# Patient Record
Sex: Male | Born: 1940
Health system: Southern US, Community
[De-identification: ages and names within clinical notes are randomized; demographics above are authoritative.]

## PROBLEM LIST (undated history)

## (undated) DIAGNOSIS — I10 Essential (primary) hypertension: Secondary | ICD-10-CM

## (undated) DIAGNOSIS — R002 Palpitations: Secondary | ICD-10-CM

## (undated) DIAGNOSIS — R51 Headache: Secondary | ICD-10-CM

## (undated) DIAGNOSIS — F0390 Unspecified dementia without behavioral disturbance: Secondary | ICD-10-CM

## (undated) DIAGNOSIS — N189 Chronic kidney disease, unspecified: Secondary | ICD-10-CM

## (undated) DIAGNOSIS — Z950 Presence of cardiac pacemaker: Secondary | ICD-10-CM

## (undated) DIAGNOSIS — H7090 Unspecified mastoiditis, unspecified ear: Secondary | ICD-10-CM

## (undated) DIAGNOSIS — I209 Angina pectoris, unspecified: Secondary | ICD-10-CM

## (undated) DIAGNOSIS — M1990 Unspecified osteoarthritis, unspecified site: Secondary | ICD-10-CM

## (undated) DIAGNOSIS — I519 Heart disease, unspecified: Secondary | ICD-10-CM

## (undated) DIAGNOSIS — I499 Cardiac arrhythmia, unspecified: Secondary | ICD-10-CM

## (undated) HISTORY — PX: CYSTECTOMY: SUR359

## (undated) HISTORY — DX: Palpitations: R00.2

## (undated) HISTORY — PX: FINGER SURGERY: SHX640

## (undated) HISTORY — PX: NASAL SINUS SURGERY: SHX719

---

## 2011-04-24 ENCOUNTER — Other Ambulatory Visit: Payer: Self-pay | Admitting: Otolaryngology

## 2011-04-24 DIAGNOSIS — H7091 Unspecified mastoiditis, right ear: Secondary | ICD-10-CM

## 2011-05-02 ENCOUNTER — Ambulatory Visit
Admission: RE | Admit: 2011-05-02 | Discharge: 2011-05-02 | Disposition: A | Discharge status: Home / Self Care | Payer: Medicare HMO | Source: Ambulatory Visit | Attending: Otolaryngology | Admitting: Otolaryngology

## 2011-05-02 DIAGNOSIS — H7091 Unspecified mastoiditis, right ear: Secondary | ICD-10-CM

## 2011-05-02 MED ORDER — IOHEXOL 300 MG/ML  SOLN
75.0000 mL | Freq: Once | INTRAMUSCULAR | Status: AC | PRN
  Administered 2011-05-02: 75 mL via INTRAVENOUS

## 2011-05-13 ENCOUNTER — Encounter (HOSPITAL_COMMUNITY): Payer: Self-pay | Admitting: Pharmacy Technician

## 2011-05-15 ENCOUNTER — Encounter (HOSPITAL_COMMUNITY)
Admission: RE | Admit: 2011-05-15 | Discharge: 2011-05-15 | Disposition: A | Discharge status: Home / Self Care | Payer: Medicare HMO | Source: Ambulatory Visit | Attending: Otolaryngology | Admitting: Otolaryngology

## 2011-05-15 ENCOUNTER — Encounter (HOSPITAL_COMMUNITY): Payer: Self-pay

## 2011-05-15 ENCOUNTER — Ambulatory Visit (HOSPITAL_COMMUNITY)
Admission: RE | Admit: 2011-05-15 | Discharge: 2011-05-15 | Disposition: A | Discharge status: Home / Self Care | Payer: Medicare HMO | Source: Ambulatory Visit | Attending: Otolaryngology | Admitting: Otolaryngology

## 2011-05-15 DIAGNOSIS — I209 Angina pectoris, unspecified: Secondary | ICD-10-CM | POA: Insufficient documentation

## 2011-05-15 DIAGNOSIS — I77819 Aortic ectasia, unspecified site: Secondary | ICD-10-CM | POA: Insufficient documentation

## 2011-05-15 DIAGNOSIS — I10 Essential (primary) hypertension: Secondary | ICD-10-CM | POA: Insufficient documentation

## 2011-05-15 DIAGNOSIS — E119 Type 2 diabetes mellitus without complications: Secondary | ICD-10-CM | POA: Insufficient documentation

## 2011-05-15 DIAGNOSIS — Z01818 Encounter for other preprocedural examination: Secondary | ICD-10-CM | POA: Insufficient documentation

## 2011-05-15 HISTORY — DX: Headache: R51

## 2011-05-15 HISTORY — DX: Angina pectoris, unspecified: I20.9

## 2011-05-15 HISTORY — DX: Essential (primary) hypertension: I10

## 2011-05-15 HISTORY — DX: Unspecified osteoarthritis, unspecified site: M19.90

## 2011-05-15 HISTORY — DX: Unspecified mastoiditis, unspecified ear: H70.90

## 2011-05-15 HISTORY — DX: Cardiac arrhythmia, unspecified: I49.9

## 2011-05-15 LAB — CBC
HCT: 45.7 % (ref 39.0–52.0)
Hemoglobin: 15.5 g/dL (ref 13.0–17.0)
MCH: 26 pg (ref 26.0–34.0)
MCHC: 33.9 g/dL (ref 30.0–36.0)
MCV: 76.7 fL — ABNORMAL LOW (ref 78.0–100.0)
Platelets: 282 10*3/uL (ref 150–400)
RBC: 5.96 MIL/uL — ABNORMAL HIGH (ref 4.22–5.81)
RDW: 14.2 % (ref 11.5–15.5)
WBC: 6.7 10*3/uL (ref 4.0–10.5)

## 2011-05-15 LAB — DIFFERENTIAL
Basophils Absolute: 0 10*3/uL (ref 0.0–0.1)
Basophils Relative: 1 % (ref 0–1)
Eosinophils Absolute: 0.1 K/uL (ref 0.0–0.7)
Eosinophils Relative: 1 % (ref 0–5)
Lymphocytes Relative: 37 % (ref 12–46)
Lymphs Abs: 2.5 K/uL (ref 0.7–4.0)
Monocytes Absolute: 0.6 K/uL (ref 0.1–1.0)
Monocytes Relative: 9 % (ref 3–12)
Neutro Abs: 3.6 10*3/uL (ref 1.7–7.7)
Neutrophils Relative %: 53 % (ref 43–77)

## 2011-05-15 LAB — BASIC METABOLIC PANEL
CO2: 29 mEq/L (ref 19–32)
Calcium: 9.2 mg/dL (ref 8.4–10.5)
Chloride: 102 mEq/L (ref 96–112)
GFR calc Af Amer: >90 mL/min (ref >90)
Sodium: 140 mEq/L (ref 135–145)

## 2011-05-15 LAB — SURGICAL PCR SCREEN
MRSA, PCR: NEGATIVE
Staphylococcus aureus: NEGATIVE

## 2011-05-15 NOTE — Progress Notes (Signed)
Patient went to Dr. Einar Gip because of recent chest pain. Denies prior hx of chest pain. Stress test done on 05/09/11 and ECHO done 05/14/11. Patient states EKG was done also and that he has been cleared for OR and does not have to go back to Dr. Einar Gip until 05/22/11.   Above tests, office note and EKG requested (spoke with  Jeani Hawking) from Dr. Irven Shelling office.  Spoke with Margie Billet PA about this and she stated she did not need to see patient.  RN will review EKG/records when received to evaluate if Ebony Hail needs to see those or not.

## 2011-05-15 NOTE — Pre-Procedure Instructions (Signed)
Ricky Barnett  05/15/2011   Your procedure is scheduled on:  05/19/11  Report to [facility] at 8:30 AM.  Call this number if you have problems the morning of surgery: [phone]   Remember:   Do not eat food:After Midnight.  May have clear liquids: up to 4 Hours before arrival (4:30 AM).  Clear liquids include soda, tea, black coffee, apple or grape juice, broth.  Take these medicines the morning of surgery with A SIP OF WATER: Tamulosin, Nifedipine   Do not wear jewelry, make-up or nail polish.  Do not wear lotions, powders, or perfumes. You may wear deodorant.  Do not shave 48 hours prior to surgery.  Do not bring valuables to the hospital.  Contacts, dentures or bridgework may not be worn into surgery.  Leave suitcase in the car. After surgery it may be brought to your room.  For patients admitted to the hospital, checkout time is 11:00 AM the day of discharge.   Patients discharged the day of surgery will not be allowed to drive home.  Name and phone number of your driver: being admitted.  Special Instructions: CHG Shower Use Special Wash: 1/2 bottle night before surgery and 1/2 bottle morning of surgery.   Please read over the following fact sheets that you were given: Pain Booklet, Coughing and Deep Breathing, MRSA Information and Surgical Site Infection Prevention

## 2011-05-16 NOTE — Consult Note (Addendum)
Anesthesia:  Patient is a 71 year old male scheduled for a right mastoid tympanoplasty on 05/19/11.  History includes recent chest pain negative work-up, DM2, HTN, HLD, headaches, non-smoker, dysrhythmia.  PCP is Dr. Merrilee Seashore.  He was evaluated by Dr. Orion Crook on 05/07/11 for abnormal EKG showing SR with marked first degree AVB with history of CP.  An exercise stress test was done and was negative for ischemia and ultimately he was felt acceptable risk for this procedure.   According to Dr. Irven Shelling note, Mr. Coody also had an echo in March 2011 that showed a normal EF, mild LVH, Mild AI/TR.  I've asked our secretary to attempt to get a copy of this report as well.  Pre-op CXR showed: Aorta ectasia and findings compatible with prior granulomatous disease. No new focal or acute abnormality suggested.  Labs noted.  Plan to proceed if no acute changes.

## 2011-05-17 NOTE — H&P (Signed)
Ricky Barnett, Ricky Barnett NO.:  [id number]  MEDICAL RECORD NO.:  [id number]  LOCATION:  [facility]                          FACILITY:  [facility]  PHYSICIAN:  Minna Merritts, M.D.   DATE OF BIRTH:  1940-05-06  DATE OF ADMISSION:  05/15/2011 DATE OF DISCHARGE:  05/15/2011                             HISTORY & PHYSICAL   HISTORY OF PRESENT ILLNESS:  This patient is a 71 year old male with a history of well-controlled diabetes.  He does have hypertension, hyperlipidemia, and has a history of a right tympanic membrane perforation.  He has had a preoperatively evaluation revealed a marked first-degree AV block, and we have canceled the surgery considering for further cardiovascular evaluation.  He did have the abnormal EKG, prolonged first-degree but no ischemia.  He had a carotid Doppler on March 11, no significant carotid stenosis.  Echo on March 11, normal left ventricular status, mild left ventricular hypertrophy, mild AI and TR, hypertension 180/94, aortic regurgitation by physical examination. The patient has been evaluated by Dr. Christen Butter, and he has a normal heart rate response on a stress test on May 08, 2011.  He also has 5 minutes on the Bruce, achieved 7 METS and has been stated to have an acceptable CV risk for his right mastoid tympanoplasty.  He has had previous sinus surgery in the past in [place] and did very well, but on my examination today, his sinuses are quite good condition, but he has a very large tympanic membrane perforation with a hearing loss that is showing a 35 decibel speech reception threshold with 92% discrimination score with a primarily conductive loss typical of this tympanic membrane perforation that represents probably 70-80% of the right tympanic membrane.  The mastoid films looked quite good where they saw a normal right temporal bone except for some minimal to mild mucosal thickening and fluid in the inferior right mastoid.  There was no  soft tissue inflammation, the ossicular chain was stated to be not abnormal. He also had a normal left temporal bone.  His past diagnoses have been one of hyperlipidemia, essential hypertension, benign prostate hypertrophy, osteoarthritis, fatigue.  MEDICATIONS:  Are listed: 1. __________ 510 once a day. 2. Covastin 40 once a day. 3. Tamsulosin HCL 0.4 mg capsule 30 minutes after meals. 4. Meclizine 25 p.r.n. 2 tablets 3 times a day for vertigo. 5. He has been on Cortisporin, we switched him over to Ciprodex otic     suspension. 6. He has also been on antibiotics under my care.  We will to try to     improve this draining ear which we  have done quite well.  REVIEW OF SYSTEMS:  Showing a pressure sensation which is typical of a right tympanic membrane perforation on right ear, but he has some soreness in musculoskeletal but otherwise psychiatric, allergic, hematologic, neurologic, integument, neuro, GU, and GI are all within normal limits.  He did have an occasional vertigo which I think is from the coolness of the middle ear when he is especially using the windy environments which is quite classic for a large tympanic membrane perforation.  A small amount of the malleus was exposed.  The  CAT scan did not show this but I feel there is some of the malleus has been eroded.  There is no right mastoid pain.  The nasopharynx and oropharynx are clear.  The nose looks to be free of infection.  The larynx is clear. True cords, false cords, epiglottis, base of tongue, lateral pharyngeal walls are clear.  True cord mobility, gag reflex, tongue mobility, EOMs, facial nerve, shoulder strength are also clear, and his neck is free of any thyromegaly, cervical adenopathy, or mass.  She is oriented x3. Chest is clear.  No rales, rhonchi, or wheezes.  His chest x-ray showed some aortic ectasia with findings compatible of prior granulomatous disease.  No new focal abnormalities shown.   Bony structures also shows some degenerative osteophytosis mid to lower thoracic spine.  From the diagnosis here is one of the  right tympanic membrane perforation with concern of right mastoiditis, history of primary AV block, history of hyperlipidemia, essential hypertension of 150/90 on my exam, benign prostatic hypertrophy, and osteoarthritis, history of sinusitis, history of aortic regurgitation and that has been cleared of cardiac abnormalities of meriting a reasonable risk for general endotracheal anesthesia.          ______________________________ Minna Merritts, M.D.     JC/MEDQ  D:  05/16/2011  T:  05/16/2011  Job:  [id number]  cc:   Laverda Page, MD Merrilee Seashore, M.D.

## 2011-05-19 ENCOUNTER — Other Ambulatory Visit: Payer: Self-pay

## 2011-05-19 ENCOUNTER — Ambulatory Visit (HOSPITAL_COMMUNITY)
Admission: RE | Admit: 2011-05-19 | Discharge: 2011-05-20 | Disposition: A | Discharge status: Home / Self Care | Payer: Medicare HMO | Source: Ambulatory Visit | Attending: Otolaryngology | Admitting: Otolaryngology

## 2011-05-19 ENCOUNTER — Encounter (HOSPITAL_COMMUNITY): Payer: Self-pay | Admitting: Vascular Surgery

## 2011-05-19 ENCOUNTER — Encounter (HOSPITAL_COMMUNITY)
Admission: RE | Disposition: A | Discharge status: Home / Self Care | Payer: Self-pay | Source: Ambulatory Visit | Attending: Otolaryngology

## 2011-05-19 ENCOUNTER — Encounter (HOSPITAL_COMMUNITY): Payer: Self-pay | Admitting: Surgery

## 2011-05-19 ENCOUNTER — Ambulatory Visit (HOSPITAL_COMMUNITY): Payer: Medicare HMO | Admitting: Vascular Surgery

## 2011-05-19 ENCOUNTER — Encounter (HOSPITAL_COMMUNITY): Payer: Self-pay

## 2011-05-19 DIAGNOSIS — H7090 Unspecified mastoiditis, unspecified ear: Secondary | ICD-10-CM | POA: Insufficient documentation

## 2011-05-19 DIAGNOSIS — E119 Type 2 diabetes mellitus without complications: Secondary | ICD-10-CM | POA: Insufficient documentation

## 2011-05-19 DIAGNOSIS — I499 Cardiac arrhythmia, unspecified: Secondary | ICD-10-CM | POA: Insufficient documentation

## 2011-05-19 DIAGNOSIS — E785 Hyperlipidemia, unspecified: Secondary | ICD-10-CM | POA: Insufficient documentation

## 2011-05-19 DIAGNOSIS — I10 Essential (primary) hypertension: Secondary | ICD-10-CM | POA: Insufficient documentation

## 2011-05-19 DIAGNOSIS — R51 Headache: Secondary | ICD-10-CM | POA: Insufficient documentation

## 2011-05-19 DIAGNOSIS — H902 Conductive hearing loss, unspecified: Secondary | ICD-10-CM | POA: Insufficient documentation

## 2011-05-19 DIAGNOSIS — Z23 Encounter for immunization: Secondary | ICD-10-CM | POA: Insufficient documentation

## 2011-05-19 DIAGNOSIS — H7290 Unspecified perforation of tympanic membrane, unspecified ear: Secondary | ICD-10-CM | POA: Insufficient documentation

## 2011-05-19 DIAGNOSIS — I440 Atrioventricular block, first degree: Secondary | ICD-10-CM | POA: Insufficient documentation

## 2011-05-19 HISTORY — PX: TYMPANOPLASTY: SHX33

## 2011-05-19 LAB — GLUCOSE, CAPILLARY
Glucose-Capillary: 108 mg/dL — ABNORMAL HIGH (ref 70–99)
Glucose-Capillary: 135 mg/dL — ABNORMAL HIGH (ref 70–99)
Glucose-Capillary: 125 mg/dL — ABNORMAL HIGH (ref 70–99)

## 2011-05-19 SURGERY — TYMPANOPLASTY
Anesthesia: General | Site: Ear | Laterality: Right | Wound class: Clean Contaminated

## 2011-05-19 MED ORDER — PROMETHAZINE HCL 25 MG/ML IJ SOLN: 6.2500 mg | INTRAMUSCULAR | Status: DC | PRN

## 2011-05-19 MED ORDER — FENTANYL CITRATE 0.05 MG/ML IJ SOLN
INTRAMUSCULAR | Status: DC | PRN
  Administered 2011-05-19: 100 ug via INTRAVENOUS

## 2011-05-19 MED ORDER — CEFAZOLIN SODIUM 1-5 GM-% IV SOLN
1.0000 g | Freq: Three times a day (TID) | INTRAVENOUS | Status: DC
  Administered 2011-05-19 – 2011-05-20 (×3): 1 g via INTRAVENOUS
  Filled 2011-05-19 (×5): qty 50

## 2011-05-19 MED ORDER — EPHEDRINE SULFATE 50 MG/ML IJ SOLN
INTRAMUSCULAR | Status: DC | PRN
  Administered 2011-05-19 (×2): 5 mg via INTRAVENOUS

## 2011-05-19 MED ORDER — ACETAMINOPHEN 325 MG PO TABS
325.0000 mg | ORAL_TABLET | ORAL | Status: DC | PRN
  Administered 2011-05-20: 650 mg via ORAL
  Filled 2011-05-19 (×2): qty 2

## 2011-05-19 MED ORDER — EPINEPHRINE HCL (NASAL) 0.1 % NA SOLN
NASAL | Status: DC | PRN
  Administered 2011-05-19: 2 [drp] via TOPICAL

## 2011-05-19 MED ORDER — MIDAZOLAM HCL 2 MG/2ML IJ SOLN: 0.5000 mg | Freq: Once | INTRAMUSCULAR | Status: DC | PRN

## 2011-05-19 MED ORDER — CEFAZOLIN SODIUM 1-5 GM-% IV SOLN
INTRAVENOUS | Status: DC | PRN
  Administered 2011-05-19: 1 g via INTRAVENOUS

## 2011-05-19 MED ORDER — SODIUM CHLORIDE 0.9 % IR SOLN: Status: DC | PRN

## 2011-05-19 MED ORDER — CIPROFLOXACIN-DEXAMETHASONE 0.3-0.1 % OT SUSP
OTIC | Status: DC | PRN
  Administered 2011-05-19: 2 [drp] via OTIC

## 2011-05-19 MED ORDER — BACIT-POLY-NEO HC 1 % EX OINT
TOPICAL_OINTMENT | CUTANEOUS | Status: DC | PRN
  Administered 2011-05-19: 1

## 2011-05-19 MED ORDER — MEPERIDINE HCL 25 MG/ML IJ SOLN: 6.2500 mg | INTRAMUSCULAR | Status: DC | PRN

## 2011-05-19 MED ORDER — LISINOPRIL 20 MG PO TABS
20.0000 mg | ORAL_TABLET | Freq: Every day | ORAL | Status: DC
  Administered 2011-05-19 – 2011-05-20 (×2): 20 mg via ORAL
  Filled 2011-05-19 (×2): qty 1

## 2011-05-19 MED ORDER — ACETAMINOPHEN-CODEINE 120-12 MG/5ML PO SOLN
15.0000 mL | ORAL | Status: DC | PRN
  Administered 2011-05-19: 15 mL via ORAL

## 2011-05-19 MED ORDER — METHYLENE BLUE 1 % INJ SOLN
INTRAMUSCULAR | Status: DC | PRN
  Administered 2011-05-19: 1 mL

## 2011-05-19 MED ORDER — SODIUM CHLORIDE 0.9 % IV SOLN
INTRAVENOUS | Status: DC
  Administered 2011-05-19: 15:00:00 via INTRAVENOUS

## 2011-05-19 MED ORDER — HEMOSTATIC AGENTS (NO CHARGE) OPTIME
TOPICAL | Status: DC | PRN
  Administered 2011-05-19: 1 via TOPICAL

## 2011-05-19 MED ORDER — INFLUENZA VIRUS VACC SPLIT PF IM SUSP
0.5000 mL | Freq: Once | INTRAMUSCULAR | Status: AC
  Administered 2011-05-20: 0.5 mL via INTRAMUSCULAR
  Filled 2011-05-19 (×2): qty 0.5

## 2011-05-19 MED ORDER — SODIUM CHLORIDE 0.9 % IR SOLN
Status: DC | PRN
  Administered 2011-05-19: 1000 mL

## 2011-05-19 MED ORDER — ONDANSETRON HCL 4 MG/2ML IJ SOLN: 4.0000 mg | INTRAMUSCULAR | Status: DC | PRN

## 2011-05-19 MED ORDER — CEFAZOLIN SODIUM 1-5 GM-% IV SOLN
INTRAVENOUS | Status: AC
  Filled 2011-05-19: qty 50

## 2011-05-19 MED ORDER — KETOROLAC TROMETHAMINE 30 MG/ML IJ SOLN
15.0000 mg | Freq: Once | INTRAMUSCULAR | Status: AC | PRN
  Administered 2011-05-19: 30 mg via INTRAVENOUS
  Filled 2011-05-19: qty 1

## 2011-05-19 MED ORDER — CLINDAMYCIN HCL 150 MG PO CAPS
150.0000 mg | ORAL_CAPSULE | Freq: Three times a day (TID) | ORAL | Status: DC
  Administered 2011-05-19 – 2011-05-20 (×4): 150 mg via ORAL
  Filled 2011-05-19 (×6): qty 1

## 2011-05-19 MED ORDER — ONDANSETRON HCL 4 MG/2ML IJ SOLN
INTRAMUSCULAR | Status: DC | PRN
  Administered 2011-05-19: 4 mg via INTRAVENOUS

## 2011-05-19 MED ORDER — LIDOCAINE-EPINEPHRINE 1 %-1:100000 IJ SOLN
INTRAMUSCULAR | Status: DC | PRN
  Administered 2011-05-19: 3.8 mL

## 2011-05-19 MED ORDER — FENTANYL CITRATE 0.05 MG/ML IJ SOLN: 25.0000 ug | INTRAMUSCULAR | Status: DC | PRN

## 2011-05-19 MED ORDER — PROPOFOL 10 MG/ML IV EMUL
INTRAVENOUS | Status: DC | PRN
  Administered 2011-05-19: 200 mg via INTRAVENOUS

## 2011-05-19 MED ORDER — TAMSULOSIN HCL 0.4 MG PO CAPS
0.4000 mg | ORAL_CAPSULE | Freq: Every day | ORAL | Status: DC
  Administered 2011-05-19: 0.4 mg via ORAL
  Filled 2011-05-19 (×2): qty 1

## 2011-05-19 MED ORDER — LACTATED RINGERS IV SOLN
INTRAVENOUS | Status: DC | PRN
  Administered 2011-05-19 (×2): via INTRAVENOUS

## 2011-05-19 MED ORDER — ONDANSETRON HCL 4 MG PO TABS: 4.0000 mg | ORAL_TABLET | ORAL | Status: DC | PRN

## 2011-05-19 MED ORDER — GLYCOPYRROLATE 0.2 MG/ML IJ SOLN
INTRAMUSCULAR | Status: DC | PRN
  Administered 2011-05-19: 0.1 mg via INTRAVENOUS

## 2011-05-19 MED ORDER — LIDOCAINE HCL (CARDIAC) 20 MG/ML IV SOLN
INTRAVENOUS | Status: DC | PRN
  Administered 2011-05-19: 60 mg via INTRAVENOUS

## 2011-05-19 MED ORDER — BENAZEPRIL HCL 10 MG PO TABS
10.0000 mg | ORAL_TABLET | Freq: Every day | ORAL | Status: DC
  Administered 2011-05-19 – 2011-05-20 (×2): 10 mg via ORAL
  Filled 2011-05-19 (×2): qty 1

## 2011-05-19 MED ORDER — NIFEDIPINE ER OSMOTIC RELEASE 90 MG PO TB24
90.0000 mg | ORAL_TABLET | Freq: Every day | ORAL | Status: DC
  Administered 2011-05-19 – 2011-05-20 (×2): 90 mg via ORAL
  Filled 2011-05-19 (×2): qty 1

## 2011-05-19 SURGICAL SUPPLY — 46 items
BANDAGE CONFORM 3  STR LF (GAUZE/BANDAGES/DRESSINGS) IMPLANT
CANISTER SUCTION 2500CC (MISCELLANEOUS) ×2 IMPLANT
CLOTH BEACON ORANGE TIMEOUT ST (SAFETY) ×2 IMPLANT
COTTONBALL LRG STERILE PKG (GAUZE/BANDAGES/DRESSINGS) ×2 IMPLANT
COVER SURGICAL LIGHT HANDLE (MISCELLANEOUS) ×2 IMPLANT
DECANTER SPIKE VIAL GLASS SM (MISCELLANEOUS) ×2 IMPLANT
DRAPE EENT ADH APERT 31X51 STR (DRAPES) ×2 IMPLANT
DRAPE MICROSCOPE LEICA 54X105 (DRAPE) ×2 IMPLANT
DRAPE POUCH INSTRU U-SHP 10X18 (DRAPES) ×2 IMPLANT
ELECT COATED BLADE 2.86 ST (ELECTRODE) ×2 IMPLANT
ELECT REM PT RETURN 9FT ADLT (ELECTROSURGICAL) ×2
ELECTRODE REM PT RTRN 9FT ADLT (ELECTROSURGICAL) ×1 IMPLANT
GLOVE BIO SURGEON STRL SZ 6 (GLOVE) ×2 IMPLANT
GLOVE BIOGEL PI IND STRL 6.5 (GLOVE) ×1 IMPLANT
GLOVE BIOGEL PI INDICATOR 6.5 (GLOVE) ×1
GLOVE SURG SS PI 6.5 STRL IVOR (GLOVE) ×2 IMPLANT
GLOVE SURG SS PI 7.5 STRL IVOR (GLOVE) ×2 IMPLANT
GOWN PREVENTION PLUS XLARGE (GOWN DISPOSABLE) ×2 IMPLANT
GOWN STRL NON-REIN LRG LVL3 (GOWN DISPOSABLE) ×2 IMPLANT
KIT BASIN OR (CUSTOM PROCEDURE TRAY) ×2 IMPLANT
KIT ROOM TURNOVER OR (KITS) ×2 IMPLANT
NEEDLE 25GX 5/8IN NON SAFETY (NEEDLE) ×2 IMPLANT
NEEDLE 27GAX1X1/2 (NEEDLE) ×2 IMPLANT
NEEDLE HYPO 25GX1X1/2 BEV (NEEDLE) ×2 IMPLANT
NS IRRIG 1000ML POUR BTL (IV SOLUTION) ×2 IMPLANT
PAD ARMBOARD 7.5X6 YLW CONV (MISCELLANEOUS) ×4 IMPLANT
PENCIL BUTTON HOLSTER BLD 10FT (ELECTRODE) ×2 IMPLANT
SPECIMEN JAR SMALL (MISCELLANEOUS) ×2 IMPLANT
SPONGE GAUZE 4X4 12PLY (GAUZE/BANDAGES/DRESSINGS) IMPLANT
SPONGE SURGIFOAM ABS GEL 12-7 (HEMOSTASIS) ×2 IMPLANT
STAPLER VISISTAT 35W (STAPLE) IMPLANT
STRIP CLOSURE SKIN 1/2X4 (GAUZE/BANDAGES/DRESSINGS) ×2 IMPLANT
SUT CHROMIC 2 0 SH (SUTURE) IMPLANT
SUT CHROMIC 3 0 PS 2 (SUTURE) ×2 IMPLANT
SUT CHROMIC 3 0 SH 27 (SUTURE) ×2 IMPLANT
SUT ETHILON 5 0 P 3 18 (SUTURE)
SUT NYLON ETHILON 5-0 P-3 1X18 (SUTURE) IMPLANT
SYR 3ML LL SCALE MARK (SYRINGE) ×2 IMPLANT
SYR TB 1ML LUER SLIP (SYRINGE) ×4 IMPLANT
TOWEL OR 17X24 6PK STRL BLUE (TOWEL DISPOSABLE) ×2 IMPLANT
TOWEL OR 17X26 10 PK STRL BLUE (TOWEL DISPOSABLE) ×2 IMPLANT
TRAY ENT MC OR (CUSTOM PROCEDURE TRAY) ×2 IMPLANT
TUBING EXTENTION W/L.L. (IV SETS) IMPLANT
TUBING IRRIGATION (MISCELLANEOUS) IMPLANT
WATER STERILE IRR 1000ML POUR (IV SOLUTION) IMPLANT
WIPE INSTRUMENT VISIWIPE 73X73 (MISCELLANEOUS) ×2 IMPLANT

## 2011-05-19 NOTE — Progress Notes (Signed)
Pt admitted to 5152 from PACU. Dx: S/P right tympanoplasty. Oriented to room. Call bell in reach. IV fluids infusing. Wife at bedside. Dr. Ernesto Rutherford in to evaluate drainage from right ear. Noted small of cotton ball soaked with serous drainage. Clean cotton ball replaced. Ice pack to operative side.

## 2011-05-19 NOTE — Progress Notes (Signed)
EKG results faxed to Dr Einar Gip.

## 2011-05-19 NOTE — Anesthesia Procedure Notes (Signed)
Procedure Name: LMA Insertion Date/Time: 05/19/2011 7:43 AM Performed by: Neldon Newport Pre-anesthesia Checklist: Emergency Drugs available, Patient identified, Timeout performed, Suction available and Patient being monitored Patient Re-evaluated:Patient Re-evaluated prior to inductionOxygen Delivery Method: Circle system utilized Preoxygenation: Pre-oxygenation with 100% oxygen Intubation Type: IV induction Ventilation: Mask ventilation without difficulty LMA: LMA inserted LMA Size: 5.0 Placement Confirmation: positive ETCO2 and breath sounds checked- equal and bilateral Tube secured with: Tape Dental Injury: Teeth and Oropharynx as per pre-operative assessment

## 2011-05-19 NOTE — Op Note (Signed)
NAME:  Ricky Barnett, Ricky Barnett NO.:  [id number]  MEDICAL RECORD NO.:  [id number]  LOCATION:  [facility]                         FACILITY:  [facility]  PHYSICIAN:  Minna Merritts, M.D.   DATE OF BIRTH:  [birth year]  DATE OF PROCEDURE: DATE OF DISCHARGE:                              OPERATIVE REPORT   The patient has a severe right tympanic membrane perforation representing 80-90% of his tympanic membrane with a conductive hearing loss that is showing a 35 dB speech reception threshold with more frequencies at 250 get down to 50 dB and at 1000 showing 40 dB loss with a 92% discrimination score.  His CAT scan showed some mastoid changes in the tail or tip of the mastoid inferiorly but showed no evidence of any cholesteatomatous debris.  Our plan is permission for a mastoid tympanoplasty on the right, but hopefully we can accomplish our goal of doing a tympanoplasty we do not find cholesteatoma in the attic.  He is aware of the risks and gains, aware that he can wake up with some vertigo or have facial nerve issues considering the amount of damage seen on this middle ear.  His tympanic membrane is about 90% absent but the ossicular chain appears to be intact on CAT scan and on physical examination.  PREOPERATIVE DIAGNOSIS:  Right mastoiditis with tympanic membrane perforation 90% with probable ossicular chain intact.  POSTOPERATIVE DIAGNOSIS:  Right mastoiditis with tympanic membrane perforation 90% with probable ossicular chain intact.  OPERATION:  Right exploratory tympanotomy with tympanoplasty.  We were able to avoid the mastoid.  OPERATOR:  Minna Merritts, MD  ANESTHESIA:  General endotracheal with Dr. Leonie Green.  PROCEDURE:  The patient was placed in supine position under general endotracheal anesthesia.  The head was turned to the left.  The right ear being marked and the patient was prepped with Betadine x3.  The usual head drape was used and ear drape, and then  again we cleansed the ear very carefully under the microscope after prepping and draping completely.  The tympanic membrane was essentially absent except around the malleus, and we were able to first evaluate the external ear canal. We decided that there was so much damage here that we would not do a typical exploratory tympanotomy, but that we would pull the what was left of the canal wall skin laterally and toward [place] in an effort to save as much as we possibly could, and therefore in a circumferential area right from the posterior canal wall we retracted the skin towards [place] rolling it back exposing the posterior canal wall.  We did this inferiorly as well.  As we worked around anteriorly, we could see anteriorly quite well and retracted this skin also laterally.  We then came to the scutum anterior portion where the ossicular chain was covered with what was left of the tympanic membrane, and we were able to tease this back very carefully.  We watched the backside of what was left of the skin but we did not see any cholesteatoma up in the attic and antrum region.  We cleaned back this what was left of the tympanic membrane back to the head of  the malleus and back to the entire incus and found that the ossicular chain was in fact intact.  The umbo of the malleus had been somewhat eroded but the major portion was still intact. The middle ear appeared to be in quite decent condition.  The mucous membrane was in reasonably good condition.  We felt with the damage extensive as it was we would use every bit of that canal wall skin to support the temporalis fascia graft.  The fact that we did not find a cholesteatoma up by the attic and antrum region or in the posterior facial recess region, we felt that we did not have to do any further resection in that area.  He did have the SIMS monitor on as a safety factor.  We then did a postauricular incision and took a fairly large temporalis fascia  graft, and then after hemostasis was established with Bovie electrocoagulation, the closure was with 3-0 chromic catgut.  This graft was then sized and adjusted.  A large piece of Gelfoam was placed within the middle ear to support the graft but not to push it beyond the level of the malleus.  We then folded the graft carefully in an effort to place it into the middle ear location without pushing any of the retracted canal wall skin down under the graft.  We were able to place the graft all away around the circumferential area of the external ear canal up the wall of the anterior canal draping over the malleus up over the scutum and posteriorly.  We then placed the canal wall skin that was retracted back all over and back down over the grafted area.  Once this was completed, we felt this was an excellent approximation, the tympanic membrane being intact this is now the new temporalis graft, and the posterior, anterior, inferior and superior canal wall skin was back in its original position.  Once this was completed, Gelfoam again with Ciprodex drops were then placed in the external ear canal followed by Neosporin ointment followed by cotton in the external ear canal, and the patient has done very well as being awakened.  He had a first-degree heart block and did go into second-degree heart block.  There was no problem with his blood pressure or pulse.  He then once the anesthesia was discontinued, the patient returned back to first-degree heart block. He will be kept on a 23 hour observation but has done extremely well.          ______________________________ Minna Merritts, M.D.     JC/MEDQ  D:  05/19/2011  T:  05/19/2011  Job:  [id number]  cc:   Laverda Page, MD Merrilee Seashore, M.D.

## 2011-05-19 NOTE — Preoperative (Signed)
Beta Blockers   Reason not to administer Beta Blockers:Not Applicable 

## 2011-05-19 NOTE — Progress Notes (Signed)
Dr. Nadyne Coombes in to see patient. Aware of plan to transfer to telemetry. Aware of EKG results.

## 2011-05-19 NOTE — H&P (Signed)
  Dictation [phone]

## 2011-05-19 NOTE — Progress Notes (Signed)
Post op check patient doing well for otology status                       However  I reviewed EKG with Dr Nadyne Coombes who is                   Concerned about continued second degree heart block  Plan to transfer to telemetry

## 2011-05-19 NOTE — Anesthesia Postprocedure Evaluation (Signed)
Anesthesia Post Note  Patient: Ricky Barnett  Procedure(s) Performed: Procedure(s) (LRB): TYMPANOPLASTY (Right)  Anesthesia type: GA  Patient location: PACU  Post pain: Pain level controlled  Post assessment: Post-op Vital signs reviewed  Last Vitals:  Filed Vitals:   05/19/11 0945  BP: 142/83  Pulse: 87  Temp:   Resp: 24    Post vital signs: Reviewed  Level of consciousness: sedated  Complications: No apparent anesthesia complications   Intra operative dysrythmia observed.  Patient stable through entire case.  Second degree Mobitz 1 found on post op EKG.  Patient stable.  Discussed with surgeon who plans to relay information to cardiologist for further recommendations.

## 2011-05-19 NOTE — Anesthesia Preprocedure Evaluation (Addendum)
Anesthesia Evaluation  Patient identified by MRN, date of birth, ID band Patient awake    Airway Mallampati: II TM Distance: >3 FB Neck ROM: full    Dental  (+) Dental Advidsory Given and Loose   Pulmonary          Cardiovascular hypertension, Pt. on medications and On Medications + dysrhythmias     Neuro/Psych  Headaches,    GI/Hepatic   Endo/Other  Diabetes mellitus-  Renal/GU      Musculoskeletal   Abdominal   Peds  Hematology   Anesthesia Other Findings Loose upper in back teeth Stress test and echo reviewed  Reproductive/Obstetrics                          Anesthesia Physical Anesthesia Plan  ASA: II  Anesthesia Plan: General LMA   Post-op Pain Management:    Induction:   Airway Management Planned:   Additional Equipment:   Intra-op Plan:   Post-operative Plan:   Informed Consent:   Dental Advisory Given  Plan Discussed with: Anesthesiologist, CRNA and Surgeon  Anesthesia Plan Comments:        Anesthesia Quick Evaluation

## 2011-05-19 NOTE — Progress Notes (Signed)
Dr Glennon Mac and Dr Ernesto Rutherford at bedside to evaluate EKG.  Dr Ernesto Rutherford will consult with pt's cardiologist re 2nd degree type 1  HB.  Will wait for further orders.

## 2011-05-19 NOTE — Progress Notes (Signed)
Noted that CBG was not performed on pt in pre-op.  Notified Rhonda in OR the need for CBG.  She verbalized understanding and stated she would let staff know.//L. Delena Casebeer,RN

## 2011-05-19 NOTE — Progress Notes (Signed)
Dr Ernesto Rutherford at bedside.

## 2011-05-19 NOTE — Progress Notes (Signed)
Report called to Department 2000, Vikki Ports RN. Questions answered. Informed of recent EKG.

## 2011-05-19 NOTE — Plan of Care (Signed)
Problem: Consults Goal: Diabetes Guidelines if Diabetic/Glucose > 140 If diabetic or lab glucose is > 140 mg/dl - Initiate Diabetes/Hyperglycemia Guidelines & Document Interventions  Outcome: Completed/Met Date Met:  05/19/11 CBG 125

## 2011-05-19 NOTE — Op Note (Signed)
Dictated [phone]

## 2011-05-19 NOTE — Consult Note (Signed)
CARDIOLOGY CONSULT NOTE  Patient ID: Ricky Barnett MRN: [id number] DOB/AGE: 1940-03-06 71 y.o.  Admit date: 05/19/2011 Referring Physician Lorane Gell, MD Primary Physician: No primary provider on file. Reason for Consultation: Heart block  HPI: This patient is a 70 year old male with a history of well-controlled diabetes. He does have hypertension, hyperlipidemia, and has a history of a right tympanic membrane perforation. He has had a preoperatively evaluation revealed a marked first-degree AV block,and underwent outpatient cardiovascular evaluation. He did have the abnormal EKG,prolonged first-degree but no ischemia. He had a carotid Doppler on March 11, no significant carotid stenosis. Echo on March 11, normal left ventricularEF, mild left ventricular hypertrophy, mild AI,TR, pulmonary hypertension,  And aortic regurgitation by echo and physical exam.  physical examination. He has a normal heart rate response on a stress test on May 08, 2011. He also has 5 minutes on the Bruce, achieved 7 METS and has been stated to have an acceptable CV risk for his right mastoid tympanoplasty.  He underwent surgery today and did well, but post operartive telemetry and EKG showed high degree AV block and I was conculted to see the patient. Essentially asymptomatic and wife at the bedside.   Past Medical History  Diagnosis Date  . Mastoiditis     right ear  . Hypertension   . Dysrhythmia     2-3 yrs ago  . Diabetes mellitus     was at one time lost weight amd no problem with it now. About 2-3 yrs ago  . Headache     occas  . Arthritis     bil knees  . Angina     just started recently ,2-3 days ago-went to see Dr. Einar Gip 05/14/2011     Past Surgical History  Procedure Date  . Nasal sinus surgery     left side  . Cystectomy     under chin 1983  . Finger surgery     states he cut finger off when cutting wood     History reviewed. No pertinent family history.  Social History: History    Social History  . Marital Status: Married    Spouse Name: N/A    Number of Children: N/A  . Years of Education: N/A   Occupational History  . Not on file.   Social History Main Topics  . Smoking status: Never Smoker   . Smokeless tobacco: Never Used  . Alcohol Use: No  . Drug Use: No  . Sexually Active:    Other Topics Concern  . Not on file   Social History Narrative  . No narrative on file     Prescriptions prior to admission  Medication Sig Dispense Refill  . benazepril (LOTENSIN) 10 MG tablet Take 10 mg by mouth daily.      . ciprofloxacin-dexamethasone (CIPRODEX) otic suspension Place 4 drops into the right ear 2 (two) times daily.       . clindamycin (CLEOCIN) 150 MG capsule Take 150 mg by mouth 3 (three) times daily. Take for 10 days. rx. filled 05/02/11      . lisinopril (PRINIVIL,ZESTRIL) 20 MG tablet Take 20 mg by mouth daily.      . meclizine (ANTIVERT) 25 MG tablet Take 25-50 mg by mouth 3 (three) times daily as needed. For dizziness      . neomycin-polymyxin-hydrocortisone (CORTISPORIN) otic solution Place 4 drops into the right ear 4 (four) times daily.       Marland Kitchen NIFEdipine (PROCARDIA XL/ADALAT-CC) 90 MG 24 hr  tablet Take 90 mg by mouth daily.      . Tamsulosin HCl (FLOMAX) 0.4 MG CAPS Take 0.4 mg by mouth daily after supper.        ROS: General: no fevers/chills/night sweats Eyes: no blurry vision, diplopia, or amaurosis ENT: no sore throat or hearing loss Resp: no cough, wheezing, or hemoptysis CV: no edema or palpitations GI: no abdominal pain, nausea, vomiting, diarrhea, or constipation GU: no dysuria, frequency, or hematuria Skin: no rash Neuro: no headache, numbness, tingling, or weakness of extremities Musculoskeletal: no joint pain or swelling Heme: no bleeding, DVT, or easy bruising Endo: no polydipsia or polyuria    Physical Exam: Blood pressure 146/70, pulse 56, temperature 98.1 F (36.7 C), temperature source Oral, resp. rate 18, height 5'  11" (1.803 m), weight 106.641 kg (235 lb 1.6 oz), SpO2 100.00%.  Pt is alert and oriented, WD, WN, in no distress. HEENT: normal Neck: JVP normal. Carotid upstrokes normal without bruits. No thyromegaly. Lungs: equal expansion, clear bilaterally CV: Apex is discrete and nondisplaced,S1, S2 normal, EDM heard in the right parasternal border. Abd: soft, NT, +BS, no bruit, no hepatosplenomegaly Back: no CVA tenderness Ext: no C/C/E        Femoral pulses 2+= without bruits        DP/PT pulses intact and = Skin: warm and dry without rash Neuro: CNII-XII intact             Strength intact = bilaterally  Labs:   Lab Results  Component Value Date   WBC 6.7 05/15/2011   HGB 15.5 05/15/2011   HCT 45.7 05/15/2011   MCV 76.7* 05/15/2011   PLT 282 05/15/2011    Lab 05/15/11 1130  NA 140  K 3.8  CL 102  CO2 29  BUN 10  CREATININE 0.95  CALCIUM 9.2  PROT --  BILITOT --  ALKPHOS --  ALT --  AST --  GLUCOSE 137*      Radiology: No results found.  EKG: Complete Heart block with accelerated junctional escape.  ASSESSMENT AND PLAN:  1. Complete heart block. Patient will be transferred to the telemetry and watched overnight and if the AV conduction is not resolved, I will admit him and probably will need Permanent Transvenous pacemaker implant. I will keep NPO in the morning if we decide to proceed with pacemaker implantation.  Patient is not on any negative chronotropic agents.  Laverda Page, MD 05/19/2011, 8:20 PM

## 2011-05-19 NOTE — Transfer of Care (Signed)
Immediate Anesthesia Transfer of Care Note  Patient: Ricky Barnett  Procedure(s) Performed: Procedure(s) (LRB): TYMPANOPLASTY (Right)  Patient Location: PACU  Anesthesia Type: General  Level of Consciousness: awake and alert   Airway & Oxygen Therapy: Patient Spontanous Breathing  Post-op Assessment: Report given to PACU RN, Post -op Vital signs reviewed and stable and Patient moving all extremities  Post vital signs: Reviewed and stable  Complications: No apparent anesthesia complications

## 2011-05-19 NOTE — Progress Notes (Signed)
Call placed to Dr Ernesto Rutherford Re dng noted from ear canal.  No orders received.

## 2011-05-20 ENCOUNTER — Encounter (HOSPITAL_COMMUNITY): Payer: Self-pay | Admitting: Otolaryngology

## 2011-05-20 NOTE — Progress Notes (Signed)
Pt discharge instructions and patient education complete. IV site d/c. Site WNL. No s/s of distress. D/C home with wife. Ricky Barnett

## 2011-05-20 NOTE — Discharge Summary (Signed)
CARDIOLOGY CONSULT NOTE  Patient ID: MATHEO AMORY MRN: [id number] DOB/AGE: 03/20/1940 71 y.o.  Admit date: 05/19/2011 Referring Physician Lorane Gell, MD Primary Physician: No primary provider on file. Reason for Consultation: Heart block  HPI: This patient is a 71 year old male with a history of well-controlled diabetes. He does have hypertension, hyperlipidemia, and has a history of a right tympanic membrane perforation. He has had a preoperatively evaluation revealed a marked first-degree AV block,and underwent outpatient cardiovascular evaluation. He did have the abnormal EKG,prolonged first-degree but no ischemia. He had a carotid Doppler on March 11, no significant carotid stenosis. Echo on March 11, normal left ventricularEF, mild left ventricular hypertrophy, mild AI,TR, pulmonary hypertension,  And aortic regurgitation by echo and physical exam.  physical examination. He has a normal heart rate response on a stress test on May 08, 2011. He also has 5 minutes on the Bruce, achieved 7 METS and has been stated to have an acceptable CV risk for his right mastoid tympanoplasty.  He underwent surgery today and did well, but post operartive telemetry and EKG showed high degree AV block and I was conculted to see the patient. Essentially asymptomatic and wife at the bedside.   The following morning patient was essentially asymptomatic. After careful evaluation of the rhythm strips and EKG it was found that patient had developed Wenckebach type AV block. There was no high degree AV block. On telemetry patient had very transient Mobitz type II AV block a very early morning hours when patient was asleep at around 4:00. Hence it was felt not to be significant. I also consulted Dr. Thompson Grayer curbside and felt it was safe for the patient be discharged home. I discussed with Dr. Lorane Gell and he wanted me to discharge the patient. He will follow the patient up in the outpatient basis. I will see the  patient as previously scheduled for follow up off first-degree heart block. Patient has maintained first-degree heart block this morning with a PR interval of close to 400 ms.  Past Medical History  Diagnosis Date  . Mastoiditis     right ear  . Hypertension   . Dysrhythmia     2-3 yrs ago  . Diabetes mellitus     was at one time lost weight amd no problem with it now. About 2-3 yrs ago  . Headache     occas  . Arthritis     bil knees  . Angina     just started recently ,2-3 days ago-went to see Dr. Einar Gip 05/14/2011     Past Surgical History  Procedure Date  . Nasal sinus surgery     left side  . Cystectomy     under chin 1983  . Finger surgery     states he cut finger off when cutting wood     History reviewed. No pertinent family history.  Social History: History   Social History  . Marital Status: Married    Spouse Name: N/A    Number of Children: N/A  . Years of Education: N/A   Occupational History  . Not on file.   Social History Main Topics  . Smoking status: Never Smoker   . Smokeless tobacco: Never Used  . Alcohol Use: No  . Drug Use: No  . Sexually Active:    Other Topics Concern  . Not on file   Social History Narrative  . No narrative on file     Prescriptions prior to admission  Medication Sig  Dispense Refill  . benazepril (LOTENSIN) 10 MG tablet Take 10 mg by mouth daily.      . ciprofloxacin-dexamethasone (CIPRODEX) otic suspension Place 4 drops into the right ear 2 (two) times daily.       . clindamycin (CLEOCIN) 150 MG capsule Take 150 mg by mouth 3 (three) times daily. Take for 10 days. rx. filled 05/02/11      . lisinopril (PRINIVIL,ZESTRIL) 20 MG tablet Take 20 mg by mouth daily.      . meclizine (ANTIVERT) 25 MG tablet Take 25-50 mg by mouth 3 (three) times daily as needed. For dizziness      . neomycin-polymyxin-hydrocortisone (CORTISPORIN) otic solution Place 4 drops into the right ear 4 (four) times daily.       Marland Kitchen NIFEdipine  (PROCARDIA XL/ADALAT-CC) 90 MG 24 hr tablet Take 90 mg by mouth daily.      . Tamsulosin HCl (FLOMAX) 0.4 MG CAPS Take 0.4 mg by mouth daily after supper.        ROS: General: no fevers/chills/night sweats Eyes: no blurry vision, diplopia, or amaurosis ENT: no sore throat or hearing loss Resp: no cough, wheezing, or hemoptysis CV: no edema or palpitations GI: no abdominal pain, nausea, vomiting, diarrhea, or constipation GU: no dysuria, frequency, or hematuria Skin: no rash Neuro: no headache, numbness, tingling, or weakness of extremities Musculoskeletal: no joint pain or swelling Heme: no bleeding, DVT, or easy bruising Endo: no polydipsia or polyuria    Physical Exam: Blood pressure 130/80, pulse 65, temperature 98.1 F (36.7 C), temperature source Oral, resp. rate 18, height 5\' 11"  (1.803 m), weight 106.641 kg (235 lb 1.6 oz), SpO2 96.00%.  Pt is alert and oriented, WD, WN, in no distress. HEENT: normal Neck: JVP normal. Carotid upstrokes normal without bruits. No thyromegaly. Lungs: equal expansion, clear bilaterally CV: Apex is discrete and nondisplaced,S1, S2 normal, EDM heard in the right parasternal border. Abd: soft, NT, +BS, no bruit, no hepatosplenomegaly Back: no CVA tenderness Ext: no C/C/E        Femoral pulses 2+= without bruits        DP/PT pulses intact and = Skin: warm and dry without rash Neuro: CNII-XII intact             Strength intact = bilaterally  Labs:   Lab Results  Component Value Date   WBC 6.7 05/15/2011   HGB 15.5 05/15/2011   HCT 45.7 05/15/2011   MCV 76.7* 05/15/2011   PLT 282 05/15/2011     Lab 05/15/11 1130  NA 140  K 3.8  CL 102  CO2 29  BUN 10  CREATININE 0.95  CALCIUM 9.2  PROT --  BILITOT --  ALKPHOS --  ALT --  AST --  GLUCOSE 137*      Radiology: No results found.  EKG: Mobitz Type I AV block. Complete heart block noted yesterday was an error  ASSESSMENT AND PLAN:  1. 1 st degree AV block is his underlying  rhythm and had periodic Mobitz Type II AV block. Asymptomatic. D/W Thompson Grayer. OK to discharge patient.   I will see him as scheduled in a couple weeks.  Patient is not on any negative chronotropic agents.  Laverda Page, MD 05/20/2011, 9:12 AM

## 2011-05-20 NOTE — Consult Note (Signed)
CARDIOLOGY CONSULT NOTE  Patient ID: Ricky Barnett MRN: [id number] DOB/AGE: 1940-09-24 71 y.o.  Admit date: 05/19/2011 Referring Physician Lorane Gell, MD Primary Physician: No primary provider on file. Reason for Consultation: Heart block  HPI: This patient is a 71 year old male with a history of well-controlled diabetes. He does have hypertension, hyperlipidemia, and has a history of a right tympanic membrane perforation. He has had a preoperatively evaluation revealed a marked first-degree AV block,and underwent outpatient cardiovascular evaluation. He did have the abnormal EKG,prolonged first-degree but no ischemia. He had a carotid Doppler on March 11, no significant carotid stenosis. Echo on March 11, normal left ventricularEF, mild left ventricular hypertrophy, mild AI,TR, pulmonary hypertension,  And aortic regurgitation by echo and physical exam.  physical examination. He has a normal heart rate response on a stress test on May 08, 2011. He also has 5 minutes on the Bruce, achieved 7 METS and has been stated to have an acceptable CV risk for his right mastoid tympanoplasty.  He underwent surgery today and did well, but post operartive telemetry and EKG showed high degree AV block and I was conculted to see the patient. Essentially asymptomatic and wife at the bedside.   Past Medical History  Diagnosis Date  . Mastoiditis     right ear  . Hypertension   . Dysrhythmia     2-3 yrs ago  . Diabetes mellitus     was at one time lost weight amd no problem with it now. About 2-3 yrs ago  . Headache     occas  . Arthritis     bil knees  . Angina     just started recently ,2-3 days ago-went to see Dr. Einar Gip 05/14/2011     Past Surgical History  Procedure Date  . Nasal sinus surgery     left side  . Cystectomy     under chin 1983  . Finger surgery     states he cut finger off when cutting wood     History reviewed. No pertinent family history.  Social History: History    Social History  . Marital Status: Married    Spouse Name: N/A    Number of Children: N/A  . Years of Education: N/A   Occupational History  . Not on file.   Social History Main Topics  . Smoking status: Never Smoker   . Smokeless tobacco: Never Used  . Alcohol Use: No  . Drug Use: No  . Sexually Active:    Other Topics Concern  . Not on file   Social History Narrative  . No narrative on file     Prescriptions prior to admission  Medication Sig Dispense Refill  . benazepril (LOTENSIN) 10 MG tablet Take 10 mg by mouth daily.      . ciprofloxacin-dexamethasone (CIPRODEX) otic suspension Place 4 drops into the right ear 2 (two) times daily.       . clindamycin (CLEOCIN) 150 MG capsule Take 150 mg by mouth 3 (three) times daily. Take for 10 days. rx. filled 05/02/11      . lisinopril (PRINIVIL,ZESTRIL) 20 MG tablet Take 20 mg by mouth daily.      . meclizine (ANTIVERT) 25 MG tablet Take 25-50 mg by mouth 3 (three) times daily as needed. For dizziness      . neomycin-polymyxin-hydrocortisone (CORTISPORIN) otic solution Place 4 drops into the right ear 4 (four) times daily.       Marland Kitchen NIFEdipine (PROCARDIA XL/ADALAT-CC) 90 MG 24 hr  tablet Take 90 mg by mouth daily.      . Tamsulosin HCl (FLOMAX) 0.4 MG CAPS Take 0.4 mg by mouth daily after supper.        ROS: General: no fevers/chills/night sweats Eyes: no blurry vision, diplopia, or amaurosis ENT: no sore throat or hearing loss Resp: no cough, wheezing, or hemoptysis CV: no edema or palpitations GI: no abdominal pain, nausea, vomiting, diarrhea, or constipation GU: no dysuria, frequency, or hematuria Skin: no rash Neuro: no headache, numbness, tingling, or weakness of extremities Musculoskeletal: no joint pain or swelling Heme: no bleeding, DVT, or easy bruising Endo: no polydipsia or polyuria    Physical Exam: Blood pressure 130/80, pulse 65, temperature 98.1 F (36.7 C), temperature source Oral, resp. rate 18, height 5'  11" (1.803 m), weight 106.641 kg (235 lb 1.6 oz), SpO2 96.00%.  Pt is alert and oriented, WD, WN, in no distress. HEENT: normal Neck: JVP normal. Carotid upstrokes normal without bruits. No thyromegaly. Lungs: equal expansion, clear bilaterally CV: Apex is discrete and nondisplaced,S1, S2 normal, EDM heard in the right parasternal border. Abd: soft, NT, +BS, no bruit, no hepatosplenomegaly Back: no CVA tenderness Ext: no C/C/E        Femoral pulses 2+= without bruits        DP/PT pulses intact and = Skin: warm and dry without rash Neuro: CNII-XII intact             Strength intact = bilaterally  Labs:   Lab Results  Component Value Date   WBC 6.7 05/15/2011   HGB 15.5 05/15/2011   HCT 45.7 05/15/2011   MCV 76.7* 05/15/2011   PLT 282 05/15/2011     Lab 05/15/11 1130  NA 140  K 3.8  CL 102  CO2 29  BUN 10  CREATININE 0.95  CALCIUM 9.2  PROT --  BILITOT --  ALKPHOS --  ALT --  AST --  GLUCOSE 137*      Radiology: No results found.  EKG: Mobitz Type I AV block. Complete heart block noted yesterday was an error  ASSESSMENT AND PLAN:  1. 1 st degree AV block is his underlying rhythm and had periodic Mobitz Type II AV block. Asymptomatic. D/W Thompson Grayer. OK to discharge patient.   I will see him as scheduled in a couple weeks.  Patient is not on any negative chronotropic agents.  Laverda Page, MD 05/20/2011, 7:56 AM

## 2011-05-21 LAB — GLUCOSE, CAPILLARY: Glucose-Capillary: 102 mg/dL — ABNORMAL HIGH (ref 70–99)

## 2012-10-18 ENCOUNTER — Ambulatory Visit (INDEPENDENT_AMBULATORY_CARE_PROVIDER_SITE_OTHER): Payer: Medicare Other | Admitting: Cardiovascular Disease

## 2012-10-18 ENCOUNTER — Encounter: Payer: Self-pay | Admitting: Cardiovascular Disease

## 2012-10-18 VITALS — BP 142/84 | HR 78 | Ht 71.0 in | Wt 247.0 lb

## 2012-10-18 DIAGNOSIS — E119 Type 2 diabetes mellitus without complications: Secondary | ICD-10-CM

## 2012-10-18 DIAGNOSIS — I440 Atrioventricular block, first degree: Secondary | ICD-10-CM

## 2012-10-18 DIAGNOSIS — R079 Chest pain, unspecified: Secondary | ICD-10-CM

## 2012-10-18 DIAGNOSIS — R002 Palpitations: Secondary | ICD-10-CM

## 2012-10-18 DIAGNOSIS — I10 Essential (primary) hypertension: Secondary | ICD-10-CM

## 2012-10-18 DIAGNOSIS — E785 Hyperlipidemia, unspecified: Secondary | ICD-10-CM

## 2012-10-18 NOTE — Assessment & Plan Note (Addendum)
Patient has risk factors including hypertension, hypokalemia and diabetes. He had substernal chest pressure lasting 10-15 minutes at times it several times a week for the last several weeks. He had a brother who died in his early 62s of a myocardial infarction. Going to get a pharmacologic Myoview stress test to rule out an ischemic etiology. He does have a long first degree AV block.

## 2012-10-18 NOTE — Assessment & Plan Note (Signed)
Well-controlled on current medications 

## 2012-10-18 NOTE — Assessment & Plan Note (Signed)
Patient relates episodes of palpitations lasting up to 2 hours at time of the last several weeks. I'm going to get a 30 day event monitor as well as a 2-D echocardiogram

## 2012-10-18 NOTE — Patient Instructions (Addendum)
  We will see you back in follow up after testing.   Dr Gwenlyn Found has ordered a 30 day event monitor, lexiscan myoview, and an echocardiogram  Your physician has requested that you have an echocardiogram. Echocardiography is a painless test that uses sound waves to create images of your heart. It provides your doctor with information about the size and shape of your heart and how well your heart's chambers and valves are working. This procedure takes approximately one hour. There are no restrictions for this procedure.  Your physician has requested that you have a lexiscan myoview. For further information please visit [email or web address]. Please follow instruction sheet, as given.

## 2012-10-18 NOTE — Progress Notes (Signed)
10/18/2012 Ricky Barnett   [birth year]  [id number]  Primary Physician No primary provider on file. Primary Cardiologist: Lorretta Harp MD Renae Gloss   HPI:  Ricky Barnett is a 72 year old moderately overweight married African American male father of 46, grandfather to 54 grandchildren referred to me by Dr. Ashby Dawes for evaluation of palpitations and chest pain. His cardiac risk factor profile is positive for hypertension, diabetes and hyperlipidemia. He did have a brother who died at age 71 of a myocardial infarction. He has never had a cardiac or stroke. He does complain of substernal chest pressure lasting up to 15 minutes at a time occurring 3-4 times in the last several weeks which is new for him. He also complains of episodes of tachycardia palpitations. He was evaluated by Dr. Gaylyn Cheers she 05/19/11 for preoperative clearance for surgical correction of a right tympanic membrane perforation. He had a Bruce protocol exercise stress test which time he achieved 7 metastases. He did have a prolonged first degree AV block at that time.   Current Outpatient Prescriptions  Medication Sig Dispense Refill  . amLODipine-benazepril (LOTREL) 5-20 MG per capsule Take 1 capsule by mouth daily.       Marland Kitchen aspirin 81 MG tablet Take 81 mg by mouth daily.      . metFORMIN (GLUCOPHAGE-XR) 750 MG 24 hr tablet 750 mg 2 (two) times daily.       . pravastatin (PRAVACHOL) 40 MG tablet Take 40 mg by mouth daily.       . Tamsulosin HCl (FLOMAX) 0.4 MG CAPS Take 0.4 mg by mouth daily after supper.       No current facility-administered medications for this visit.    No Known Allergies  History   Social History  . Marital Status: Married    Spouse Name: N/A    Number of Children: N/A  . Years of Education: N/A   Occupational History  . Not on file.   Social History Main Topics  . Smoking status: Never Smoker   . Smokeless tobacco: Never Used  . Alcohol Use: No  . Drug Use: No  . Sexual  Activity:    Other Topics Concern  . Not on file   Social History Narrative  . No narrative on file     Review of Systems: General: negative for chills, fever, night sweats or weight changes.  Cardiovascular: negative for chest pain, dyspnea on exertion, edema, orthopnea, palpitations, paroxysmal nocturnal dyspnea or shortness of breath Dermatological: negative for rash Respiratory: negative for cough or wheezing Urologic: negative for hematuria Abdominal: negative for nausea, vomiting, diarrhea, bright red blood per rectum, melena, or hematemesis Neurologic: negative for visual changes, syncope, or dizziness All other systems reviewed and are otherwise negative except as noted above.    Blood pressure 142/84, pulse 78, height 5\' 11"  (1.803 m), weight 247 lb (112.038 kg).  General appearance: alert and no distress Neck: no adenopathy, no carotid bruit, no JVD, supple, symmetrical, trachea midline and thyroid not enlarged, symmetric, no tenderness/mass/nodules Lungs: clear to auscultation bilaterally Heart: regular rate and rhythm, S1, S2 normal, no murmur, click, rub or gallop Abdomen: soft, non-tender; bowel sounds normal; no masses,  no organomegaly Extremities: extremities normal, atraumatic, no cyanosis or edema Pulses: 2+ and symmetric  EKG normal sinus rhythm at 78 with a prolonged first degree AV block and a PR interval of 406 ms  ASSESSMENT AND PLAN:   Chest pain Patient has risk factors including hypertension, hypokalemia and diabetes. He had  substernal chest pressure lasting 10-15 minutes at times it several times a week for the last several weeks. He had a brother who died in his early 28s of a myocardial infarction. Going to get a pharmacologic Myoview stress test to rule out an ischemic etiology. He does have a long first degree AV block.  Palpitations Patient relates episodes of palpitations lasting up to 2 hours at time of the last several weeks. I'm going to get  a 30 day event monitor as well as a 2-D echocardiogram  Essential hypertension Well-controlled on current medications      Lorretta Harp MD [facility], [facility] 10/18/2012 11:17 AM

## 2012-10-19 ENCOUNTER — Telehealth: Payer: Self-pay | Admitting: Cardiovascular Disease

## 2012-10-19 DIAGNOSIS — R002 Palpitations: Secondary | ICD-10-CM

## 2012-10-19 NOTE — Telephone Encounter (Signed)
Unable to get the monitor on . Have some questions please call..  Thanks

## 2012-10-19 NOTE — Telephone Encounter (Signed)
Returned call and spoke w/ pt.  Stated he has been having a hard time getting the monitor on.  Stated he will call back tomorrow morning if he can't get it on b/c he'd rather come in and get someone to put it on him.  Pt informed he can call back in the morning since he is driving and an appt can be schedule to set up his monitor.  Pt verbalized understanding and agreed w/ plan.

## 2012-10-19 NOTE — Telephone Encounter (Signed)
Returned call.  Left message to call back before 4pm.  

## 2012-10-20 NOTE — Telephone Encounter (Signed)
Returned call. Pt stated they were able to get the monitor hooked up and haven't had any problems w/ it.  Advised he call back or call [facility] w/ any problems.  Pt verbalized understanding and agreed w/ plan.

## 2012-10-21 ENCOUNTER — Telehealth: Payer: Self-pay | Admitting: Cardiovascular Disease

## 2012-10-21 NOTE — Telephone Encounter (Signed)
Per [facility], urgent report of severe bradycardia: HR 39 lasting 37 secs.

## 2012-10-21 NOTE — Telephone Encounter (Signed)
i spoke with patient and informed him that we would like to start him out on the treadmill for his myoview.  He is agreeable.

## 2012-10-21 NOTE — Telephone Encounter (Signed)
Called pt, he stated he was up and about at the time of the arrythmia. No chest pain, no lightheadedness.  On one strip his HR was 33.  This is Mobitz 1.  Discussed with Dr. Ellyn Hack and pt will need  Stress myoview to eval chronotropic incompetence, with the awareness it will need to be switched to [facility] as we doubt his HR will increase.  He is on no meds to cause bradycardia.

## 2012-10-21 NOTE — Telephone Encounter (Signed)
**  Correction**: HR 41 lasting 37 secs.  Serita Butcher, NP notified.

## 2012-10-22 ENCOUNTER — Other Ambulatory Visit (HOSPITAL_COMMUNITY): Payer: Self-pay | Admitting: Cardiovascular Disease

## 2012-10-22 DIAGNOSIS — R079 Chest pain, unspecified: Secondary | ICD-10-CM

## 2012-10-25 ENCOUNTER — Telehealth: Payer: Self-pay | Admitting: Cardiovascular Disease

## 2012-10-25 ENCOUNTER — Ambulatory Visit (INDEPENDENT_AMBULATORY_CARE_PROVIDER_SITE_OTHER): Payer: Medicare Other | Admitting: Cardiology

## 2012-10-25 ENCOUNTER — Encounter: Payer: Self-pay | Admitting: Cardiology

## 2012-10-25 VITALS — BP 136/90 | HR 96 | Ht 71.0 in | Wt 245.0 lb

## 2012-10-25 DIAGNOSIS — I471 Supraventricular tachycardia: Secondary | ICD-10-CM | POA: Insufficient documentation

## 2012-10-25 DIAGNOSIS — I441 Atrioventricular block, second degree: Secondary | ICD-10-CM | POA: Insufficient documentation

## 2012-10-25 DIAGNOSIS — R079 Chest pain, unspecified: Secondary | ICD-10-CM

## 2012-10-25 DIAGNOSIS — I498 Other specified cardiac arrhythmias: Secondary | ICD-10-CM

## 2012-10-25 NOTE — Assessment & Plan Note (Signed)
Patient has had 2 episodes of Mobitz 1 with heart rates to 39. Initially patient was awake and asymptomatic, most recent episode patient was asleep but asymptomatic upon waking by phone call.  Monitor originally placed for symptoms of tachycardia.

## 2012-10-25 NOTE — Assessment & Plan Note (Signed)
Here in office EKG with junct tach with retrograde p wave.

## 2012-10-25 NOTE — Assessment & Plan Note (Signed)
For stress test this week or next.  He has occ episode brief chest discomfort.

## 2012-10-25 NOTE — Patient Instructions (Addendum)
Call us if lightheadedness or dizziness, or any passing out go to ER.  Re-schedule your study for next week.  Heart Healthy diet.  Cardiac Diet This diet can help prevent heart disease and stroke. Many factors influence your heart health, including eating and exercise habits. Coronary risk rises a lot with abnormal blood fat (lipid) levels. Cardiac meal planning includes limiting unhealthy fats, increasing healthy fats, and making other small dietary changes. General guidelines are as follows:  Adjust calorie intake to reach and maintain desirable body weight.  Limit total fat intake to less than 30% of total calories. Saturated fat should be less than 7% of calories.  Saturated fats are found in animal products and in some vegetable products. Saturated vegetable fats are found in coconut oil, cocoa butter, palm oil, and palm kernel oil. Read labels carefully to avoid these products as much as possible. Use butter in moderation. Choose tub margarines and oils that have 2 grams of fat or less. Good cooking oils are canola and olive oils.  Practice low-fat cooking techniques. Do not fry food. Instead, broil, bake, boil, steam, grill, roast on a rack, stir-fry, or microwave it. Other fat reducing suggestions include:  Remove the skin from poultry.  Remove all visible fat from meats.  Skim the fat off stews, soups, and gravies before serving them.  Steam vegetables in water or broth instead of sauting them in fat.  Avoid foods with trans fat (or hydrogenated oils), such as commercially fried foods and commercially baked goods. Commercial shortening and deep-frying fats will contain trans fat.  Increase intake of fruits, vegetables, whole grains, and legumes to replace foods high in fat.  Increase consumption of nuts, legumes, and seeds to at least 4 servings weekly. One serving of a legume equals  cup, and 1 serving of nuts or seeds equals  cup.  Choose whole grains more often. Have 3  servings per day (a serving is 1 ounce [oz]).  Eat 4 to 5 servings of vegetables per day. A serving of vegetables is 1 cup of raw leafy vegetables;  cup of raw or cooked cut-up vegetables;  cup of vegetable juice.  Eat 4 to 5 servings of fruit per day. A serving of fruit is 1 medium whole fruit;  cup of dried fruit;  cup of fresh, frozen, or canned fruit;  cup of 100% fruit juice.  Increase your intake of dietary fiber to 20 to 30 grams per day. Insoluble fiber may help lower your risk of heart disease and may help curb your appetite.  Soluble fiber binds cholesterol to be removed from the blood. Foods high in soluble fiber are dried beans, citrus fruits, oats, apples, bananas, broccoli, Brussels sprouts, and eggplant.  Try to include foods fortified with plant sterols or stanols, such as yogurt, breads, juices, or margarines. Choose several fortified foods to achieve a daily intake of 2 to 3 grams of plant sterols or stanols.  Foods with omega-3 fats can help reduce your risk of heart disease. Aim to have a 3.5 oz portion of fatty fish twice per week, such as salmon, mackerel, albacore tuna, sardines, lake trout, or herring. If you wish to take a fish oil supplement, choose one that contains 1 gram of both DHA and EPA.  Limit processed meats to 2 servings (3 oz portion) weekly.  Limit the sodium in your diet to 1500 milligrams (mg) per day. If you have high blood pressure, talk to a registered dietitian about a DASH (Dietary Approaches  to Stop Hypertension) eating plan.  Limit sweets and beverages with added sugar, such as soda, to no more than 5 servings per week. One serving is:   1 tablespoon sugar.  1 tablespoon jelly or jam.   cup sorbet.  1 cup lemonade.   cup regular soda. CHOOSING FOODS Starches  Allowed: Breads: All kinds (wheat, rye, raisin, white, oatmeal, [place], [place], and English muffin bread). Low-fat rolls: English muffins, frankfurter and hamburger buns,  bagels, pita bread, tortillas (not fried). Pancakes, waffles, biscuits, and muffins made with recommended oil.  Avoid: Products made with saturated or trans fats, oils, or whole milk products. Butter rolls, cheese breads, croissants. Commercial doughnuts, muffins, sweet rolls, biscuits, waffles, pancakes, store-bought mixes. Crackers  Allowed: Low-fat crackers and snacks: Animal, graham, rye, saltine (with recommended oil, no lard), oyster, and matzo crackers. Bread sticks, melba toast, rusks, flatbread, pretzels, and light popcorn.  Avoid: High-fat crackers: cheese crackers, butter crackers, and those made with coconut, palm oil, or trans fat (hydrogenated oils). Buttered popcorn. Cereals  Allowed: Hot or cold whole-grain cereals.  Avoid: Cereals containing coconut, hydrogenated vegetable fat, or animal fat. Potatoes / Pasta / Rice  Allowed: All kinds of potatoes, rice, and pasta (such as macaroni, spaghetti, and noodles).  Avoid: Pasta or rice prepared with cream sauce or high-fat cheese. Chow mein noodles, [place] fries. Vegetables  Allowed: All vegetables and vegetable juices.  Avoid: Fried vegetables. Vegetables in cream, butter, or high-fat cheese sauces. Limit coconut. Fruit in cream or custard. Protein  Allowed: Limit your intake of meat, seafood, and poultry to no more than 6 oz (cooked weight) per day. All lean, well-trimmed beef, veal, pork, and lamb. All chicken and [place] without skin. All fish and shellfish. Wild game: wild duck, rabbit, pheasant, and venison. Egg whites or low-cholesterol egg substitutes may be used as desired. Meatless dishes: recipes with dried beans, peas, lentils, and tofu (soybean curd). Seeds and nuts: all seeds and most nuts.  Avoid: Prime grade and other heavily marbled and fatty meats, such as short ribs, spare ribs, rib eye roast or steak, frankfurters, sausage, bacon, and high-fat luncheon meats, mutton. Caviar. Commercially fried fish. Domestic  duck, goose, venison sausage. Organ meats: liver, gizzard, heart, chitterlings, brains, kidney, sweetbreads. Dairy  Allowed: Low-fat cheeses: nonfat or low-fat cottage cheese (1% or 2% fat), cheeses made with part skim milk, such as mozzarella, farmers, string, or ricotta. (Cheeses should be labeled no more than 2 to 6 grams fat per oz.). Skim (or 1%) milk: liquid, powdered, or evaporated. Buttermilk made with low-fat milk. Drinks made with skim or low-fat milk or cocoa. Chocolate milk or cocoa made with skim or low-fat (1%) milk. Nonfat or low-fat yogurt.  Avoid: Whole milk cheeses, including colby, cheddar, mu[place], Monterey Jack, [place], [place], [place], American, Swiss, and blue. Creamed cottage cheese, cream cheese. Whole milk and whole milk products, including buttermilk or yogurt made from whole milk, drinks made from whole milk. Condensed milk, evaporated whole milk, and 2% milk. Soups and Combination Foods  Allowed: Low-fat low-sodium soups: broth, dehydrated soups, homemade broth, soups with the fat removed, homemade cream soups made with skim or low-fat milk. Low-fat spaghetti, lasagna, chili, and Spanish rice if low-fat ingredients and low-fat cooking techniques are used.  Avoid: Cream soups made with whole milk, cream, or high-fat cheese. All other soups. Desserts and Sweets  Allowed: Sherbet, fruit ices, gelatins, meringues, and angel food cake. Homemade desserts with recommended fats, oils, and milk products. Jam, jelly, honey, marmalade, sugars,  and syrups. Pure sugar candy, such as gum drops, hard candy, jelly beans, marshmallows, mints, and small amounts of dark chocolate.  Avoid: Commercially prepared cakes, pies, cookies, frosting, pudding, or mixes for these products. Desserts containing whole milk products, chocolate, coconut, lard, palm oil, or palm kernel oil. Ice cream or ice cream drinks. Candy that contains chocolate, coconut, butter, hydrogenated fat, or unknown  ingredients. Buttered syrups. Fats and Oils  Allowed: Vegetable oils: safflower, sunflower, corn, soybean, cottonseed, sesame, canola, olive, or peanut. Non-hydrogenated margarines. Salad dressing or mayonnaise: homemade or commercial, made with a recommended oil. Low or nonfat salad dressing or mayonnaise.  Limit added fats and oils to 6 to 8 tsp per day (includes fats used in cooking, baking, salads, and spreads on bread). Remember to count the "hidden fats" in foods.  Avoid: Solid fats and shortenings: butter, lard, salt pork, bacon drippings. Gravy containing meat fat, shortening, or suet. Cocoa butter, coconut. Coconut oil, palm oil, palm kernel oil, or hydrogenated oils: these ingredients are often used in bakery products, nondairy creamers, whipped toppings, candy, and commercially fried foods. Read labels carefully. Salad dressings made of unknown oils, sour cream, or cheese, such as blue cheese and Roquefort. Cream, all kinds: half-and-half, light, heavy, or whipping. Sour cream or cream cheese (even if "light" or low-fat). Nondairy cream substitutes: coffee creamers and sour cream substitutes made with palm, palm kernel, hydrogenated oils, or coconut oil. Beverages  Allowed: Coffee (regular or decaffeinated), tea. Diet carbonated beverages, mineral water. Alcohol: Check with your caregiver. Moderation is recommended.  Avoid: Whole milk, regular sodas, and juice drinks with added sugar. Condiments  Allowed: All seasonings and condiments. Cocoa powder. "Cream" sauces made with recommended ingredients.  Avoid: Carob powder made with hydrogenated fats. SAMPLE MENU Breakfast   cup orange juice   cup oatmeal  1 slice toast  1 tsp margarine  1 cup skim milk Lunch  [place] sandwich with 2 oz [place], 2 slices bread  Lettuce and tomato slices  Fresh fruit  Carrot sticks  Coffee or tea Snack  Fresh fruit or low-fat crackers Dinner  3 oz lean ground beef  1 baked  potato  1 tsp margarine   cup asparagus  Lettuce salad  1 tbs non-creamy dressing   cup peach slices  1 cup skim milk Document Released: 11/27/2007 Document Revised: 08/19/2011 Document Reviewed: 05/13/2011 Exit[employer] Patient Information 2014 [place], [place].

## 2012-10-25 NOTE — Telephone Encounter (Signed)
Returned call.  Pt informed message received and report located and shown to Dr. Gwenlyn Found.  Per Dr. Gwenlyn Found pt to be seen today w/ Mickel Baas, NP.  Pt informed and agreed w/ plan.  Appt scheduled for today at 3:40pm w/ Mickel Baas, NP.

## 2012-10-25 NOTE — Progress Notes (Signed)
10/25/2012   PCP: No primary provider on file.   Chief Complaint  Patient presents with  . abnormal EKG    Primary Cardiologist:Dr. Adora Fridge   HPI:72 year old moderately overweight married African American male father of 64, grandfather to 45 grandchildren referred to Dr. Gwenlyn Found by Dr. Ashby Dawes for evaluation of palpitations and chest pain. His cardiac risk factor profile is positive for hypertension, diabetes and hyperlipidemia. He did have a brother who died at age 34 of a myocardial infarction. He has never had a cardiac or stroke. He does complain of substernal chest pressure lasting up to 15 minutes at a time occurring 3-4 times in the last several weeks which is new for him. He also complains of episodes of tachycardia palpitations. He was evaluated by Dr. Gaylyn Cheers she 05/19/11 for preoperative clearance for surgical correction of a right tympanic membrane perforation. He had a Bruce protocol exercise stress test which time he achieved 7 metastases. He did have a prolonged first degree AV block at that time.  On visit with Dr. Gwenlyn Found he had stress test ordered for chest pain and event monitor for palpitations.  On the event monitor he has been having episodes of Wenckebach. First episode was on August 22 Wenckebach  Type I with heart rate of 39.  Patient stated he was up and about without any symptoms. Again today 10/25/2012 event monitor also revealed another episode Wenchebach type I with a heart rate of 39. Patient stated he was asleep when he was called him when he had the bradycardia but had no symptoms.  He does continue with mild episodes of bradycardia. He denies any palpitations recently.  Patient is unable to afford the co-pay for his LexiScan Myoview this Friday he will reschedule to have next week when he will have the co-pay.   No Known Allergies  Current Outpatient Prescriptions  Medication Sig Dispense Refill  . amLODipine-benazepril (LOTREL) 5-20 MG per capsule  Take 1 capsule by mouth daily.       Marland Kitchen aspirin 81 MG tablet Take 81 mg by mouth daily.      . metFORMIN (GLUCOPHAGE-XR) 750 MG 24 hr tablet 750 mg 2 (two) times daily.       . pravastatin (PRAVACHOL) 40 MG tablet Take 40 mg by mouth daily.       . Tamsulosin HCl (FLOMAX) 0.4 MG CAPS Take 0.4 mg by mouth daily after supper.       No current facility-administered medications for this visit.    Past Medical History  Diagnosis Date  . Mastoiditis     right ear  . Hypertension   . Dysrhythmia     2-3 yrs ago  . Diabetes mellitus     was at one time lost weight amd no problem with it now. About 2-3 yrs ago  . Headache(784.0)     occas  . Arthritis     bil knees  . Angina     just started recently ,2-3 days ago-went to see Dr. Einar Gip 05/14/2011  . Palpitations     Past Surgical History  Procedure Laterality Date  . Nasal sinus surgery      left side  . Cystectomy      under chin 1983  . Finger surgery      states he cut finger off when cutting wood  . Tympanoplasty  05/19/2011    Procedure: TYMPANOPLASTY;  Surgeon: Thornell Sartorius, MD;  Location: [facility];  Service: ENT;  Laterality: Right;  Exploratory Tympanotomy    [id number] colds or fevers, no weight changes Skin:no rashes or ulcers HEENT:no blurred vision, no congestion CV:see HPI PUL:see HPI GI:no diarrhea constipation or melena, no indigestion GU:no hematuria, no dysuria MS:no joint pain, no claudication Neuro:no syncope, no lightheadedness Endo:+ diabetes, no thyroid disease  PHYSICAL EXAM BP 136/90  Pulse 96  Ht 5\' 11"  (1.803 m)  Wt 245 lb (111.131 kg)  BMI 34.19 kg/m2 General:Pleasant affect, NAD Skin:Warm and dry, brisk capillary refill HEENT:normocephalic, sclera clear, mucus membranes moist Neck:supple, no JVD, no bruits  Heart:S1S2 RRR without murmur, gallup, rub or click Lungs:clear without rales, rhonchi, or wheezes [id number], non tender, + BS, do not palpate liver spleen or masses Ext:no lower  ext edema, 2+ pedal pulses, 2+ radial pulses Neuro:alert and oriented, MAE, follows commands, + facial symmetry  [id number] tach rate of 96 with retrograde p wave.  New from previous EKG.    ASSESSMENT AND PLAN AV block, Mobitz 1, with HR to 39 Patient has had 2 episodes of Mobitz 1 with heart rates to 39. Initially patient was awake and asymptomatic, most recent episode patient was asleep but asymptomatic upon waking by phone call.  Monitor originally placed for symptoms of tachycardia.     AV junctional tachycardia Here in office EKG with junct tach with retrograde p wave.   Chest pain For stress test this week or next.  He has occ episode brief chest discomfort.   He'll call if he has any syncope, and near syncope or dizziness.

## 2012-10-25 NOTE — Telephone Encounter (Signed)
Wants to know if everything is all right with his monitor-received call from [place] [facility] this morning-they told him to call the office this morning.

## 2012-10-26 ENCOUNTER — Encounter: Payer: Self-pay | Admitting: Cardiovascular Disease

## 2012-10-28 ENCOUNTER — Encounter: Payer: Self-pay | Admitting: Cardiology

## 2012-10-29 ENCOUNTER — Inpatient Hospital Stay (HOSPITAL_COMMUNITY): Admission: RE | Admit: 2012-10-29 | Payer: Medicare Other | Source: Ambulatory Visit

## 2012-11-04 ENCOUNTER — Ambulatory Visit (HOSPITAL_COMMUNITY)
Admission: RE | Admit: 2012-11-04 | Discharge: 2012-11-04 | Disposition: A | Discharge status: Home / Self Care | Payer: Medicare Other | Source: Ambulatory Visit | Attending: Cardiovascular Disease | Admitting: Cardiovascular Disease

## 2012-11-04 DIAGNOSIS — R002 Palpitations: Secondary | ICD-10-CM | POA: Insufficient documentation

## 2012-11-04 DIAGNOSIS — R42 Dizziness and giddiness: Secondary | ICD-10-CM | POA: Insufficient documentation

## 2012-11-04 DIAGNOSIS — Z8249 Family history of ischemic heart disease and other diseases of the circulatory system: Secondary | ICD-10-CM | POA: Insufficient documentation

## 2012-11-04 DIAGNOSIS — I10 Essential (primary) hypertension: Secondary | ICD-10-CM | POA: Insufficient documentation

## 2012-11-04 DIAGNOSIS — R079 Chest pain, unspecified: Secondary | ICD-10-CM | POA: Insufficient documentation

## 2012-11-04 DIAGNOSIS — E119 Type 2 diabetes mellitus without complications: Secondary | ICD-10-CM | POA: Insufficient documentation

## 2012-11-04 MED ORDER — TECHNETIUM TC 99M SESTAMIBI GENERIC - CARDIOLITE
30.2000 | Freq: Once | INTRAVENOUS | Status: AC | PRN
  Administered 2012-11-04: 30.2 via INTRAVENOUS

## 2012-11-04 MED ORDER — TECHNETIUM TC 99M SESTAMIBI GENERIC - CARDIOLITE
10.6000 | Freq: Once | INTRAVENOUS | Status: AC | PRN
  Administered 2012-11-04: 11 via INTRAVENOUS

## 2012-11-04 NOTE — Procedures (Addendum)
[facility] [address] [place] [id number]  Cardiology Nuclear Med Study  IBROHIM BUCCIERI is a 72 y.o. male     MRN : [id number]     DOB: 1940/05/21  Procedure Date: 11/04/2012  Nuclear Med Background Indication for Stress Test:  Evaluation for Ischemia and Abnormal EKG History:  pt denies prior history. Cardiac Risk Factors: Family History - CAD, Hypertension, Lipids, NIDDM and Overweight  Symptoms:  Chest Pain, Dizziness, Light-Headedness, Palpitations and SOB   Nuclear Pre-Procedure Caffeine/Decaff Intake:  1:00am NPO After: 11AM   IV Site: R Wrist  IV 0.9% NS with Angio Cath:  22g  Chest Size (in):  44" IV Started by: Azucena Cecil, RN  Height: 5\' 11"  (1.803 m)  Cup Size: n/a  BMI:  Body mass index is 34.46 kg/(m^2). Weight:  247 lb (112.038 kg)   Tech Comments:  N/A    Nuclear Med Study 1 or 2 day study: 1 day  Stress Test Type:  Stress  Order Authorizing Provider:  Quay Burow, MD   Resting Radionuclide: Technetium 73m Sestamibi  Resting Radionuclide Dose: 10.6 mCi   Stress Radionuclide:  Technetium 54m Sestamibi  Stress Radionuclide Dose: 30.2 mCi           Stress Protocol Rest HR: 71 Stress HR: 126  Rest BP: 158/94 Stress BP: 218/82  Exercise Time (min): 6 METS: 7.0   Predicted Max HR: 147 bpm % Max HR: 85.71 bpm Rate Pressure Product: 27468  Dose of Adenosine (mg):  n/a Dose of Lexiscan: n/a mg  Dose of Atropine (mg): n/a Dose of Dobutamine: n/a mcg/kg/min (at max HR)  Stress Test Technologist: Leane Para, CCT Nuclear Technologist: Imagene Riches, CNMT   Rest Procedure:  Myocardial perfusion imaging was performed at rest 45 minutes following the intravenous administration of Technetium 51m Sestamibi. Stress Procedure:  The patient performed treadmill exercise using a Bruce  Protocol for 6 minutes. The patient stopped due to general fatigue and denied any chest pain.  There were no  significant ST-T wave changes.  Technetium 49m Sestamibi was injected at peak exercise and myocardial perfusion imaging was performed after a brief delay.  Transient Ischemic Dilatation (Normal <1.22):  0.77 Lung/Heart Ratio (Normal <0.45):  0.32 QGS EDV:  129 ml QGS ESV:  57 ml LV Ejection Fraction: 56%  Signed by     Rest ECG: NSR - Normal EKG  Stress ECG: No significant change from baseline ECG  QPS Raw Data Images:  Normal; no motion artifact; normal heart/lung ratio. Stress Images:  Normal homogeneous uptake in all areas of the myocardium. Rest Images:  Normal homogeneous uptake in all areas of the myocardium. Subtraction (SDS):  No evidence of ischemia.  Impression Exercise Capacity:  Good exercise capacity. BP Response:  Normal blood pressure response. Clinical Symptoms:  No significant symptoms noted. ECG Impression:  No significant ST segment change suggestive of ischemia. Comparison with Prior Nuclear Study: No previous nuclear study performed  Overall Impression:  Normal stress nuclear study.  LV Wall Motion:  NL LV Function; NL Wall Motion   Lorretta Harp, MD  11/04/2012 5:00 PM

## 2012-11-05 ENCOUNTER — Telehealth: Payer: Self-pay | Admitting: Internal Medicine

## 2012-11-05 ENCOUNTER — Ambulatory Visit (HOSPITAL_COMMUNITY)
Admission: RE | Admit: 2012-11-05 | Discharge: 2012-11-05 | Disposition: A | Discharge status: Home / Self Care | Payer: Medicare Other | Source: Ambulatory Visit | Attending: Cardiovascular Disease | Admitting: Cardiovascular Disease

## 2012-11-05 DIAGNOSIS — E785 Hyperlipidemia, unspecified: Secondary | ICD-10-CM | POA: Insufficient documentation

## 2012-11-05 DIAGNOSIS — E119 Type 2 diabetes mellitus without complications: Secondary | ICD-10-CM | POA: Insufficient documentation

## 2012-11-05 DIAGNOSIS — R002 Palpitations: Secondary | ICD-10-CM | POA: Insufficient documentation

## 2012-11-05 DIAGNOSIS — R079 Chest pain, unspecified: Secondary | ICD-10-CM

## 2012-11-05 DIAGNOSIS — I441 Atrioventricular block, second degree: Secondary | ICD-10-CM | POA: Insufficient documentation

## 2012-11-05 DIAGNOSIS — I10 Essential (primary) hypertension: Secondary | ICD-10-CM | POA: Insufficient documentation

## 2012-11-05 NOTE — Telephone Encounter (Signed)
Contacted by [facility] re sinus bradycardia with 2:1 heart block. Hr in mid 28s.  I called to assess patient symptoms.  Patient stated he had mild chest discomfort one hour prior.  He is now without chest pain, shortness of breath, presyncope or syncope.  He has appt to return to [facility] clinic this am.  Rhythm strips were also faxed this am to Dr. Gwenlyn Found for review.

## 2012-11-05 NOTE — Progress Notes (Signed)
 [facility]   2D echo completed 11/05/2012.   Jamison Neighbor, RDCS

## 2012-11-08 ENCOUNTER — Encounter: Payer: Self-pay | Admitting: *Deleted

## 2012-11-09 ENCOUNTER — Telehealth: Payer: Self-pay | Admitting: Cardiovascular Disease

## 2012-11-09 ENCOUNTER — Telehealth: Payer: Self-pay | Admitting: Cardiology

## 2012-11-09 NOTE — Telephone Encounter (Signed)
Returning  The call-says she doesn't know who called him.

## 2012-11-09 NOTE — Telephone Encounter (Signed)
Please contact pt to make sure no symptoms.  He has appt with Dr. Gwenlyn Found the 29th.  In Office he was in [place].  ? Need for pacemaker.  Dr. Gwenlyn Found to discuss on his visit.

## 2012-11-09 NOTE — Telephone Encounter (Signed)
[facility] called with an urgent report of bradycardia.  No symptoms were reported by the patient.  I will gather the fax and show to a MD

## 2012-11-09 NOTE — Telephone Encounter (Signed)
Call to pt and informed Mickel Baas, NP wants Dr. Gwenlyn Found to review everything and decide on how to proceed before he comes in for appt.  Pt verbalized understanding and agreed w/ plan.  Appt cancelled.

## 2012-11-09 NOTE — Telephone Encounter (Signed)
Returned call.  Pt informed Ricky Baas, NP wanted to check on him b/c of slow HR.  Pt denied symptoms initially and then c/o int chest tension and fatigue.  Stated it's nothing he can't handle.  Pt advised to schedule sooner appt than 29th w/ Dr. Gwenlyn Found for f/u.  Pt verbalized understanding and agreed w/ plan.  Appt scheduled for Thursday, 9.11.14 at 9:40am w/ Lurena Joiner, PA for evaluation.

## 2012-11-09 NOTE — Telephone Encounter (Signed)
Ricky Barnett will ask Dr. Gwenlyn Found about what pt should do ie:  Pacemaker or cath and then we can call pt with plan vs. having him come back in without a plan in place.

## 2012-11-11 ENCOUNTER — Telehealth: Payer: Self-pay | Admitting: Cardiovascular Disease

## 2012-11-11 ENCOUNTER — Ambulatory Visit: Payer: Medicare Other | Admitting: Cardiology

## 2012-11-11 NOTE — Telephone Encounter (Signed)
Dr. Gwenlyn Found notified and advised pt see Dr. Sallyanne Kuster tomorrow.  Reviewed appts and pt scheduled to see Dr. Sallyanne Kuster tomorrow at 9:30am.

## 2012-11-11 NOTE — Telephone Encounter (Signed)
Fax not received.  Call to [facility] to refax report.  Report still not received.  Report printed and placed on Dr. Kennon Holter cart for review.

## 2012-11-11 NOTE — Telephone Encounter (Signed)
Bradycardia at 28 lasting 32 seconds.  Will fax report.

## 2012-11-12 ENCOUNTER — Encounter: Payer: Self-pay | Admitting: Cardiovascular Disease

## 2012-11-12 ENCOUNTER — Ambulatory Visit (INDEPENDENT_AMBULATORY_CARE_PROVIDER_SITE_OTHER): Payer: Medicare Other | Admitting: Cardiovascular Disease

## 2012-11-12 VITALS — BP 136/82 | HR 53 | Resp 16 | Ht 71.0 in | Wt 244.1 lb

## 2012-11-12 DIAGNOSIS — I472 Ventricular tachycardia: Secondary | ICD-10-CM

## 2012-11-12 DIAGNOSIS — D689 Coagulation defect, unspecified: Secondary | ICD-10-CM

## 2012-11-12 DIAGNOSIS — R079 Chest pain, unspecified: Secondary | ICD-10-CM

## 2012-11-12 DIAGNOSIS — I10 Essential (primary) hypertension: Secondary | ICD-10-CM

## 2012-11-12 DIAGNOSIS — R5381 Other malaise: Secondary | ICD-10-CM

## 2012-11-12 DIAGNOSIS — I441 Atrioventricular block, second degree: Secondary | ICD-10-CM

## 2012-11-12 DIAGNOSIS — Z79899 Other long term (current) drug therapy: Secondary | ICD-10-CM

## 2012-11-12 NOTE — Patient Instructions (Addendum)
Your physician has recommended that you have a pacemaker inserted. A pacemaker is a small device that is placed under the skin of your chest or abdomen to help control abnormal heart rhythms. This device uses electrical pulses to prompt the heart to beat at a normal rate. Pacemakers are used to treat heart rhythms that are too slow. Wire (leads) are attached to the pacemaker that goes into the chambers of you heart. This is done in the hospital and usually requires and overnight stay. Please see the instruction sheet given to you today for more information.  Your physician recommends that you return for lab work within 7 days prior to your procedure.

## 2012-11-13 ENCOUNTER — Encounter: Payer: Self-pay | Admitting: Cardiovascular Disease

## 2012-11-13 DIAGNOSIS — I472 Ventricular tachycardia: Secondary | ICD-10-CM | POA: Insufficient documentation

## 2012-11-13 DIAGNOSIS — I4729 Other ventricular tachycardia: Secondary | ICD-10-CM | POA: Insufficient documentation

## 2012-11-13 NOTE — Assessment & Plan Note (Signed)
Good control. Notes that his antihypertensive medications may also cause some degree of postural dizziness severe orthostatic hypotension. This is especially true since he takes a combination of tamsulosin and other antihypertensives. His dizziness may not entirely resolve following pacemaker implantation.

## 2012-11-13 NOTE — Assessment & Plan Note (Signed)
[name] has fairly profound bradycardia that occurs during daytime hours secondary to Mobitz type I second-degree AV block. The QRS complex is narrow suggesting suprahisian block and today's electrocardiogram shows atypical Wenckebach cycle. He has never experienced syncope but is clearly symptomatic with complaints of fatigue, exertional dyspnea and occasional dizziness. He takes antihypertensive medications but none of them have a negative chronotropic effect. She'll benefit from implantation of a dual-chamber permanent pacemaker. There seems to be evidence of underlying sinus bradycardia as well. He may also have chronotropic incompetence. He might do best with a pacemaker with a blended accelerometer and minute ventilation sensor. His AV conduction is very prolonged and he will likely pace frequently even though with moderate algorithms to minimize ventricular pacing.  The indications for pacemaker therapy, its risks and benefits were discussed in detail with the patient. His wife was present at the meeting and asked several salient questions as well. The patient understands the procedure and wants to go ahead with it.

## 2012-11-13 NOTE — Assessment & Plan Note (Signed)
1 asymptomatic event was captured during sleeping hours by his event monitor. It consisted of only 7 beats. He has just undergone a workup with echo and nuclear stress testing that did not show any evidence of major structural heart disease. Once the pacemaker is in place beta blocker therapy may be considered, especially if his pacemaker records repeated episodes of ventricular tachycardia

## 2012-11-13 NOTE — Progress Notes (Signed)
Patient ID: Ricky Barnett, male   DOB: 10/22/1940, 72 y.o.   MRN: [id number]     Reason for office visit Second degree atrioventricular block with symptomatic bradycardia  Ricky Barnett was initially referred for palpitations but also describes symptoms of exertional fatigue, mild exertional dyspnea and occasional dizziness. His dizziness is sometimes but not always related to changes in posture. He has been wearing an event monitor which showed numerous episodes of marked bradycardia even down to less than 30 beats per minute. These are secondary to episodes of Mobitz type I second-degree AV block and 2-1 AV block. They are always associated with a narrow QRS complex. Heart rates as slow as 26 beats a minute have been recorded during sleep, but heart rates of less than 30 beats per minute have been frequently recorded during daytime hours as well. A single 7 beat episode of nonsustained ventricular tachycardia was noted around midnight on September 4. He has never experienced syncope. He has undergone an echocardiogram and nuclear stress test both of which are essentially normal.  His electrocardiogram on August 25 was interpreted as showing junctional rhythm with retrograde conduction but I think actually shows sinus rhythm with a very long PR interval. Today's electrocardiogram clearly shows sinus rhythm with second-degree AV block and Wenckebach cycle. The shortest PR interval is roughly 240 ms. The longest conducted PR interval is well in excess of 460 ms.    No Known Allergies  Current Outpatient Prescriptions  Medication Sig Dispense Refill  . amLODipine-benazepril (LOTREL) 5-20 MG per capsule Take 1 capsule by mouth daily.       Marland Kitchen aspirin 81 MG tablet Take 81 mg by mouth daily.      . metFORMIN (GLUCOPHAGE-XR) 750 MG 24 hr tablet 750 mg 2 (two) times daily.       . pravastatin (PRAVACHOL) 40 MG tablet Take 40 mg by mouth daily.       . Tamsulosin HCl (FLOMAX) 0.4 MG CAPS Take 0.4 mg by mouth  daily after supper.       No current facility-administered medications for this visit.    Past Medical History  Diagnosis Date  . Mastoiditis     right ear  . Hypertension   . Dysrhythmia     2-3 yrs ago  . Diabetes mellitus     was at one time lost weight amd no problem with it now. About 2-3 yrs ago  . Headache(784.0)     occas  . Arthritis     bil knees  . Angina     just started recently ,2-3 days ago-went to see Dr. Einar Gip 05/14/2011  . Palpitations     Past Surgical History  Procedure Laterality Date  . Nasal sinus surgery      left side  . Cystectomy      under chin 1983  . Finger surgery      states he cut finger off when cutting wood  . Tympanoplasty  05/19/2011    Procedure: TYMPANOPLASTY;  Surgeon: Thornell Sartorius, MD;  Location: [facility] OR;  Service: ENT;  Laterality: Right;  Exploratory Tympanotomy    Family History  Problem Relation Age of Onset  . Heart attack Father     History   Social History  . Marital Status: Married    Spouse Name: N/A    Number of Children: N/A  . Years of Education: N/A   Occupational History  . Not on file.   Social History Main Topics  .  Smoking status: Never Smoker   . Smokeless tobacco: Never Used  . Alcohol Use: No  . Drug Use: No  . Sexual Activity: Not on file   Other Topics Concern  . Not on file   Social History Narrative  . No narrative on file    Review of systems: The patient specifically denies any chest pain at rest or with exertion, dyspnea at rest, orthopnea, paroxysmal nocturnal dyspnea, syncope, focal neurological deficits, intermittent claudication, lower extremity edema, unexplained weight gain, cough, hemoptysis or wheezing.  The patient also denies abdominal pain, nausea, vomiting, dysphagia, diarrhea, constipation, polyuria, polydipsia, dysuria, hematuria, frequency, urgency, abnormal bleeding or bruising, fever, chills, unexpected weight changes, mood swings, change in skin or hair texture,  change in voice quality, auditory or visual problems, allergic reactions or rashes, new musculoskeletal complaints other than usual "aches and pains".   PHYSICAL EXAM BP 136/82  Pulse 53  Resp 16  Ht 5\' 11"  (1.803 m)  Wt 244 lb 1.6 oz (110.723 kg)  BMI 34.06 kg/m2  General: Alert, oriented x3, no distress Head: no evidence of trauma, PERRL, EOMI, no exophtalmos or lid lag, no myxedema, no xanthelasma; normal ears, nose and oropharynx Neck: normal jugular venous pulsations and no hepatojugular reflux; brisk carotid pulses without delay and no carotid bruits Chest: clear to auscultation, no signs of consolidation by percussion or palpation, normal fremitus, symmetrical and full respiratory excursions Cardiovascular: normal position and quality of the apical impulse, irregular rhythm, normal first and second heart sounds, no murmurs, rubs or gallops Abdomen: no tenderness or distention, no masses by palpation, no abnormal pulsatility or arterial bruits, normal bowel sounds, no hepatosplenomegaly Extremities: no clubbing, cyanosis or edema; 2+ radial, ulnar and brachial pulses bilaterally; 2+ right femoral, posterior tibial and dorsalis pedis pulses; 2+ left femoral, posterior tibial and dorsalis pedis pulses; no subclavian or femoral bruits Neurological: grossly nonfocal   EKG: Sinus rhythm with second-degree Mobitz type I atrioventricular block  Lipid Panel  No results found for this basename: chol, trig, hdl, cholhdl, vldl, ldlcalc    BMET    Component Value Date/Time   NA 140 05/15/2011 1130   K 3.8 05/15/2011 1130   CL 102 05/15/2011 1130   CO2 29 05/15/2011 1130   GLUCOSE 137* 05/15/2011 1130   BUN 10 05/15/2011 1130   CREATININE 0.95 05/15/2011 1130   CALCIUM 9.2 05/15/2011 1130   GFRNONAA 82* 05/15/2011 1130   GFRAA >90 05/15/2011 1130     ASSESSMENT AND PLAN AV block, Mobitz 1, with HR to 39 Ricky Barnett has fairly profound bradycardia that occurs during daytime hours secondary  to Mobitz type I second-degree AV block. The QRS complex is narrow suggesting suprahisian block and today's electrocardiogram shows atypical Wenckebach cycle. He has never experienced syncope but is clearly symptomatic with complaints of fatigue, exertional dyspnea and occasional dizziness. He takes antihypertensive medications but none of them have a negative chronotropic effect. She'll benefit from implantation of a dual-chamber permanent pacemaker. There seems to be evidence of underlying sinus bradycardia as well. He may also have chronotropic incompetence. He might do best with a pacemaker with a blended accelerometer and minute ventilation sensor. His AV conduction is very prolonged and he will likely pace frequently even though with moderate algorithms to minimize ventricular pacing.  The indications for pacemaker therapy, its risks and benefits were discussed in detail with the patient. His wife was present at the meeting and asked several salient questions as well. The patient understands the procedure  and wants to go ahead with it.   NSVT (nonsustained ventricular tachycardia) 1 asymptomatic event was captured during sleeping hours by his event monitor. It consisted of only 7 beats. He has just undergone a workup with echo and nuclear stress testing that did not show any evidence of major structural heart disease. Once the pacemaker is in place beta blocker therapy may be considered, especially if his pacemaker records repeated episodes of ventricular tachycardia  Essential hypertension Good control. Notes that his antihypertensive medications may also cause some degree of postural dizziness severe orthostatic hypotension. This is especially true since he takes a combination of tamsulosin and other antihypertensives. His dizziness may not entirely resolve following pacemaker implantation.  Orders Placed This Encounter  Procedures  . Comp Met (CMET)  . INR/PT  . PTT  . TSH  . CBC  . EKG  12-Lead  . PERMANENT PACEMAKER INSERTION   Matther Labell  Sanda Klein, MD, [facility] [place] [phone] office [phone] pager

## 2012-11-15 ENCOUNTER — Telehealth: Payer: Self-pay | Admitting: Cardiovascular Disease

## 2012-11-15 NOTE — Telephone Encounter (Signed)
Returned call.  Pt stated he received a call from [place] telling him to turn the monitor off, but he already did.  Stated he took the battery out and turned everything off.  Stated it's already in the bag and will be picked up tomorrow.  Pt advised to ship as planned.  Pt verbalized understanding and agreed w/ plan.

## 2012-11-15 NOTE — Telephone Encounter (Signed)
Pt shut his monitor off and [employer]ionet is telling him to shut it off. He packed it up and needs a phone number to call them back. Pt has already called [employer] to come pick it up.

## 2012-11-19 ENCOUNTER — Encounter (HOSPITAL_COMMUNITY): Payer: Self-pay | Admitting: Pharmacy Technician

## 2012-11-22 LAB — COMPREHENSIVE METABOLIC PANEL
Albumin: 4 g/dL (ref 3.5–5.2)
CO2: 28 mEq/L (ref 19–32)
Calcium: 8.9 mg/dL (ref 8.4–10.5)
Chloride: 101 mEq/L (ref 96–112)
Glucose, Bld: 98 mg/dL (ref 70–99)
Sodium: 138 mEq/L (ref 135–145)
Total Bilirubin: 0.5 mg/dL (ref 0.3–1.2)
Total Protein: 7.2 g/dL (ref 6.0–8.3)

## 2012-11-22 LAB — CBC
HCT: 40.9 % (ref 39.0–52.0)
MCH: 25.5 pg — ABNORMAL LOW (ref 26.0–34.0)
MCHC: 33.3 g/dL (ref 30.0–36.0)
MCV: 76.7 fL — ABNORMAL LOW (ref 78.0–100.0)
Platelets: 327 10*3/uL (ref 150–400)
RDW: 14.4 % (ref 11.5–15.5)
WBC: 5.4 10*3/uL (ref 4.0–10.5)

## 2012-11-23 LAB — PROTIME-INR
INR: 0.94 (ref <1.50)
Prothrombin Time: 12.6 seconds (ref 11.6–15.2)

## 2012-11-26 ENCOUNTER — Other Ambulatory Visit: Payer: Self-pay | Admitting: *Deleted

## 2012-11-26 DIAGNOSIS — I441 Atrioventricular block, second degree: Secondary | ICD-10-CM

## 2012-11-26 DIAGNOSIS — R001 Bradycardia, unspecified: Secondary | ICD-10-CM

## 2012-11-28 MED ORDER — GENTAMICIN SULFATE 40 MG/ML IJ SOLN
80.0000 mg | INTRAMUSCULAR | Status: DC
  Filled 2012-11-28: qty 2

## 2012-11-28 MED ORDER — CEFAZOLIN SODIUM-DEXTROSE 2-3 GM-% IV SOLR
2.0000 g | INTRAVENOUS | Status: DC
Start: 2012-11-28 — End: 2012-11-29
  Filled 2012-11-28: qty 50

## 2012-11-29 ENCOUNTER — Encounter (HOSPITAL_COMMUNITY)
Admission: RE | Disposition: A | Discharge status: Home / Self Care | Payer: Self-pay | Source: Ambulatory Visit | Attending: Cardiovascular Disease

## 2012-11-29 ENCOUNTER — Ambulatory Visit (HOSPITAL_COMMUNITY)
Admission: RE | Admit: 2012-11-29 | Discharge: 2012-11-30 | Disposition: A | Discharge status: Home / Self Care | Payer: Medicare Other | Source: Ambulatory Visit | Attending: Cardiovascular Disease | Admitting: Cardiovascular Disease

## 2012-11-29 ENCOUNTER — Other Ambulatory Visit: Payer: Self-pay | Admitting: Cardiovascular Disease

## 2012-11-29 ENCOUNTER — Encounter (HOSPITAL_COMMUNITY): Payer: Self-pay | Admitting: General Practice

## 2012-11-29 ENCOUNTER — Ambulatory Visit: Payer: Medicare Other | Admitting: Cardiovascular Disease

## 2012-11-29 DIAGNOSIS — I471 Supraventricular tachycardia: Secondary | ICD-10-CM | POA: Diagnosis present

## 2012-11-29 DIAGNOSIS — I10 Essential (primary) hypertension: Secondary | ICD-10-CM | POA: Insufficient documentation

## 2012-11-29 DIAGNOSIS — R001 Bradycardia, unspecified: Secondary | ICD-10-CM

## 2012-11-29 DIAGNOSIS — E119 Type 2 diabetes mellitus without complications: Secondary | ICD-10-CM | POA: Insufficient documentation

## 2012-11-29 DIAGNOSIS — I441 Atrioventricular block, second degree: Secondary | ICD-10-CM | POA: Insufficient documentation

## 2012-11-29 DIAGNOSIS — I472 Ventricular tachycardia: Secondary | ICD-10-CM | POA: Diagnosis present

## 2012-11-29 DIAGNOSIS — I495 Sick sinus syndrome: Secondary | ICD-10-CM | POA: Insufficient documentation

## 2012-11-29 DIAGNOSIS — R002 Palpitations: Secondary | ICD-10-CM

## 2012-11-29 DIAGNOSIS — Z950 Presence of cardiac pacemaker: Secondary | ICD-10-CM

## 2012-11-29 HISTORY — PX: INSERT / REPLACE / REMOVE PACEMAKER: SUR710

## 2012-11-29 HISTORY — PX: PERMANENT PACEMAKER INSERTION: SHX5480

## 2012-11-29 HISTORY — DX: Presence of cardiac pacemaker: Z95.0

## 2012-11-29 LAB — GLUCOSE, CAPILLARY
Glucose-Capillary: 122 mg/dL — ABNORMAL HIGH (ref 70–99)
Glucose-Capillary: 89 mg/dL (ref 70–99)

## 2012-11-29 SURGERY — PERMANENT PACEMAKER INSERTION
Anesthesia: LOCAL

## 2012-11-29 MED ORDER — LIDOCAINE HCL (PF) 1 % IJ SOLN
INTRAMUSCULAR | Status: AC
  Filled 2012-11-29: qty 60

## 2012-11-29 MED ORDER — ASPIRIN 81 MG PO TABS: 81.0000 mg | ORAL_TABLET | Freq: Every day | ORAL | Status: DC

## 2012-11-29 MED ORDER — TAMSULOSIN HCL 0.4 MG PO CAPS
0.4000 mg | ORAL_CAPSULE | Freq: Every day | ORAL | Status: DC
  Administered 2012-11-29: 0.4 mg via ORAL
  Filled 2012-11-29 (×2): qty 1

## 2012-11-29 MED ORDER — SODIUM CHLORIDE 0.9 % IV SOLN: INTRAVENOUS | Status: DC

## 2012-11-29 MED ORDER — HYDROCODONE-ACETAMINOPHEN 5-325 MG PO TABS
1.0000 | ORAL_TABLET | ORAL | Status: DC | PRN
  Administered 2012-11-29: 2 via ORAL
  Administered 2012-11-29: 1 via ORAL
  Administered 2012-11-30: 2 via ORAL
  Filled 2012-11-29: qty 1
  Filled 2012-11-29 (×2): qty 2
  Filled 2012-11-29: qty 1

## 2012-11-29 MED ORDER — MUPIROCIN 2 % EX OINT
TOPICAL_OINTMENT | CUTANEOUS | Status: AC
  Administered 2012-11-29: 1
  Filled 2012-11-29: qty 22

## 2012-11-29 MED ORDER — ACETAMINOPHEN 325 MG PO TABS: 325.0000 mg | ORAL_TABLET | ORAL | Status: DC | PRN

## 2012-11-29 MED ORDER — SODIUM CHLORIDE 0.9 % IV SOLN
INTRAVENOUS | Status: DC
  Administered 2012-11-29: 09:00:00 via INTRAVENOUS

## 2012-11-29 MED ORDER — MUPIROCIN 2 % EX OINT
TOPICAL_OINTMENT | Freq: Two times a day (BID) | CUTANEOUS | Status: DC
  Administered 2012-11-30 (×2): via NASAL
  Filled 2012-11-29 (×2): qty 22

## 2012-11-29 MED ORDER — ONDANSETRON HCL 4 MG/2ML IJ SOLN
4.0000 mg | Freq: Four times a day (QID) | INTRAMUSCULAR | Status: DC | PRN
  Administered 2012-11-30: 4 mg via INTRAVENOUS
  Filled 2012-11-29: qty 2

## 2012-11-29 MED ORDER — FENTANYL CITRATE 0.05 MG/ML IJ SOLN
INTRAMUSCULAR | Status: AC
  Filled 2012-11-29: qty 2

## 2012-11-29 MED ORDER — INFLUENZA VAC SPLIT QUAD 0.5 ML IM SUSP
0.5000 mL | INTRAMUSCULAR | Status: AC
  Administered 2012-11-30: 0.5 mL via INTRAMUSCULAR
  Filled 2012-11-29: qty 0.5

## 2012-11-29 MED ORDER — AMLODIPINE BESYLATE 5 MG PO TABS
5.0000 mg | ORAL_TABLET | Freq: Every day | ORAL | Status: DC
  Administered 2012-11-29 – 2012-11-30 (×2): 5 mg via ORAL
  Filled 2012-11-29 (×2): qty 1

## 2012-11-29 MED ORDER — MIDAZOLAM HCL 5 MG/5ML IJ SOLN
INTRAMUSCULAR | Status: AC
  Filled 2012-11-29: qty 5

## 2012-11-29 MED ORDER — BENAZEPRIL HCL 20 MG PO TABS
20.0000 mg | ORAL_TABLET | Freq: Every day | ORAL | Status: DC
  Administered 2012-11-29 – 2012-11-30 (×2): 20 mg via ORAL
  Filled 2012-11-29 (×2): qty 1

## 2012-11-29 MED ORDER — CEFAZOLIN SODIUM 1-5 GM-% IV SOLN
1.0000 g | Freq: Four times a day (QID) | INTRAVENOUS | Status: AC
  Administered 2012-11-29 – 2012-11-30 (×3): 1 g via INTRAVENOUS
  Filled 2012-11-29 (×3): qty 50

## 2012-11-29 MED ORDER — HEPARIN (PORCINE) IN NACL 2-0.9 UNIT/ML-% IJ SOLN
INTRAMUSCULAR | Status: AC
  Filled 2012-11-29: qty 500

## 2012-11-29 MED ORDER — METFORMIN HCL ER 750 MG PO TB24
750.0000 mg | ORAL_TABLET | Freq: Two times a day (BID) | ORAL | Status: DC
  Filled 2012-11-29 (×2): qty 1

## 2012-11-29 MED ORDER — ASPIRIN EC 81 MG PO TBEC
81.0000 mg | DELAYED_RELEASE_TABLET | Freq: Every day | ORAL | Status: DC
  Administered 2012-11-29 – 2012-11-30 (×2): 81 mg via ORAL
  Filled 2012-11-29 (×2): qty 1

## 2012-11-29 NOTE — Op Note (Signed)
Procedure report  Procedure performed:  1. Implantation of new dual chamber permanent pacemaker 2. Fluoroscopy 3. Light sedation  Reason for procedure: Symptomatic bradycardia due to: Sinus node dysfunction Second degree atrioventricular block Mobitz type I    Procedure performed by: Sanda Klein, MD  Complications: None  Estimated blood loss: <10 mL  Medications administered during procedure: Ancef 2 g intravenously Lidocaine 1% 30 mL locally,  Fentanyl 75 mcg intravenously Versed 3 mg intravenously  Device details: [employer] Advantio model [id number] DR EL serial number [id number] Right atrial lead Guidant Dextrus [id number] serial number [id number] Right ventricular lead Guidant Dextrus 413serial number [id number]  Procedure details:  After the risks and benefits of the procedure were discussed the patient provided informed consent and was brought to the cardiac cath lab in the fasting state. The patient was prepped and draped in usual sterile fashion. Local anesthesia with 1% lidocaine was administered to to the left infraclavicular area. A 5-6 cm horizontal incision was made parallel with and 2-3 cm caudal to the left clavicle. Using electrocautery and blunt dissection a prepectoral pocket was created down to the level of the pectoralis major muscle fascia. The pocket was carefully inspected for hemostasis. An antibiotic-soaked sponge was placed in the pocket.  Under fluoroscopic guidance and using the modified Seldinger technique 2 separate venipunctures were performed to access the left subclavian vein. No difficulty was encountered accessing the vein.  Two J-tip guidewires were subsequently exchanged for two 7 French safe sheaths.  Under fluoroscopic guidance the ventricular lead was advanced to level of the mid to apical right ventricular septum and thet active-fixation helix was deployed. Prominent current of injury was seen. Satisfactory pacing and sensing parameters  were recorded. There was no evidence of diaphragmatic stimulation at maximum device output. The safe sheath was peeled away and the lead was secured in place with 2-0 silk.  In similar fashion the right atrial lead was advanced to the level of the atrial appendage. The active-fixation helix was deployed. There was prominent current of injury. Satisfactory  pacing and sensing parameters were recorded. There was no evidence of diaphragmatic stimulation with pacing at maximum device output. The safe sheath was peeled away and the lead was secured in place with 2-0 silk.  The antibiotic-soaked sponge was removed from the pocket. The pocket was flushed with copious amounts of antibiotic solution. Reinspection showed excellent hemostasis..  The ventricular lead was connected to the generator and appropriate ventricular pacing was seen. Subsequently the atrial lead was also connected. Repeat testing of the lead parameters later showed excellent values.  The entire system was then carefully inserted in the pocket with care been taking that the leads and device assumed a comfortable position without pressure on the incision. Great care was taken that the leads be located deep to the generator. The pocket was then closed in layers using 2 layers of 2-0 Vicryl and cutaneous staples, after which a sterile dressing was applied.  At the end of the procedure the following lead parameters were encountered:  Right atrial lead  sensed P waves 5.1 mV, impedance 575 ohms, threshold 0.5 V at 0.5 ms pulse width.  Right ventricular lead sensed R waves 16.1 mV, impedance 841ohms, threshold 0.9 V at 0.5 ms pulse width.   Sanda Klein, MD, [place] [phone] office [phone] pager

## 2012-11-29 NOTE — H&P (Signed)
.  Date of Initial H&P: 11/12/2012  History reviewed, patient examined, no change in status, stable for surgery. Here for dual chamber pacemaker for severe symptomatic bradycardia due to second degree AV block, Mobitz type I and probable superimposed sinus node dysfunction/chronotropic incompetence. Here for dual chamber pacemaker implantation. This procedure has been fully reviewed with the patient and written informed consent has been obtained.   Sanda Klein, MD, [place] [phone] office [phone] pager

## 2012-11-30 ENCOUNTER — Ambulatory Visit (HOSPITAL_COMMUNITY): Payer: Medicare Other

## 2012-11-30 DIAGNOSIS — Z950 Presence of cardiac pacemaker: Secondary | ICD-10-CM | POA: Diagnosis present

## 2012-11-30 DIAGNOSIS — I441 Atrioventricular block, second degree: Secondary | ICD-10-CM

## 2012-11-30 LAB — GLUCOSE, CAPILLARY: Glucose-Capillary: 116 mg/dL — ABNORMAL HIGH (ref 70–99)

## 2012-11-30 MED ORDER — ACETAMINOPHEN 325 MG PO TABS: 325.0000 mg | ORAL_TABLET | ORAL | Status: DC | PRN

## 2012-11-30 MED ORDER — MUPIROCIN 2 % EX OINT: TOPICAL_OINTMENT | Freq: Two times a day (BID) | CUTANEOUS | Status: DC

## 2012-11-30 MED ORDER — HYDROCODONE-ACETAMINOPHEN 5-325 MG PO TABS: 1.0000 | ORAL_TABLET | ORAL | Status: DC | PRN

## 2012-11-30 NOTE — Progress Notes (Signed)
Reason for procedure:  Symptomatic bradycardia due to:  Sinus node dysfunction  Second degree atrioventricular block Mobitz type I   Subjective:  No complaints Objective: Vital signs in last 24 hours: Temp:  [97.8 F (36.6 C)-98.9 F (37.2 C)] 98.6 F (37 C) (09/30 0528) Pulse Rate:  [62-70] 67 (09/30 0528) Resp:  [18] 18 (09/30 0528) BP: (115-164)/(69-89) 150/69 mmHg (09/30 0528) SpO2:  [91 %-100 %] 98 % (09/30 0528) Weight change:    Intake/Output from previous day: 09/29 0701 - 09/30 0700 In: 730 [P.O.:480; I.V.:200; IV Piggyback:50] Out: 700 [Urine:700] Intake/Output this shift: Total I/O In: 240 [P.O.:240] Out: 200 [Urine:200]   Device details:  [employer] Advantio model [id number] DR EL serial number [id number]  Right atrial lead Guidant Dextrus 4136 serial number [id number]  Right ventricular lead Guidant Dextrus 413serial number [id number]  PE: General:Pleasant affect, NAD Skin:Warm and dry, brisk capillary refill HEENT:normocephalic, sclera clear, mucus membranes moist Pacer site pressure dressing removed.  No bleeding no swelling Heart:S1S2 RRR without murmur, gallup, rub or click Lungs:clear without rales, rhonchi, or wheezes [id number], non tender, + BS, do not palpate liver spleen or masses Ext:no lower ext edema, 2+ pedal pulses, 2+ radial pulses Neuro:alert and oriented, MAE, follows commands, + facial symmetry   Lab Results: No results found for this basename: WBC, HGB, HCT, PLT,  in the last 72 hours BMET No results found for this basename: NA, K, CL, CO2, GLUCOSE, BUN, CREATININE, CALCIUM, MAGNESIUM,  in the last 72 hours No results found for this basename: TROPONINI, CK, MB,  in the last 72 hours  No results found for this basename: CHOL,  HDL,  LDLCALC,  LDLDIRECT,  TRIG,  CHOLHDL   No results found for this basename: HGBA1C     Lab Results  Component Value Date   TSH 2.245 11/22/2012    Hepatic Function Panel No results found for  this basename: PROT, ALBUMIN, AST, ALT, ALKPHOS, BILITOT, BILIDIR, IBILI,  in the last 72 hours No results found for this basename: CHOL,  in the last 72 hours No results found for this basename: PROTIME,  in the last 72 hours      Studies/Results: Dg Chest 2 View  11/30/2012   *RADIOLOGY REPORT*  Clinical Data: Pacemaker  CHEST - 2 VIEW  Comparison: Prior radiograph from 05/15/2011  Findings: Normal placement of a dual lead left-sided transvenous pacemaker / AICD with electrodes overlying the right ventricle and right atrium.  Cardiac and mediastinal silhouettes are unchanged, with persistent mild cardiomegaly.  Lungs are normally inflated.  There is no pneumothorax.  No airspace consolidation or pleural effusion.  No pulmonary edema.  Osseous structures are within normal limits.  IMPRESSION: Interval placement of dual lead left-sided transvenous pacemaker / AICD without complication.  No pneumothorax identified.   Original Report Authenticated By: Jeannine Boga, M.D.    Medications: I have reviewed the patient's current medications. Scheduled Meds: . amLODipine  5 mg Oral Daily  . aspirin EC  81 mg Oral Daily  . benazepril  20 mg Oral Daily  . influenza vac split quadrivalent PF  0.5 mL Intramuscular Tomorrow-1000  . mupirocin ointment   Nasal BID  . tamsulosin  0.4 mg Oral QPC supper   Continuous Infusions:  PRN Meds:.acetaminophen, HYDROcodone-acetaminophen, ondansetron (ZOFRAN) IV  Assessment/Plan: Principal Problem:   AV block, Mobitz 1, with HR to 39 Active Problems:   Essential hypertension   Diabetes   AV junctional tachycardia   NSVT (nonsustained ventricular tachycardia)  PLAN: ambulate and discharge.  Follow up with APP in 1 week then Dr. Sallyanne Kuster in 4 weeks.  Pacer interrogated without problems.   LOS: 1 day   Time spent with pt. :15 minutes. [facility] R  Nurse Practitioner Certified Pager [phone] 11/30/2012, 10:19 AM

## 2012-11-30 NOTE — Progress Notes (Signed)
Pt. Seen and examined. Agree with the NP/PA-C note as written. Doing well post-pacemaker. Lead impedances look appropriate. CXR does not show PTX. No pocket hematoma. He reports he felt "immediately" better after the procedure. [place] for d/c home today. Follow-up with Dr. Drue Stager, MD, [facility] Attending Cardiologist [facility] [place]

## 2012-11-30 NOTE — Discharge Summary (Signed)
Physician Discharge Summary      Patient ID: Ricky Barnett MRN: [id number] DOB/AGE: [birth year] 72 y.o.  Admit date: 11/29/2012 Discharge date: 11/30/2012  Discharge Diagnoses:  Principal Problem:   AV block, Mobitz 1, with HR to 39 Active Problems:   Essential hypertension   Diabetes   AV junctional tachycardia   NSVT (nonsustained ventricular tachycardia)   Discharged Condition: good  Procedures: Device details:  [place] [id number] DR EL serial number [id number]  Right atrial lead Guidant Dextrus 4136 serial number [id number]  Right ventricular lead Guidant Dextrus 413serial number [id number] Placed 11/29/12  By Dr. Delila Pereyra Course: Ricky Barnett was initially referred for palpitations but also describes symptoms of exertional fatigue, mild exertional dyspnea and occasional dizziness. His dizziness is sometimes but not always related to changes in posture. He has been wearing an event monitor which showed numerous episodes of marked bradycardia even down to less than 30 beats per minute. These are secondary to episodes of Mobitz type I second-degree AV block and 2-1 AV block. They are always associated with a narrow QRS complex. Heart rates as slow as 26 beats a minute have been recorded during sleep, but heart rates of less than 30 beats per minute have been frequently recorded during daytime hours as well. A single 7 beat episode of nonsustained ventricular tachycardia was noted around midnight on September 4. He has never experienced syncope. He has undergone an echocardiogram and nuclear stress test both of which are essentially normal.  His electrocardiogram on August 25 was interpreted as showing junctional rhythm with retrograde conduction but I think actually shows sinus rhythm with a very long PR interval. Today's electrocardiogram clearly shows sinus rhythm with second-degree AV block and Wenckebach cycle. The shortest PR interval is roughly 240 ms. The  longest conducted PR interval is well in excess of 460 ms.  Patient was brought in electively for permanent pacemaker for symptomatic bradycardia due to sinus node dysfunction and second-degree A-V block Mobitz type I.  Patient tolerated procedure without complications next morning his chest x-ray was without pneumothorax and no complaints.  He ambulated without complications and was ready for discharge home. He was seen and evaluated by Dr. Debara Pickett who found the patient to be stable and agreed with the discharge.  Pacemaker was also interrogated by device rapid without complications.  Pacemaker was working appropriately.   Consults: None  Significant Diagnostic Studies:  BMET    Component Value Date/Time   NA 138 11/22/2012 1120   K 3.9 11/22/2012 1120   CL 101 11/22/2012 1120   CO2 28 11/22/2012 1120   GLUCOSE 98 11/22/2012 1120   BUN 11 11/22/2012 1120   CREATININE 0.96 11/22/2012 1120   CREATININE 0.95 05/15/2011 1130   CALCIUM 8.9 11/22/2012 1120   GFRNONAA 82* 05/15/2011 1130   GFRAA >90 05/15/2011 1130    CBC    Component Value Date/Time   WBC 5.4 11/22/2012 1120   RBC 5.33 11/22/2012 1120   HGB 13.6 11/22/2012 1120   HCT 40.9 11/22/2012 1120   PLT 327 11/22/2012 1120   MCV 76.7* 11/22/2012 1120   MCH 25.5* 11/22/2012 1120   MCHC 33.3 11/22/2012 1120   RDW 14.4 11/22/2012 1120   LYMPHSABS 2.5 05/15/2011 1130   MONOABS 0.6 05/15/2011 1130   EOSABS 0.1 05/15/2011 1130   BASOSABS 0.0 05/15/2011 1130     CHEST - 2 VIEW Comparison: Prior radiograph from 05/15/2011 Findings: Normal placement of a dual lead left-sided  transvenous pacemaker / AICD with electrodes overlying the right ventricle and right atrium. Cardiac and mediastinal silhouettes are unchanged, with persistent mild cardiomegaly. Lungs are normally inflated. There is no pneumothorax. No airspace consolidation or pleural effusion. No pulmonary edema. Osseous structures are within normal limits.  IMPRESSION: Interval placement  of dual lead left-sided transvenous pacemaker / AICD without complication. No pneumothorax identified.        Discharge Exam: Blood pressure 150/69, pulse 67, temperature 98.6 F (37 C), temperature source Oral, resp. rate 18, height 5\' 11"  (1.803 m), weight 246 lb (111.585 kg), SpO2 98.00%.  PE: General:Pleasant affect, NAD  Skin:Warm and dry, brisk capillary refill  HEENT:normocephalic, sclera clear, mucus membranes moist  Pacer site pressure dressing removed. No bleeding no swelling  Heart:S1S2 RRR without murmur, gallup, rub or click  Lungs:clear without rales, rhonchi, or wheezes  [id number], non tender, + BS, do not palpate liver spleen or masses  Ext:no lower ext edema, 2+ pedal pulses, 2+ radial pulses  Neuro:alert and oriented, MAE, follows commands, + facial symmetry   Disposition: 01-Home or Self Care   Future Appointments Provider Department Dept Phone   12/13/2012 1:45 PM Lorretta Harp, MD [facility] [phone]       Medication List         acetaminophen 325 MG tablet  Commonly known as:  TYLENOL  Take 1-2 tablets (325-650 mg total) by mouth every 4 (four) hours as needed.     amLODipine-benazepril 5-20 MG per capsule  Commonly known as:  LOTREL  Take 1 capsule by mouth daily.     aspirin 81 MG tablet  Take 81 mg by mouth daily.     HYDROcodone-acetaminophen 5-325 MG per tablet  Commonly known as:  NORCO/VICODIN  Take 1-2 tablets by mouth every 4 (four) hours as needed for pain.     metFORMIN 750 MG 24 hr tablet  Commonly known as:  GLUCOPHAGE-XR  Take 750 mg by mouth 2 (two) times daily.     mupirocin ointment 2 %  Commonly known as:  BACTROBAN  Apply topically 2 (two) times daily.     pravastatin 40 MG tablet  Commonly known as:  PRAVACHOL  Take 40 mg by mouth daily.     tamsulosin 0.4 MG Caps capsule  Commonly known as:  FLOMAX  Take 0.4 mg by mouth daily after supper.           Follow-up Information   Follow up with  [facility] R, NP On . (the office will call with date and time)    Specialty:  Cardiology   Contact information:   [address] [place] [address] [phone]     Discharge instructions:   Pacemaker instructions Heart Healthy Diet.  Signed: Isaiah Serge Nurse Practitioner-Certified [facility] 11/30/2012, 11:33 AM  Time spent on discharge :35 minutes.

## 2012-12-06 ENCOUNTER — Ambulatory Visit (INDEPENDENT_AMBULATORY_CARE_PROVIDER_SITE_OTHER): Payer: Medicare Other | Admitting: Cardiology

## 2012-12-06 ENCOUNTER — Encounter: Payer: Self-pay | Admitting: Cardiology

## 2012-12-06 VITALS — BP 130/78 | HR 72 | Ht 71.0 in | Wt 243.0 lb

## 2012-12-06 DIAGNOSIS — Z950 Presence of cardiac pacemaker: Secondary | ICD-10-CM

## 2012-12-06 DIAGNOSIS — I441 Atrioventricular block, second degree: Secondary | ICD-10-CM

## 2012-12-06 NOTE — Patient Instructions (Signed)
Follow up with Dr. Sallyanne Kuster in 1 month.    Call if any swelling at pacer site or pain.  Move your Lt arm more, just not over your head.  You may wish to wear sling at night to remind you not to raise your arm.

## 2012-12-06 NOTE — Assessment & Plan Note (Signed)
Resolved with pacemaker

## 2012-12-06 NOTE — Progress Notes (Signed)
12/06/2012   PCP: Merrilee Seashore, MD   Chief Complaint  Patient presents with  . Follow-up    site check    Primary Cardiologist: Dr. Gwenlyn Found  HPI: 64 yoM here today for pacer site check.  Had PPM - [employer] placed 11/29/12.  Today he has no complaints, no chest pain no shortness of breath.    No Known Allergies  Current Outpatient Prescriptions  Medication Sig Dispense Refill  . acetaminophen (TYLENOL) 325 MG tablet Take 1-2 tablets (325-650 mg total) by mouth every 4 (four) hours as needed.      Marland Kitchen amLODipine-benazepril (LOTREL) 5-20 MG per capsule Take 1 capsule by mouth daily.       Marland Kitchen aspirin 81 MG tablet Take 81 mg by mouth daily.      Marland Kitchen HYDROcodone-acetaminophen (NORCO/VICODIN) 5-325 MG per tablet Take 1-2 tablets by mouth every 4 (four) hours as needed for pain.  15 tablet  0  . metFORMIN (GLUCOPHAGE-XR) 750 MG 24 hr tablet Take 750 mg by mouth 2 (two) times daily.       . pravastatin (PRAVACHOL) 40 MG tablet Take 40 mg by mouth daily.       . Tamsulosin HCl (FLOMAX) 0.4 MG CAPS Take 0.4 mg by mouth daily after supper.      . mupirocin ointment (BACTROBAN) 2 % Apply topically 2 (two) times daily.  22 g  0   No current facility-administered medications for this visit.    Past Medical History  Diagnosis Date  . Mastoiditis     right ear  . Hypertension   . Dysrhythmia     2-3 yrs ago  . Diabetes mellitus     was at one time lost weight amd no problem with it now. About 2-3 yrs ago  . Headache(784.0)     occas  . Arthritis     bil knees  . Angina     just started recently ,2-3 days ago-went to see Dr. Einar Gip 05/14/2011  . Palpitations   . Pacemaker 11/29/2012    dual chamber    Past Surgical History  Procedure Laterality Date  . Nasal sinus surgery      left side  . Cystectomy      under chin 1983  . Finger surgery      states he cut finger off when cutting wood  . Tympanoplasty  05/19/2011    Procedure: TYMPANOPLASTY;  Surgeon:  Thornell Sartorius, MD;  Location: [place];  Service: ENT;  Laterality: Right;  Exploratory Tympanotomy  . Insert / replace / remove pacemaker  11/29/2012    dual chamber    [id number] colds or fevers, no weight changes Skin:no rashes or ulcers Neuro:no syncope, no lightheadedness   PHYSICAL EXAM BP 130/78  Pulse 72  Ht 5\' 11"  (1.803 m)  Wt 243 lb (110.224 kg)  BMI 33.91 kg/m2 General:Pleasant affect, NAD Heart:S1S2 RRR without murmur, gallup, rub or click; site of PPM well approximated, no swelling, no drainage.  Staples removed without complications, site cleaned with betadine.  Steri strips applied may remove in 3-4 days. Lungs:clear without rales, rhonchi, or wheezes Neuro:alert and oriented, MAE, follows commands, + facial symmetry   ASSESSMENT AND PLAN Pacemaker Generator [employer] Advantio model K064 DR EL serial number [id number]  Placed 11/29/2012 - Dr. Sallyanne Kuster Pt has done well, no chest pain, no shortness of breath.  Staples removed.  Pt to follow up with Dr. Sallyanne Kuster for pacer adjustment  in 1 month.  AV block, Mobitz 1, with HR to 39 Resolved with pacemaker

## 2012-12-06 NOTE — Assessment & Plan Note (Signed)
Generator [employer] Advantio model [id number] DR EL serial number [id number]  Placed 11/29/2012 - Dr. Sallyanne Kuster Pt has done well, no chest pain, no shortness of breath.  Staples removed.  Pt to follow up with Dr. Sallyanne Kuster for pacer adjustment in 1 month.

## 2012-12-13 ENCOUNTER — Ambulatory Visit: Payer: Medicare Other | Admitting: Cardiovascular Disease

## 2013-01-06 ENCOUNTER — Ambulatory Visit (INDEPENDENT_AMBULATORY_CARE_PROVIDER_SITE_OTHER): Payer: Medicare Other | Admitting: Cardiovascular Disease

## 2013-01-06 ENCOUNTER — Encounter: Payer: Self-pay | Admitting: Cardiovascular Disease

## 2013-01-06 VITALS — BP 141/78 | HR 71 | Resp 16 | Ht 71.0 in | Wt 244.5 lb

## 2013-01-06 DIAGNOSIS — Z950 Presence of cardiac pacemaker: Secondary | ICD-10-CM

## 2013-01-06 DIAGNOSIS — N529 Male erectile dysfunction, unspecified: Secondary | ICD-10-CM

## 2013-01-06 DIAGNOSIS — I441 Atrioventricular block, second degree: Secondary | ICD-10-CM

## 2013-01-06 DIAGNOSIS — I498 Other specified cardiac arrhythmias: Secondary | ICD-10-CM

## 2013-01-06 DIAGNOSIS — I440 Atrioventricular block, first degree: Secondary | ICD-10-CM

## 2013-01-06 DIAGNOSIS — I472 Ventricular tachycardia: Secondary | ICD-10-CM

## 2013-01-06 DIAGNOSIS — I471 Supraventricular tachycardia: Secondary | ICD-10-CM

## 2013-01-06 MED ORDER — SILDENAFIL CITRATE 100 MG PO TABS: 100.0000 mg | ORAL_TABLET | Freq: Every day | ORAL | Status: DC | PRN

## 2013-01-06 NOTE — Patient Instructions (Signed)
Remote monitoring is used to monitor your Pacemaker of ICD from home. This monitoring reduces the number of office visits required to check your device to one time per year. It allows [place] to keep an eye on the functioning of your device to ensure it is working properly. You are scheduled for a device check from home on April 11, 2013.  You may send your transmission at any time that day. If you have a wireless device, the transmission will be sent automatically. After your physician reviews your transmission, you will receive a postcard with your next transmission date.  Your physician recommends that you schedule a follow-up appointment in: 6 months.  A prescription for Viagra 100mg  take as neededs.

## 2013-01-09 ENCOUNTER — Encounter: Payer: Self-pay | Admitting: Cardiovascular Disease

## 2013-01-09 DIAGNOSIS — N529 Male erectile dysfunction, unspecified: Secondary | ICD-10-CM | POA: Insufficient documentation

## 2013-01-09 NOTE — Assessment & Plan Note (Signed)
He tells me he has taken erectile dysfunction medications in the past with good results and without side effects. Gave him a prescription for Viagra. Reminded him about the potential for deadly interaction with nitrates.

## 2013-01-09 NOTE — Assessment & Plan Note (Signed)
Used the pacemaker to monitor for recurrence. No major structural cardiac abnormalities by recent echo and stress testing. Likely to be low risk.

## 2013-01-09 NOTE — Assessment & Plan Note (Signed)
Enroll in LAD acute remote pacemaker monitoring. May need to adjust rate response settings after we have 3- 6 months worth of data. For now seems to have a good response to treatmen.

## 2013-01-09 NOTE — Progress Notes (Signed)
Patient ID: ZIV BURUCA, male   DOB: 09-Jun-1940, 72 y.o.   MRN: [id number]      Reason for office visit Pacemaker followup  Mr. Carpio returns in followup roughly one month following implantation of a dual-chamber permanent pacemaker for symptomatic bradycardia secondary to second degree AV block Mobitz type I. He received a [employer] advantio [id number] dual-chamber device. He has no issues at the surgical site. He already feels much more energetic than before pacemaker implantation. Review of his heart rate histograms shows that there may be room for improvement of his rate response setting, but we need to accumulate more data first. Pacemaker lead parameters are excellent. The device is reprogrammed to chronic settings. He requested a flu shot today which was administered. He inquires about the safety of using medications for erectile dysfunction.  No Known Allergies  Current Outpatient Prescriptions  Medication Sig Dispense Refill  . acetaminophen (TYLENOL) 325 MG tablet Take 1-2 tablets (325-650 mg total) by mouth every 4 (four) hours as needed.      Marland Kitchen amLODipine-benazepril (LOTREL) 5-20 MG per capsule Take 1 capsule by mouth daily.       . metFORMIN (GLUCOPHAGE-XR) 750 MG 24 hr tablet Take 750 mg by mouth 2 (two) times daily.       . Multiple Vitamin (MULTIVITAMIN) tablet Take 1 tablet by mouth daily.      . pravastatin (PRAVACHOL) 40 MG tablet Take 40 mg by mouth daily.       . Tamsulosin HCl (FLOMAX) 0.4 MG CAPS Take 0.4 mg by mouth daily after supper.      . sildenafil (VIAGRA) 100 MG tablet Take 1 tablet (100 mg total) by mouth daily as needed for erectile dysfunction.  10 tablet  1   No current facility-administered medications for this visit.    Past Medical History  Diagnosis Date  . Mastoiditis     right ear  . Hypertension   . Dysrhythmia     2-3 yrs ago  . Diabetes mellitus     was at one time lost weight amd no problem with it now. About 2-3 yrs ago  .  Headache(784.0)     occas  . Arthritis     bil knees  . Angina     just started recently ,2-3 days ago-went to see Dr. Einar Gip 05/14/2011  . Palpitations   . Pacemaker 11/29/2012    dual chamber    Past Surgical History  Procedure Laterality Date  . Nasal sinus surgery      left side  . Cystectomy      under chin 1983  . Finger surgery      states he cut finger off when cutting wood  . Tympanoplasty  05/19/2011    Procedure: TYMPANOPLASTY;  Surgeon: Thornell Sartorius, MD;  Location: [place];  Service: ENT;  Laterality: Right;  Exploratory Tympanotomy  . Insert / replace / remove pacemaker  11/29/2012    [employer]     Family History  Problem Relation Age of Onset  . Heart attack Father     History   Social History  . Marital Status: Married    Spouse Name: N/A    Number of Children: N/A  . Years of Education: N/A   Occupational History  . Not on file.   Social History Main Topics  . Smoking status: Never Smoker   . Smokeless tobacco: Never Used  . Alcohol Use: No  . Drug Use: No  . Sexual  Activity: Not on file   Other Topics Concern  . Not on file   Social History Narrative  . No narrative on file    Review of systems: Problems achieving and maintaining erections The patient specifically denies any chest pain at rest or with exertion, dyspnea at rest or with exertion, orthopnea, paroxysmal nocturnal dyspnea, syncope, palpitations, focal neurological deficits, intermittent claudication, lower extremity edema, unexplained weight gain, cough, hemoptysis or wheezing.  The patient also denies abdominal pain, nausea, vomiting, dysphagia, diarrhea, constipation, polyuria, polydipsia, dysuria, hematuria, frequency, urgency, abnormal bleeding or bruising, fever, chills, unexpected weight changes, mood swings, change in skin or hair texture, change in voice quality, auditory or visual problems, allergic reactions or rashes, new musculoskeletal complaints other than  usual "aches and pains".   PHYSICAL EXAM BP 141/78  Pulse 71  Resp 16  Ht 5\' 11"  (1.803 m)  Wt 244 lb 8 oz (110.904 kg)  BMI 34.12 kg/m2  General: Alert, oriented x3, no distress Head: no evidence of trauma, PERRL, EOMI, no exophtalmos or lid lag, no myxedema, no xanthelasma; normal ears, nose and oropharynx Neck: normal jugular venous pulsations and no hepatojugular reflux; brisk carotid pulses without delay and no carotid bruits Chest: clear to auscultation, no signs of consolidation by percussion or palpation, normal fremitus, symmetrical and full respiratory excursions Cardiovascular: normal position and quality of the apical impulse, regular rhythm, normal first and second heart sounds, no murmurs, rubs or gallops Abdomen: no tenderness or distention, no masses by palpation, no abnormal pulsatility or arterial bruits, normal bowel sounds, no hepatosplenomegaly Extremities: no clubbing, cyanosis or edema; 2+ radial, ulnar and brachial pulses bilaterally; 2+ right femoral, posterior tibial and dorsalis pedis pulses; 2+ left femoral, posterior tibial and dorsalis pedis pulses; no subclavian or femoral bruits Neurological: grossly nonfocal   Lipid Panel  No results found for this basename: chol, trig, hdl, cholhdl, vldl, ldlcalc    BMET    Component Value Date/Time   NA 138 11/22/2012 1120   K 3.9 11/22/2012 1120   CL 101 11/22/2012 1120   CO2 28 11/22/2012 1120   GLUCOSE 98 11/22/2012 1120   BUN 11 11/22/2012 1120   CREATININE 0.96 11/22/2012 1120   CREATININE 0.95 05/15/2011 1130   CALCIUM 8.9 11/22/2012 1120   GFRNONAA 82* 05/15/2011 1130   GFRAA >90 05/15/2011 1130     ASSESSMENT AND PLAN Pacemaker Enroll in LAD acute remote pacemaker monitoring. May need to adjust rate response settings after we have 3- 6 months worth of data. For now seems to have a good response to treatmen.  NSVT (nonsustained ventricular tachycardia) Used the pacemaker to monitor for recurrence. No major  structural cardiac abnormalities by recent echo and stress testing. Likely to be low risk.  Erectile dysfunction He tells me he has taken erectile dysfunction medications in the past with good results and without side effects. Gave him a prescription for Viagra. Reminded him about the potential for deadly interaction with nitrates.   Patient Instructions  Remote monitoring is used to monitor your Pacemaker of ICD from home. This monitoring reduces the number of office visits required to check your device to one time per year. It allows [place] to keep an eye on the functioning of your device to ensure it is working properly. You are scheduled for a device check from home on April 11, 2013.  You may send your transmission at any time that day. If you have a wireless device, the transmission will be sent automatically. After  your physician reviews your transmission, you will receive a postcard with your next transmission date.  Your physician recommends that you schedule a follow-up appointment in: 6 months.  A prescription for Viagra 100mg  take as neededs.      Meds ordered this encounter  Medications  . Multiple Vitamin (MULTIVITAMIN) tablet    Sig: Take 1 tablet by mouth daily.  . sildenafil (VIAGRA) 100 MG tablet    Sig: Take 1 tablet (100 mg total) by mouth daily as needed for erectile dysfunction.    Dispense:  10 tablet    Refill:  [place] Dominga Mcduffie, MD, [facility] [phone] office [phone] pager

## 2013-01-12 LAB — MDC_IDC_ENUM_SESS_TYPE_INCLINIC
Battery Remaining Longevity: 12.5
Brady Statistic RA Percent Paced: 19 %
Brady Statistic RV Percent Paced: 100 %
Implantable Pulse Generator Serial Number: 112906
Lead Channel Impedance Value: 662 Ohm
Lead Channel Pacing Threshold Amplitude: 0.7 V
Lead Channel Pacing Threshold Pulse Width: 0.5 ms
Lead Channel Sensing Intrinsic Amplitude: 16.8 mV
Lead Channel Sensing Intrinsic Amplitude: 4.7 mV
Lead Channel Setting Pacing Amplitude: 3.5 V
Lead Channel Setting Pacing Pulse Width: 0.5 ms

## 2013-02-22 ENCOUNTER — Other Ambulatory Visit: Payer: Self-pay | Admitting: Internal Medicine

## 2013-02-22 DIAGNOSIS — R319 Hematuria, unspecified: Secondary | ICD-10-CM

## 2013-02-25 ENCOUNTER — Ambulatory Visit
Admission: RE | Admit: 2013-02-25 | Discharge: 2013-02-25 | Disposition: A | Discharge status: Home / Self Care | Payer: Medicare Other | Source: Ambulatory Visit | Attending: Internal Medicine | Admitting: Internal Medicine

## 2013-02-25 DIAGNOSIS — R319 Hematuria, unspecified: Secondary | ICD-10-CM

## 2013-03-09 ENCOUNTER — Telehealth: Payer: Self-pay | Admitting: Cardiovascular Disease

## 2013-03-09 NOTE — Telephone Encounter (Signed)
SPOKE TO PATIENT. HE STATES HE DID DO A TRANSMISSION. HE WANTED TO KNOW IF ANYTHING SHOWED UP.   INFORMED PATIENT THAT HIS QUESTION WILL FORWARDED TO  [place] . SHE WILL CONTACT HIM IN A COUPLE A DAYS.  VERBALIZED UNDERSTANDING

## 2013-03-09 NOTE — Telephone Encounter (Signed)
Feel a little uncomfortable off and on. He wanted to know if anything  Was showing on up hi monitor.

## 2013-03-10 NOTE — Telephone Encounter (Signed)
Patient states that he did not send a transmission, but wanted to know if anything urgent came through. I informed the patient that I had not received any urgent reports. I advised him to manually send a transmission if he was concerned. Patient voiced understanding.

## 2013-03-21 ENCOUNTER — Ambulatory Visit (INDEPENDENT_AMBULATORY_CARE_PROVIDER_SITE_OTHER): Payer: Medicare PPO | Admitting: Cardiology

## 2013-03-21 ENCOUNTER — Encounter: Payer: Self-pay | Admitting: Cardiology

## 2013-03-21 ENCOUNTER — Ambulatory Visit (HOSPITAL_COMMUNITY)
Admission: RE | Admit: 2013-03-21 | Discharge: 2013-03-21 | Disposition: A | Discharge status: Home / Self Care | Payer: Medicare PPO | Source: Ambulatory Visit | Attending: Internal Medicine | Admitting: Internal Medicine

## 2013-03-21 ENCOUNTER — Ambulatory Visit (INDEPENDENT_AMBULATORY_CARE_PROVIDER_SITE_OTHER): Payer: Medicare PPO | Admitting: *Deleted

## 2013-03-21 VITALS — BP 130/84 | HR 86 | Ht 71.0 in | Wt 245.0 lb

## 2013-03-21 DIAGNOSIS — R079 Chest pain, unspecified: Secondary | ICD-10-CM | POA: Insufficient documentation

## 2013-03-21 DIAGNOSIS — I498 Other specified cardiac arrhythmias: Secondary | ICD-10-CM

## 2013-03-21 DIAGNOSIS — I471 Supraventricular tachycardia: Secondary | ICD-10-CM

## 2013-03-21 DIAGNOSIS — I4729 Other ventricular tachycardia: Secondary | ICD-10-CM

## 2013-03-21 DIAGNOSIS — I359 Nonrheumatic aortic valve disorder, unspecified: Secondary | ICD-10-CM | POA: Insufficient documentation

## 2013-03-21 DIAGNOSIS — Z950 Presence of cardiac pacemaker: Secondary | ICD-10-CM

## 2013-03-21 DIAGNOSIS — Z45018 Encounter for adjustment and management of other part of cardiac pacemaker: Secondary | ICD-10-CM | POA: Insufficient documentation

## 2013-03-21 DIAGNOSIS — I441 Atrioventricular block, second degree: Secondary | ICD-10-CM

## 2013-03-21 DIAGNOSIS — F439 Reaction to severe stress, unspecified: Secondary | ICD-10-CM

## 2013-03-21 DIAGNOSIS — E119 Type 2 diabetes mellitus without complications: Secondary | ICD-10-CM | POA: Insufficient documentation

## 2013-03-21 DIAGNOSIS — I472 Ventricular tachycardia: Secondary | ICD-10-CM

## 2013-03-21 DIAGNOSIS — F430 Acute stress reaction: Secondary | ICD-10-CM

## 2013-03-21 NOTE — Patient Instructions (Signed)
Call if you continue with the symptoms.  Send in a pacer check if the symptoms re-occur.  Have CXR done.  We will call you results of the echo tomorrow.    Follow up with Dr. Sallyanne Kuster in 1 month, unless continued problems.

## 2013-03-21 NOTE — Progress Notes (Signed)
2D Echo Performed 03/21/2013    Diron Haddon, RCS  

## 2013-03-21 NOTE — Progress Notes (Signed)
03/21/2013   PCP: Merrilee Seashore, MD   Chief Complaint  Patient presents with  . Follow-up    CP x 2-3 days    Primary Cardiologist: Dr Gwenlyn Found Pacer Dr. Sallyanne Kuster  HPI:  73 year old AAM presents for an occ chest pain at rest, he is concerned it is his pacemaker, he has been pulling on his 61 year old father- in law.  Pain began 3-4 days ago under Lt breast around the area of the diaphragm and goes to the rt at times.  Increases if  He lies on his lt side.  No SOB or nausea.  No recent colds or fevers.  Episodes do not last very long.    In Sept pt had normal nuc study and echo with EF 55-60%.  He had implantation of a dual-chamber permanent pacemaker for symptomatic bradycardia secondary to second degree AV block Mobitz type I. He received a [employer] advantio [id number] dual-chamber device in October 2014.     No Known Allergies  Current Outpatient Prescriptions  Medication Sig Dispense Refill  . acetaminophen (TYLENOL) 325 MG tablet Take 1-2 tablets (325-650 mg total) by mouth every 4 (four) hours as needed.      Marland Kitchen amLODipine-benazepril (LOTREL) 5-20 MG per capsule Take 1 capsule by mouth daily.       . metFORMIN (GLUCOPHAGE-XR) 750 MG 24 hr tablet Take 750 mg by mouth 2 (two) times daily.       . Multiple Vitamin (MULTIVITAMIN) tablet Take 1 tablet by mouth daily.      . pravastatin (PRAVACHOL) 40 MG tablet Take 40 mg by mouth daily.       . sildenafil (VIAGRA) 100 MG tablet Take 1 tablet (100 mg total) by mouth daily as needed for erectile dysfunction.  10 tablet  1  . Tamsulosin HCl (FLOMAX) 0.4 MG CAPS Take 0.4 mg by mouth daily after supper.       No current facility-administered medications for this visit.    Past Medical History  Diagnosis Date  . Mastoiditis     right ear  . Hypertension   . Dysrhythmia     2-3 yrs ago  . Diabetes mellitus     was at one time lost weight amd no problem with it now. About 2-3 yrs ago  . Headache(784.0)    occas  . Arthritis     bil knees  . Angina     just started recently ,2-3 days ago-went to see Dr. Einar Gip 05/14/2011  . Palpitations   . Pacemaker 11/29/2012    dual chamber    Past Surgical History  Procedure Laterality Date  . Nasal sinus surgery      left side  . Cystectomy      under chin 1983  . Finger surgery      states he cut finger off when cutting wood  . Tympanoplasty  05/19/2011    Procedure: TYMPANOPLASTY;  Surgeon: Thornell Sartorius, MD;  Location: [place];  Service: ENT;  Laterality: Right;  Exploratory Tympanotomy  . Insert / replace / remove pacemaker  11/29/2012    [employer]     [id number] colds or fevers, no weight changes Skin:no rashes or ulcers HEENT:no blurred vision, no congestion CV:see HPI PUL:see HPI GI:no diarrhea constipation or melena, no indigestion GU:no hematuria, no dysuria MS:no joint pain, no claudication Neuro:no syncope, no lightheadedness Endo:no diabetes, no thyroid disease  PHYSICAL EXAM BP 130/84  Pulse 86  Ht 5\' 11"  (1.803 m)  Wt 245 lb (111.131 kg)  BMI 34.19 kg/m2 General:Pleasant affect, NAD Skin:Warm and dry, brisk capillary refill HEENT:normocephalic, sclera clear, mucus membranes moist Neck:supple, no JVD, no bruits  Heart:S1S2 RRR without murmur, gallup, rub or click, cannot re-create pain with palpation Lungs:clear without rales, rhonchi, or wheezes [id number], non tender, + BS, do not palpate liver spleen or masses Ext:no lower ext edema, 2+ pedal pulses, 2+ radial pulses Neuro:alert and oriented, MAE, follows commands, + facial symmetry EKG: Atrial sensing and V pacing.  ASSESSMENT AND PLAN Chest pain Recent episode of chest pain, lt diaphragmatic pain, does radiate to rt at times.  Worse with lying on Lt side.  Does not last long, he was worried because this was new and he had been pulling up his father in law who is 95.   On integration of pacer he has retrograde conduction and his symptoms seem to occur  then.  His Pvarb was adjusted.  This seemed to improve his symptoms.  Will discuss with Dr. Sallyanne Kuster tomorrow.  Pt will send rhythm if this occurs again.  He will also call us.  I also ordered 2 D echo limited study to eval for effusion.  CXR pending also.  He previously has had neg. nuc study in Sept 2014.     AV junctional tachycardia Some retrograde conduction on interrogation.  Pacemaker [place] [id number] DR EL serial number [id number]  Placed 11/29/2012 - Dr. Sallyanne Kuster  See above note.

## 2013-03-21 NOTE — Assessment & Plan Note (Addendum)
Recent episode of chest pain, lt diaphragmatic pain, does radiate to rt at times.  Worse with lying on Lt side.  Does not last long, he was worried because this was new and he had been pulling up his father in law who is 95.   On integration of pacer he has retrograde conduction and his symptoms seem to occur then.  His Pvarb was adjusted.  This seemed to improve his symptoms.  Will discuss with Dr. Sallyanne Kuster tomorrow.  Pt will send rhythm if this occurs again.  He will also call us.  I also ordered 2 D echo limited study to eval for effusion.  CXR pending also.  He previously has had neg. nuc study in Sept 2014.

## 2013-03-21 NOTE — Assessment & Plan Note (Signed)
Generator [employer] Advantio model [id number] DR EL serial number [id number]  Placed 11/29/2012 - Dr. Sallyanne Kuster  See above note.

## 2013-03-21 NOTE — Assessment & Plan Note (Signed)
Some retrograde conduction on interrogation.

## 2013-03-22 ENCOUNTER — Encounter: Payer: Self-pay | Admitting: Cardiology

## 2013-03-22 ENCOUNTER — Ambulatory Visit
Admission: RE | Admit: 2013-03-22 | Discharge: 2013-03-22 | Disposition: A | Discharge status: Home / Self Care | Payer: Medicare PPO | Source: Ambulatory Visit | Attending: Cardiology | Admitting: Cardiology

## 2013-03-22 ENCOUNTER — Other Ambulatory Visit: Payer: Self-pay | Admitting: *Deleted

## 2013-03-22 DIAGNOSIS — Z950 Presence of cardiac pacemaker: Secondary | ICD-10-CM

## 2013-03-22 DIAGNOSIS — R079 Chest pain, unspecified: Secondary | ICD-10-CM

## 2013-03-23 ENCOUNTER — Other Ambulatory Visit: Payer: Self-pay | Admitting: Internal Medicine

## 2013-03-23 DIAGNOSIS — N281 Cyst of kidney, acquired: Secondary | ICD-10-CM

## 2013-03-25 ENCOUNTER — Ambulatory Visit
Admission: RE | Admit: 2013-03-25 | Discharge: 2013-03-25 | Disposition: A | Discharge status: Home / Self Care | Payer: Medicare PPO | Source: Ambulatory Visit | Attending: Internal Medicine | Admitting: Internal Medicine

## 2013-03-25 DIAGNOSIS — N281 Cyst of kidney, acquired: Secondary | ICD-10-CM

## 2013-03-25 MED ORDER — IOHEXOL 350 MG/ML SOLN
100.0000 mL | Freq: Once | INTRAVENOUS | Status: AC | PRN
Start: 2013-03-25 — End: 2013-03-25
  Administered 2013-03-25: 100 mL via INTRAVENOUS

## 2013-04-08 LAB — MDC_IDC_ENUM_SESS_TYPE_INCLINIC
Battery Remaining Longevity: 12.5
Implantable Pulse Generator Serial Number: 112906
Lead Channel Impedance Value: 675 Ohm
Lead Channel Pacing Threshold Amplitude: 0.7 V
Lead Channel Pacing Threshold Pulse Width: 0.5 ms
Lead Channel Sensing Intrinsic Amplitude: 20.2 mV
Lead Channel Setting Pacing Amplitude: 2 V
Lead Channel Setting Pacing Pulse Width: 0.5 ms
Lead Channel Setting Sensing Sensitivity: 2.5 mV
MDC IDC MSMT LEADCHNL RA IMPEDANCE VALUE: 702 Ohm
MDC IDC MSMT LEADCHNL RA PACING THRESHOLD AMPLITUDE: 0.7 V
MDC IDC MSMT LEADCHNL RA PACING THRESHOLD PULSEWIDTH: 0.5 ms
MDC IDC MSMT LEADCHNL RA SENSING INTR AMPL: 5.3 mV
MDC IDC SET LEADCHNL RV PACING AMPLITUDE: 2.5 V
MDC IDC STAT BRADY RA PERCENT PACED: 14 %
MDC IDC STAT BRADY RV PERCENT PACED: 99 %

## 2013-04-08 NOTE — Progress Notes (Signed)
Pacemaker check in clinic with Cecilie Kicks, NP (industry checked). Normal device function. Thresholds, sensing, impedances consistent with previous measurements. Device programmed to maximize longevity. No mode switch or high ventricular rates noted. Device programmed at appropriate safety margins. Histogram distribution appropriate for patient activity level. Device programmed to optimize intrinsic conduction. Estimated longevity 12.5 years. Patient will follow up with [facility] in 1 month.

## 2013-04-11 ENCOUNTER — Encounter: Payer: Medicare Other | Admitting: *Deleted

## 2013-04-18 ENCOUNTER — Ambulatory Visit: Payer: Self-pay | Admitting: Podiatry

## 2013-04-27 ENCOUNTER — Encounter: Payer: Self-pay | Admitting: Cardiovascular Disease

## 2013-04-27 ENCOUNTER — Ambulatory Visit (INDEPENDENT_AMBULATORY_CARE_PROVIDER_SITE_OTHER): Payer: Medicare PPO | Admitting: Cardiovascular Disease

## 2013-04-27 VITALS — BP 150/84 | HR 84 | Resp 16 | Ht 71.0 in | Wt 244.5 lb

## 2013-04-27 DIAGNOSIS — I4729 Other ventricular tachycardia: Secondary | ICD-10-CM

## 2013-04-27 DIAGNOSIS — I472 Ventricular tachycardia: Secondary | ICD-10-CM

## 2013-04-27 DIAGNOSIS — Z950 Presence of cardiac pacemaker: Secondary | ICD-10-CM

## 2013-04-27 DIAGNOSIS — I441 Atrioventricular block, second degree: Secondary | ICD-10-CM

## 2013-04-27 LAB — MDC_IDC_ENUM_SESS_TYPE_INCLINIC
Battery Remaining Longevity: 12
Brady Statistic RV Percent Paced: 99 %
Lead Channel Impedance Value: 660 Ohm
Lead Channel Impedance Value: 675 Ohm
Lead Channel Pacing Threshold Amplitude: 0.7 V
Lead Channel Pacing Threshold Pulse Width: 0.5 ms
Lead Channel Sensing Intrinsic Amplitude: 23.6 mV
Lead Channel Sensing Intrinsic Amplitude: 5.9 mV
Lead Channel Setting Sensing Sensitivity: 2.5 mV
MDC IDC MSMT LEADCHNL RV PACING THRESHOLD AMPLITUDE: 0.6 V
MDC IDC MSMT LEADCHNL RV PACING THRESHOLD PULSEWIDTH: 0.5 ms
MDC IDC PG SERIAL: 112906
MDC IDC SESS DTM: 20150225050000
MDC IDC SET LEADCHNL RA PACING AMPLITUDE: 2 V
MDC IDC SET LEADCHNL RV PACING AMPLITUDE: 2.5 V
MDC IDC SET LEADCHNL RV PACING PULSEWIDTH: 0.5 ms
MDC IDC SET ZONE DETECTION INTERVAL: 375 ms
MDC IDC STAT BRADY RA PERCENT PACED: 8 %

## 2013-04-27 LAB — PACEMAKER DEVICE OBSERVATION

## 2013-04-27 NOTE — Patient Instructions (Signed)
Remote monitoring is used to monitor your pacemaker from home. This monitoring reduces the number of office visits required to check your device to one time per year. It allows [place] to keep an eye on the functioning of your device to ensure it is working properly. You are scheduled for a device check from home on 08-01-2013. You may send your transmission at any time that day. If you have a wireless device, the transmission will be sent automatically. After your physician reviews your transmission, you will receive a postcard with your next transmission date.  Your physician recommends that you schedule a follow-up appointment in: 12 months with Dr.Croitoru

## 2013-04-29 ENCOUNTER — Encounter: Payer: Self-pay | Admitting: Cardiovascular Disease

## 2013-04-29 NOTE — Assessment & Plan Note (Signed)
Normal device function. No permanent reprogramming changes were made. Virtually 100% ventricular pacing, secondary to heart block. Excellent heart rate histogram distribution.

## 2013-04-29 NOTE — Progress Notes (Signed)
Patient ID: Ricky Barnett, male   DOB: [birth year], 73 y.o.   MRN: [id number]     Reason for office visit Pacemaker followup  Generally Ricky Barnett is doing well. He complains of occasional split second piercing "flash"-like discomfort through his heart area. This is not associated with breathing or physical activity. His chest is nontender to touch. He has rare episodes of lightheadedness and occasional shortness of breath.   His biggest problem is emotional distress. He recently lost a child to heart disease. There have also been other family illnesses and deaths. He continues to care for his 65 year old father in law.  He has a structurally normal heart and received a dual-chamber [employer] pacemaker for second degree Mobitz type I AV block with symptomatic bradycardia in October of 2014. The device is functioning normally on a comprehensive check performed in the office today. He has recorded 2 episodes of nonsustained ventricular tachycardia. One lasted for only 3 beats. The other was a longer event consisting of 10 beats with a cycle length around 400 ms and a "warm up" pattern. It occurred on January 19 and was asymptomatic.   No Known Allergies  Current Outpatient Prescriptions  Medication Sig Dispense Refill  . acetaminophen (TYLENOL) 325 MG tablet Take 1-2 tablets (325-650 mg total) by mouth every 4 (four) hours as needed.      Marland Kitchen amLODipine-benazepril (LOTREL) 5-20 MG per capsule Take 1 capsule by mouth daily.       . metFORMIN (GLUCOPHAGE-XR) 750 MG 24 hr tablet Take 750 mg by mouth 2 (two) times daily.       . Multiple Vitamin (MULTIVITAMIN) tablet Take 1 tablet by mouth daily.      . pravastatin (PRAVACHOL) 40 MG tablet Take 40 mg by mouth daily.       . Tamsulosin HCl (FLOMAX) 0.4 MG CAPS Take 0.4 mg by mouth daily after supper.       No current facility-administered medications for this visit.    Past Medical History  Diagnosis Date  . Mastoiditis     right ear  .  Hypertension   . Dysrhythmia     2-3 yrs ago  . Diabetes mellitus     was at one time lost weight amd no problem with it now. About 2-3 yrs ago  . Headache(784.0)     occas  . Arthritis     bil knees  . Angina     just started recently ,2-3 days ago-went to see Dr. Einar Gip 05/14/2011  . Palpitations   . Pacemaker 11/29/2012    dual chamber    Past Surgical History  Procedure Laterality Date  . Nasal sinus surgery      left side  . Cystectomy      under chin 1983  . Finger surgery      states he cut finger off when cutting wood  . Tympanoplasty  05/19/2011    Procedure: TYMPANOPLASTY;  Surgeon: Thornell Sartorius, MD;  Location: [place];  Service: ENT;  Laterality: Right;  Exploratory Tympanotomy  . Insert / replace / remove pacemaker  11/29/2012    [employer]     Family History  Problem Relation Age of Onset  . Heart attack Father     History   Social History  . Marital Status: Married    Spouse Name: N/A    Number of Children: N/A  . Years of Education: N/A   Occupational History  . Not on file.   Social  History Main Topics  . Smoking status: Never Smoker   . Smokeless tobacco: Never Used  . Alcohol Use: No  . Drug Use: No  . Sexual Activity: Not on file   Other Topics Concern  . Not on file   Social History Narrative  . No narrative on file    Review of systems: The patient specifically denies any chest pain at rest or with exertion, dyspnea at rest or with exertion, orthopnea, paroxysmal nocturnal dyspnea, syncope, palpitations, focal neurological deficits, intermittent claudication, lower extremity edema, unexplained weight gain, cough, hemoptysis or wheezing.  The patient also denies abdominal pain, nausea, vomiting, dysphagia, diarrhea, constipation, polyuria, polydipsia, dysuria, hematuria, frequency, urgency, abnormal bleeding or bruising, fever, chills, unexpected weight changes, mood swings, change in skin or hair texture, change in voice  quality, auditory or visual problems, allergic reactions or rashes, new musculoskeletal complaints other than usual "aches and pains".   PHYSICAL EXAM BP 150/84  Pulse 84  Resp 16  Ht 5\' 11"  (1.803 m)  Wt 110.904 kg (244 lb 8 oz)  BMI 34.12 kg/m2 Recheck blood pressure 137/81 General: Alert, oriented x3, no distress Head: no evidence of trauma, PERRL, EOMI, no exophtalmos or lid lag, no myxedema, no xanthelasma; normal ears, nose and oropharynx Neck: normal jugular venous pulsations and no hepatojugular reflux; brisk carotid pulses without delay and no carotid bruits Chest: clear to auscultation, no signs of consolidation by percussion or palpation, normal fremitus, symmetrical and full respiratory excursions Cardiovascular: normal position and quality of the apical impulse, regular rhythm, normal first and second heart sounds, no murmurs, rubs or gallops Abdomen: no tenderness or distention, no masses by palpation, no abnormal pulsatility or arterial bruits, normal bowel sounds, no hepatosplenomegaly Extremities: no clubbing, cyanosis or edema; 2+ radial, ulnar and brachial pulses bilaterally; 2+ right femoral, posterior tibial and dorsalis pedis pulses; 2+ left femoral, posterior tibial and dorsalis pedis pulses; no subclavian or femoral bruits Neurological: grossly nonfocal   EKG: Atrial sense ventricular pace  Lipid Panel  No results found for this basename: chol, trig, hdl, cholhdl, vldl, ldlcalc    BMET    Component Value Date/Time   NA 138 11/22/2012 1120   K 3.9 11/22/2012 1120   CL 101 11/22/2012 1120   CO2 28 11/22/2012 1120   GLUCOSE 98 11/22/2012 1120   BUN 11 11/22/2012 1120   CREATININE 0.96 11/22/2012 1120   CREATININE 0.95 05/15/2011 1130   CALCIUM 8.9 11/22/2012 1120   GFRNONAA 82* 05/15/2011 1130   GFRAA >90 05/15/2011 1130     ASSESSMENT AND PLAN Pacemaker Normal device function. No permanent reprogramming changes were made. Virtually 100% ventricular pacing,  secondary to heart block. Excellent heart rate histogram distribution.   NSVT (nonsustained ventricular tachycardia) In the absence of major structural cardiac abnormalities by previous echo and stress testing this is unlikely to be clinically relevant. Brief episodes of nonsustained VT happen recorded by his pacemaker ever since implantation. No therapy appears necessary at this time. Will consider switching his antihypertensive to a beta blocker if the burden increases.   Patient Instructions  Remote monitoring is used to monitor your pacemaker from home. This monitoring reduces the number of office visits required to check your device to one time per year. It allows [place] to keep an eye on the functioning of your device to ensure it is working properly. You are scheduled for a device check from home on 08-01-2013. You may send your transmission at any time that day. If you  have a wireless device, the transmission will be sent automatically. After your physician reviews your transmission, you will receive a postcard with your next transmission date.  Your physician recommends that you schedule a follow-up appointment in: 12 months with Dr.Rylin Seavey        Orders Placed This Encounter  Procedures  . Implantable device check   No orders of the defined types were placed in this encounter.    Holli Humbles, MD, [place] [phone] office [phone] pager

## 2013-04-29 NOTE — Assessment & Plan Note (Signed)
In the absence of major structural cardiac abnormalities by previous echo and stress testing this is unlikely to be clinically relevant. Brief episodes of nonsustained VT happen recorded by his pacemaker ever since implantation. No therapy appears necessary at this time. Will consider switching his antihypertensive to a beta blocker if the burden increases.

## 2013-07-28 ENCOUNTER — Encounter: Payer: Self-pay | Admitting: Cardiovascular Disease

## 2013-08-01 ENCOUNTER — Ambulatory Visit (INDEPENDENT_AMBULATORY_CARE_PROVIDER_SITE_OTHER): Payer: Medicare PPO | Admitting: *Deleted

## 2013-08-01 ENCOUNTER — Telehealth: Payer: Self-pay | Admitting: Cardiology

## 2013-08-01 ENCOUNTER — Encounter: Payer: Self-pay | Admitting: Cardiovascular Disease

## 2013-08-01 DIAGNOSIS — I441 Atrioventricular block, second degree: Secondary | ICD-10-CM

## 2013-08-01 LAB — MDC_IDC_ENUM_SESS_TYPE_REMOTE
Brady Statistic RA Percent Paced: 14 %
Brady Statistic RV Percent Paced: 99 %
Lead Channel Impedance Value: 631 Ohm
Lead Channel Impedance Value: 670 Ohm
Lead Channel Sensing Intrinsic Amplitude: 5.5 mV
Lead Channel Setting Pacing Amplitude: 2 V
Lead Channel Setting Pacing Amplitude: 2.5 V
Lead Channel Setting Pacing Pulse Width: 0.5 ms
Lead Channel Setting Sensing Sensitivity: 2.5 mV
MDC IDC PG SERIAL: 112906
Zone Setting Detection Interval: 375 ms

## 2013-08-01 NOTE — Telephone Encounter (Signed)
Spoke with pt and reminded pt of remote transmission that is due today. Pt verbalized understanding.   

## 2013-08-01 NOTE — Progress Notes (Signed)
Remote pacemaker transmission.   

## 2013-08-10 ENCOUNTER — Encounter: Payer: Self-pay | Admitting: Cardiology

## 2013-10-10 ENCOUNTER — Other Ambulatory Visit: Payer: Self-pay | Admitting: Internal Medicine

## 2013-10-10 DIAGNOSIS — Q619 Cystic kidney disease, unspecified: Secondary | ICD-10-CM

## 2013-11-01 ENCOUNTER — Other Ambulatory Visit: Payer: Medicare PPO

## 2013-11-01 ENCOUNTER — Ambulatory Visit
Admission: RE | Admit: 2013-11-01 | Discharge: 2013-11-01 | Disposition: A | Discharge status: Home / Self Care | Payer: Medicare PPO | Source: Ambulatory Visit | Attending: Internal Medicine | Admitting: Internal Medicine

## 2013-11-01 DIAGNOSIS — Q619 Cystic kidney disease, unspecified: Secondary | ICD-10-CM

## 2013-11-01 MED ORDER — IOHEXOL 350 MG/ML SOLN
125.0000 mL | Freq: Once | INTRAVENOUS | Status: AC | PRN
  Administered 2013-11-01: 125 mL via INTRAVENOUS

## 2013-11-02 ENCOUNTER — Encounter: Payer: Self-pay | Admitting: Cardiovascular Disease

## 2013-11-02 ENCOUNTER — Ambulatory Visit (INDEPENDENT_AMBULATORY_CARE_PROVIDER_SITE_OTHER): Payer: Medicare PPO | Admitting: *Deleted

## 2013-11-02 DIAGNOSIS — I441 Atrioventricular block, second degree: Secondary | ICD-10-CM

## 2013-11-02 NOTE — Progress Notes (Signed)
Remote pacemaker transmission.   

## 2013-11-04 LAB — MDC_IDC_ENUM_SESS_TYPE_REMOTE
Lead Channel Impedance Value: 652 Ohm
Lead Channel Impedance Value: 687 Ohm
Lead Channel Sensing Intrinsic Amplitude: 6.5 mV
Lead Channel Setting Pacing Amplitude: 2 V
Lead Channel Setting Pacing Amplitude: 2.5 V
MDC IDC PG SERIAL: 112906
MDC IDC SET LEADCHNL RV PACING PULSEWIDTH: 0.5 ms
MDC IDC SET LEADCHNL RV SENSING SENSITIVITY: 2.5 mV
MDC IDC STAT BRADY RA PERCENT PACED: 14 %
MDC IDC STAT BRADY RV PERCENT PACED: 98 %
Zone Setting Detection Interval: 375 ms

## 2013-11-10 ENCOUNTER — Encounter: Payer: Self-pay | Admitting: Cardiology

## 2014-02-09 ENCOUNTER — Encounter (HOSPITAL_COMMUNITY): Payer: Self-pay | Admitting: Cardiovascular Disease

## 2014-04-03 DIAGNOSIS — E782 Mixed hyperlipidemia: Secondary | ICD-10-CM | POA: Diagnosis not present

## 2014-04-03 DIAGNOSIS — E119 Type 2 diabetes mellitus without complications: Secondary | ICD-10-CM | POA: Diagnosis not present

## 2014-04-03 DIAGNOSIS — I10 Essential (primary) hypertension: Secondary | ICD-10-CM | POA: Diagnosis not present

## 2014-04-19 DIAGNOSIS — E782 Mixed hyperlipidemia: Secondary | ICD-10-CM | POA: Diagnosis not present

## 2014-04-19 DIAGNOSIS — I10 Essential (primary) hypertension: Secondary | ICD-10-CM | POA: Diagnosis not present

## 2014-04-19 DIAGNOSIS — E119 Type 2 diabetes mellitus without complications: Secondary | ICD-10-CM | POA: Diagnosis not present

## 2014-05-11 ENCOUNTER — Telehealth: Payer: Self-pay | Admitting: Cardiovascular Disease

## 2014-05-11 ENCOUNTER — Ambulatory Visit (INDEPENDENT_AMBULATORY_CARE_PROVIDER_SITE_OTHER): Payer: Commercial Managed Care - HMO | Admitting: *Deleted

## 2014-05-11 ENCOUNTER — Encounter: Payer: Self-pay | Admitting: Cardiovascular Disease

## 2014-05-11 DIAGNOSIS — I441 Atrioventricular block, second degree: Secondary | ICD-10-CM

## 2014-05-11 NOTE — Telephone Encounter (Signed)
Verbal instructions given. Model/SN for ppm was also given. I asked pt to send in a manual remote transmission bc he is past due. Patient voiced understanding.

## 2014-05-11 NOTE — Telephone Encounter (Signed)
Pt moving back to [place]. He wants to know what to do about his pacemaker box.

## 2014-05-12 NOTE — Progress Notes (Signed)
Remote pacemaker transmission.   

## 2014-05-29 LAB — MDC_IDC_ENUM_SESS_TYPE_REMOTE
Battery Remaining Longevity: 11.5
Brady Statistic RA Percent Paced: 16 %
Brady Statistic RV Percent Paced: 96 %
Lead Channel Impedance Value: 663 Ohm
Lead Channel Sensing Intrinsic Amplitude: 3.3 mV
Lead Channel Setting Pacing Amplitude: 2.5 V
Lead Channel Setting Sensing Sensitivity: 2.5 mV
MDC IDC MSMT LEADCHNL RV IMPEDANCE VALUE: 636 Ohm
MDC IDC MSMT LEADCHNL RV SENSING INTR AMPL: 20.8 mV
MDC IDC PG SERIAL: 112906
MDC IDC SET LEADCHNL RA PACING AMPLITUDE: 2 V
MDC IDC SET LEADCHNL RV PACING PULSEWIDTH: 0.5 ms
MDC IDC SET ZONE DETECTION INTERVAL: 375 ms

## 2014-06-01 ENCOUNTER — Encounter: Payer: Self-pay | Admitting: Cardiology

## 2014-06-12 DIAGNOSIS — J060 Acute laryngopharyngitis: Secondary | ICD-10-CM | POA: Diagnosis not present

## 2014-06-12 DIAGNOSIS — I10 Essential (primary) hypertension: Secondary | ICD-10-CM | POA: Diagnosis not present

## 2014-06-12 DIAGNOSIS — E782 Mixed hyperlipidemia: Secondary | ICD-10-CM | POA: Diagnosis not present

## 2014-06-12 DIAGNOSIS — E119 Type 2 diabetes mellitus without complications: Secondary | ICD-10-CM | POA: Diagnosis not present

## 2014-07-17 DIAGNOSIS — H18413 Arcus senilis, bilateral: Secondary | ICD-10-CM | POA: Diagnosis not present

## 2014-07-17 DIAGNOSIS — H2513 Age-related nuclear cataract, bilateral: Secondary | ICD-10-CM | POA: Diagnosis not present

## 2014-07-17 DIAGNOSIS — H11153 Pinguecula, bilateral: Secondary | ICD-10-CM | POA: Diagnosis not present

## 2014-07-17 DIAGNOSIS — H40023 Open angle with borderline findings, high risk, bilateral: Secondary | ICD-10-CM | POA: Diagnosis not present

## 2014-07-17 DIAGNOSIS — H25013 Cortical age-related cataract, bilateral: Secondary | ICD-10-CM | POA: Diagnosis not present

## 2014-07-17 DIAGNOSIS — H20021 Recurrent acute iridocyclitis, right eye: Secondary | ICD-10-CM | POA: Diagnosis not present

## 2014-08-17 DIAGNOSIS — R0602 Shortness of breath: Secondary | ICD-10-CM | POA: Diagnosis not present

## 2014-08-17 DIAGNOSIS — I10 Essential (primary) hypertension: Secondary | ICD-10-CM | POA: Diagnosis not present

## 2014-08-17 DIAGNOSIS — E782 Mixed hyperlipidemia: Secondary | ICD-10-CM | POA: Diagnosis not present

## 2014-08-17 DIAGNOSIS — E119 Type 2 diabetes mellitus without complications: Secondary | ICD-10-CM | POA: Diagnosis not present

## 2014-08-28 DIAGNOSIS — R0602 Shortness of breath: Secondary | ICD-10-CM | POA: Diagnosis not present

## 2014-09-12 ENCOUNTER — Telehealth: Payer: Self-pay | Admitting: Cardiovascular Disease

## 2014-09-12 NOTE — Telephone Encounter (Signed)
Reviewed pacemaker transmission results with pt. Answered all questions.

## 2014-09-12 NOTE — Telephone Encounter (Signed)
Tired, sluggish, sob, and some chest pressure on and off.  Thinks his pacemaker may not be working right.

## 2014-09-12 NOTE — Telephone Encounter (Signed)
Instructed pt to send manual transmission and someone will call back w/ results. Pt verbalized understanding.

## 2014-09-12 NOTE — Telephone Encounter (Signed)
Pt is returning Nathans call  ° °Thanks  °

## 2014-09-12 NOTE — Telephone Encounter (Signed)
Pt called again,says he is still waiting to hear from somebody.

## 2014-09-12 NOTE — Telephone Encounter (Signed)
Pt of Dr. Sallyanne Kuster. PM dependent for ventricular pacing w/ mobitz block type 1. Overdue for OV. Last seen 04/2013 by Dr. Sallyanne Kuster w/ recommendation for yearly f/u & routine remote downloads.  C/o sluggishness, slightly SOB since weekend. He denies CP at present.  He is noting that pacemaker may not be working right - did not outline any specific concerns regarding this.  Call ended abruptly - pt may have lost connection on line. Attempted to call back, wife picked up phone but evidently did not hear/receiver not audible. She hung up.  Called back 3rd time, got VM - left pt instruction to attempt pacemaker download. Will fwd to device clinic, Dr. [place] ([facility]) for further advice.

## 2014-09-25 ENCOUNTER — Telehealth: Payer: Self-pay | Admitting: Cardiovascular Disease

## 2014-09-25 NOTE — Telephone Encounter (Signed)
New Message  Pt requested to speak w/ Device about remote transmission. Can also use [phone]. Please call back and discuss.

## 2014-09-25 NOTE — Telephone Encounter (Signed)
Patient not scheduled for remote transmission but he is overdue to see [facility]. Will have Melissa call to schedule first available.

## 2014-10-13 ENCOUNTER — Encounter: Payer: Self-pay | Admitting: Cardiovascular Disease

## 2014-10-13 ENCOUNTER — Ambulatory Visit (INDEPENDENT_AMBULATORY_CARE_PROVIDER_SITE_OTHER): Payer: Commercial Managed Care - HMO | Admitting: Cardiovascular Disease

## 2014-10-13 VITALS — BP 136/82 | HR 74 | Resp 20 | Ht 71.0 in | Wt 242.0 lb

## 2014-10-13 DIAGNOSIS — I4729 Other ventricular tachycardia: Secondary | ICD-10-CM

## 2014-10-13 DIAGNOSIS — I428 Other cardiomyopathies: Secondary | ICD-10-CM

## 2014-10-13 DIAGNOSIS — I441 Atrioventricular block, second degree: Secondary | ICD-10-CM

## 2014-10-13 DIAGNOSIS — Z950 Presence of cardiac pacemaker: Secondary | ICD-10-CM

## 2014-10-13 DIAGNOSIS — I471 Supraventricular tachycardia: Secondary | ICD-10-CM | POA: Diagnosis not present

## 2014-10-13 DIAGNOSIS — I10 Essential (primary) hypertension: Secondary | ICD-10-CM

## 2014-10-13 DIAGNOSIS — I472 Ventricular tachycardia: Secondary | ICD-10-CM

## 2014-10-13 DIAGNOSIS — I429 Cardiomyopathy, unspecified: Secondary | ICD-10-CM

## 2014-10-13 DIAGNOSIS — R0600 Dyspnea, unspecified: Secondary | ICD-10-CM | POA: Diagnosis not present

## 2014-10-13 NOTE — Patient Instructions (Addendum)
Your physician has requested that you have an echocardiogram @ [address]. Echocardiography is a painless test that uses sound waves to create images of your heart. It provides your doctor with information about the size and shape of your heart and how well your heart's chambers and valves are working. This procedure takes approximately one hour. There are no restrictions for this procedure.  Remote monitoring is used to monitor your Pacemaker from home. This monitoring reduces the number of office visits required to check your device to one time per year. It allows [place] to keep an eye on the functioning of your device to ensure it is working properly. You are scheduled for a device check from home on 01/15/2015. You may send your transmission at any time that day. If you have a wireless device, the transmission will be sent automatically. After your physician reviews your transmission, you will receive a postcard with your next transmission date.  Your physician wants you to follow-up in: 1 year with Dr. Sallyanne Kuster. You will receive a reminder letter in the mail two months in advance. If you don't receive a letter, please call our office to schedule the follow-up appointment.

## 2014-10-13 NOTE — Progress Notes (Signed)
Patient ID: Ricky Barnett, male   DOB: October 07, 1940, 74 y.o.   MRN: [id number]      Cardiology Office Note   Date:  10/14/2014   ID:  Ricky Barnett, DOB Feb 25, 1941, MRN [id number]  PCP:  Merrilee Seashore, MD  Cardiologist:   Sanda Klein, MD   Chief Complaint  Patient presents with  . OVER DUE VISIT    Patient light headed, dizzy, SOB, and a little chest pain/pressure.      History of Present Illness: Ricky Barnett is a 74 y.o. male who presents for  Pacemaker follow-up. He has not experienced syncope but has noticed worsening dyspnea, mostly NYHA class II. He has had mild ankle edema, occasional lightheadedness and occasional atypical brief chest pain at rest.  He presented in 2014 with symptomatic bradycardia secondary to second-degree atrioventricular block, Mobitz type I. He received a dual-chamber Engineer, site pacemaker.  Just before receiving the pacemaker, he underwent a stress myocardial perfusion study that showed normal findings and an ejection fraction of 56%. An echocardiogram that was performed after pacemaker implantation, January 2015 showed mildly reduced EF of 45-50 percent but was otherwise normal study.  Previously, he has not had symptoms of congestive heart failure.   Device interrogation shows virtually 100% ventricular pacing. He is not pacemaker dependent. He still has second degree AV block , but the AV conduction time is so long that native ventricular activation never occurs. Even increasing his AV delay to maximum today did not really make a big difference. Estimated generator longevity is 11 years. Lead parameters are excellent. He has had 2 brief episodes of nonsustained ventricular tachycardia each lasting only 5 beats. Both were asymptomatic. Heart rate histogram distribution is favorable (CO was never paces the atrium).    Past Medical History  Diagnosis Date  . Mastoiditis     right ear  . Hypertension   . Dysrhythmia     2-3 yrs ago    . Diabetes mellitus     was at one time lost weight amd no problem with it now. About 2-3 yrs ago  . Headache(784.0)     occas  . Arthritis     bil knees  . Angina     just started recently ,2-3 days ago-went to see Dr. Einar Gip 05/14/2011  . Palpitations   . Pacemaker 11/29/2012    dual chamber    Past Surgical History  Procedure Laterality Date  . Nasal sinus surgery      left side  . Cystectomy      under chin 1983  . Finger surgery      states he cut finger off when cutting wood  . Tympanoplasty  05/19/2011    Procedure: TYMPANOPLASTY;  Surgeon: Thornell Sartorius, MD;  Location: [place];  Service: ENT;  Laterality: Right;  Exploratory Tympanotomy  . 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 Permanent pacemaker insertion N/A 11/29/2012    Procedure: PERMANENT PACEMAKER INSERTION;  Surgeon: Sanda Klein, MD;  Location: [place] [facility];  Service: Cardiovascular;  Laterality: N/A;     Current Outpatient Prescriptions  Medication Sig Dispense Refill  . acetaminophen (TYLENOL) 325 MG tablet Take 1-2 tablets (325-650 mg total) by mouth every 4 (four) hours as needed.    Marland Kitchen amLODipine-benazepril (LOTREL) 5-20 MG per capsule Take 1 capsule by mouth daily.     . metFORMIN (GLUCOPHAGE-XR) 750 MG 24 hr tablet Take 750 mg by mouth 2 (two)  times daily.     . Multiple Vitamin (MULTIVITAMIN) tablet Take 1 tablet by mouth daily.    . pravastatin (PRAVACHOL) 40 MG tablet Take 40 mg by mouth daily.     . Tamsulosin HCl (FLOMAX) 0.4 MG CAPS Take 0.4 mg by mouth daily after supper.     No current facility-administered medications for this visit.    Allergies:   Review of patient's allergies indicates no known allergies.    Social History:  The patient  reports that he has never smoked. He has never used smokeless tobacco. He reports that he does not drink alcohol or use illicit drugs.   Family History:  The patient's family history includes Heart attack in his  father.    ROS:  Please see the history of present illness.    Otherwise, review of systems positive for none.   All other systems are reviewed and negative.    PHYSICAL EXAM: VS:  BP 136/82 mmHg  Pulse 74  Resp 20  Ht 5\' 11"  (1.803 m)  Wt 242 lb (109.77 kg)  BMI 33.77 kg/m2 , BMI Body mass index is 33.77 kg/(m^2).  General: Alert, oriented x3, no distress Head: no evidence of trauma, PERRL, EOMI, no exophtalmos or lid lag, no myxedema, no xanthelasma; normal ears, nose and oropharynx Neck: normal jugular venous pulsations and no hepatojugular reflux; brisk carotid pulses without delay and no carotid bruits Chest: clear to auscultation, no signs of consolidation by percussion or palpation, normal fremitus, symmetrical and full respiratory excursions,  Healthy left subclavian pacemaker site Cardiovascular: normal position and quality of the apical impulse, regular rhythm, normal first and second heart sounds, no  murmurs, rubs or gallops Abdomen: no tenderness or distention, no masses by palpation, no abnormal pulsatility or arterial bruits, normal bowel sounds, no hepatosplenomegaly Extremities: no clubbing, cyanosis or edema; 2+ radial, ulnar and brachial pulses bilaterally; 2+ right femoral, posterior tibial and dorsalis pedis pulses; 2+ left femoral, posterior tibial and dorsalis pedis pulses; no subclavian or femoral bruits Neurological: grossly nonfocal Psych: euthymic mood, full affect   EKG:  EKG is ordered today. The ekg ordered today demonstrates  Atrial sensed (sinus rhythm) , ventricular paced, QRS 182 ms, QTC 501 ms   Recent Labs: No results found for requested labs within last 365 days.    Lipid Panel No results found for: CHOL, TRIG, HDL, CHOLHDL, VLDL, LDLCALC, LDLDIRECT    Wt Readings from Last 3 Encounters:  10/13/14 242 lb (109.77 kg)  04/27/13 244 lb 8 oz (110.904 kg)  03/21/13 245 lb (111.131 kg)   .   ASSESSMENT AND PLAN:  1.  Possible congestive  heart failure. An echo performed about 18 months ago showed mildly depressed left ventricular systolic function, probably due to pacing induced asynchrony. It is possible that his cardiomyopathy has progressed. Options include upgrading to a cardiac resynchronization pacemaker if his EF is indeed low.  There are no overt findings on physical exam to suggest hypervolemia and weight is not changed. I have not yet prescribed a diuretic  He recently had labs with Dr. Ashby Dawes and will retrieve those results.  2.  Second-degree AV block Mobitz type I with symptomatic bradycardia and normally functioning dual-chamber permanent pacemaker.  Is not on any medications with negative affect on AV conduction.  Current medicines are reviewed at length with the patient today.  The patient does not have concerns regarding medicines.  The following changes have been made:  no change  Labs/ tests ordered today  include:  Orders Placed This Encounter  Procedures  . EKG 12-Lead  . ECHOCARDIOGRAM COMPLETE    Patient Instructions  Your physician has requested that you have an echocardiogram @ [address]. Echocardiography is a painless test that uses sound waves to create images of your heart. It provides your doctor with information about the size and shape of your heart and how well your heart's chambers and valves are working. This procedure takes approximately one hour. There are no restrictions for this procedure.  Remote monitoring is used to monitor your Pacemaker from home. This monitoring reduces the number of office visits required to check your device to one time per year. It allows [place] to keep an eye on the functioning of your device to ensure it is working properly. You are scheduled for a device check from home on 01/15/2015. You may send your transmission at any time that day. If you have a wireless device, the transmission will be sent automatically. After your physician reviews your  transmission, you will receive a postcard with your next transmission date.  Your physician wants you to follow-up in: 1 year with Dr. Sallyanne Kuster. You will receive a reminder letter in the mail two months in advance. If you don't receive a letter, please call our office to schedule the follow-up appointment.        Mikael Spray, MD  10/14/2014 5:56 PM    Sanda Klein, MD, [facility] [phone] office [phone] pager

## 2014-10-14 ENCOUNTER — Encounter: Payer: Self-pay | Admitting: Cardiovascular Disease

## 2014-10-14 DIAGNOSIS — I428 Other cardiomyopathies: Secondary | ICD-10-CM | POA: Insufficient documentation

## 2014-10-18 ENCOUNTER — Telehealth: Payer: Self-pay | Admitting: *Deleted

## 2014-10-18 ENCOUNTER — Ambulatory Visit (HOSPITAL_COMMUNITY): Payer: Commercial Managed Care - HMO | Attending: Cardiovascular Disease

## 2014-10-18 ENCOUNTER — Other Ambulatory Visit: Payer: Self-pay

## 2014-10-18 DIAGNOSIS — R0600 Dyspnea, unspecified: Secondary | ICD-10-CM

## 2014-10-18 DIAGNOSIS — Z8249 Family history of ischemic heart disease and other diseases of the circulatory system: Secondary | ICD-10-CM | POA: Diagnosis not present

## 2014-10-18 DIAGNOSIS — I517 Cardiomegaly: Secondary | ICD-10-CM | POA: Diagnosis not present

## 2014-10-18 DIAGNOSIS — I351 Nonrheumatic aortic (valve) insufficiency: Secondary | ICD-10-CM | POA: Diagnosis not present

## 2014-10-18 DIAGNOSIS — Z79899 Other long term (current) drug therapy: Secondary | ICD-10-CM

## 2014-10-18 DIAGNOSIS — I371 Nonrheumatic pulmonary valve insufficiency: Secondary | ICD-10-CM | POA: Insufficient documentation

## 2014-10-18 DIAGNOSIS — E119 Type 2 diabetes mellitus without complications: Secondary | ICD-10-CM | POA: Insufficient documentation

## 2014-10-18 DIAGNOSIS — I10 Essential (primary) hypertension: Secondary | ICD-10-CM | POA: Diagnosis not present

## 2014-10-18 DIAGNOSIS — I340 Nonrheumatic mitral (valve) insufficiency: Secondary | ICD-10-CM | POA: Diagnosis not present

## 2014-10-18 MED ORDER — FUROSEMIDE 20 MG PO TABS: 20.0000 mg | ORAL_TABLET | Freq: Every day | ORAL | Status: DC

## 2014-10-18 NOTE — Telephone Encounter (Signed)
Patient notified of echo results and med changes and lab orders. Patient voiced understanding. Med ordered. Patient will have labs at PCP office (called their practice and nurse put in order for BMET to have drawn). Patient is aware. He reports his son passed away in [place] so is handling this situation and will get medication and labs done when able.

## 2014-10-18 NOTE — Telephone Encounter (Signed)
Thank you. Sorry to hear that

## 2014-10-23 LAB — CUP PACEART INCLINIC DEVICE CHECK
Battery Remaining Longevity: 11
Brady Statistic RA Percent Paced: 14 %
Brady Statistic RV Percent Paced: 94 %
Date Time Interrogation Session: 20160822125433
Lead Channel Impedance Value: 654 Ohm
Lead Channel Impedance Value: 655 Ohm
Lead Channel Pacing Threshold Amplitude: 0.5 V
Lead Channel Pacing Threshold Amplitude: 0.7 V
Lead Channel Pacing Threshold Pulse Width: 0.5 ms
Lead Channel Pacing Threshold Pulse Width: 0.5 ms
Lead Channel Sensing Intrinsic Amplitude: 22.6 mV
Lead Channel Sensing Intrinsic Amplitude: 4.7 mV
Lead Channel Setting Pacing Amplitude: 2 V
Lead Channel Setting Pacing Amplitude: 2.5 V
Lead Channel Setting Pacing Pulse Width: 0.5 ms
Lead Channel Setting Sensing Sensitivity: 2.5 mV
Pulse Gen Serial Number: 112906
Zone Setting Detection Interval: 375 ms

## 2014-10-30 DIAGNOSIS — Z79899 Other long term (current) drug therapy: Secondary | ICD-10-CM | POA: Diagnosis not present

## 2014-11-08 ENCOUNTER — Telehealth: Payer: Self-pay | Admitting: Cardiovascular Disease

## 2014-11-08 NOTE — Telephone Encounter (Signed)
LMTCB

## 2014-11-08 NOTE — Telephone Encounter (Signed)
Please call,would like his test results(did not remember the name of the test) He wants to know if he still needs the procedure.

## 2014-11-13 ENCOUNTER — Encounter: Payer: Self-pay | Admitting: Cardiovascular Disease

## 2014-11-16 DIAGNOSIS — K029 Dental caries, unspecified: Secondary | ICD-10-CM | POA: Diagnosis not present

## 2014-12-22 NOTE — Patient Outreach (Addendum)
[place] [facility]) Care Management  12/22/2014  ERICSSON SCIORTINO 04/23/1940 [id number]   Referral from [place], assigned Enzo Montgomery, RN and Harlow Asa, PharmD for patient outreach.  Benelli Winther L. Tirso Laws, [place] Care Management Assistant

## 2014-12-27 ENCOUNTER — Other Ambulatory Visit: Payer: Self-pay

## 2014-12-27 DIAGNOSIS — IMO0001 Reserved for inherently not codable concepts without codable children: Secondary | ICD-10-CM

## 2014-12-27 DIAGNOSIS — E1165 Type 2 diabetes mellitus with hyperglycemia: Principal | ICD-10-CM

## 2014-12-27 NOTE — Patient Outreach (Signed)
[facility] [facility]) Care Management  12/27/2014  Ricky Barnett 1940/08/13 [id number]   Request from Enzo Montgomery, RN to assign Pharmacy and SW, assigned Harlow Asa, PharmD and Eula Fried, [place].  Thanks, Ronnell Freshwater. [place], [place] Assistant Phone: [phone] Fax: [phone]

## 2014-12-27 NOTE — Patient Outreach (Signed)
[facility]) Care Management  12/27/2014  Ricky Barnett 19-Sep-1940 [id number]   Care Coordination Note  Contact call to [facility] Pharmacist-Dawn Pettus. RNCM advised that patient has been without medication for five days and [facility] pharmacy referral sent.   Enzo Montgomery, RN,BSN,CCM [facility] Care Management Telephonic Care Mgmt. Coordinator Direct Phone: [phone] Fax: [phone]

## 2014-12-27 NOTE — Patient Outreach (Signed)
[place] [facility]) Care Management  12/27/2014  Ricky Barnett 09-Dec-1940 [id number]  Telephonic Screen  Referral Date: 12/22/14 Referral Source: [employer] Referral Reason: case management,education as well as medication adherence PCP: Dr. Ashby Dawes  Outreach attempt #1 to patient. Patient reached.  Social: Patient resides in his home with his spouse. He reports that he is in the process of relocating and will be moving on 01/06/15. He is independent with all ADLs. Spouse drives him to all MD appts. No recent falls. He does not use any assistive device at present. DME in the home consists of blood sugar monitor and heart pacer home monitor. He saw primary MD about two months ago.  Diabetes: Patient has type 2 DM. He has monitor in the home and is checking blood sugars. He reported blood sugars were within normal limits but couldn't recall readings. He reported only having two diabetic pills left to take.  HTN: He does not monitor blood pressure in the home. No BP machine. Patient reports he has not taken his meds in at least five days due to inability to afford them. He denies any symptoms at this time.   Dental: Patient states that he has been dealing with ongoing oral/dental issues. He is unable to afford dental care but has ten rotten teeth that needs extracting.    Medications: Patient states he takes about 4-5 medications. However, he is currently only taking oral diabetic med as he has been unable to afford the rest of his meds. Patient reported that his co-pay for meds has gone up to about $100.00 or more per med. He has not alerted MD regarding this matter.  Consent: Patient gave verbal consent for [facility] services. Patient declined [place] program but agreeable to [facility] Telephonic services.  Plan: RNCM will notify administrative assistant patient agreed to services and case opened. RNCM sent [place] referral for issues related to medication cost  and adherence. RNCM sent [facility] Referral ; financial assessment and dental resources. RNCM educated patient on medication adherence. RNCM identifies barrier to medication adherence related to medication cost. RNCM will send successful outreach letter to patient with [facility] introductory package and consent for [facility] services. RNCM advised of next follow up date within 30 days for initial assessment and care coordination services as needed. RN CM advised to please notify MD of any changes in condition prior to scheduled appt's.   RN CM provided contact name and # [phone] or main office # [phone] and 24-hour nurse line # [phone].   Enzo Montgomery, RN,BSN,CCM [facility] Care Management Telephonic Care Mgmt. Coordinator Direct Phone: [phone] Fax: [phone]

## 2014-12-28 ENCOUNTER — Other Ambulatory Visit: Payer: Self-pay

## 2014-12-28 NOTE — Patient Outreach (Addendum)
Subjective: Mr. Clowser is a 74 year old male who was referred to pharmacy due to not having most of his medications. I spoke with Mr. Luney and he stated his medications were at [facility] and going to cost over $50.  He does not have the money to purchase them.  I asked if had applied for Extra Help and he stated he did not.  He stated he went to social security this morning and got an application to apply.  I told him it would be faster to apply on line and he was interested in doing that.  Due to him not having his medications for the last 5 days and the risk of him being hospitalized, I stated he qualified for the Pharmacy Emergency Fund.    Objective: Outpatient Encounter Prescriptions as of 12/28/2014  Medication Sig  . acetaminophen (TYLENOL) 325 MG tablet Take 1-2 tablets (325-650 mg total) by mouth every 4 (four) hours as needed.  . metFORMIN (GLUCOPHAGE-XR) 750 MG 24 hr tablet Take 750 mg by mouth 2 (two) times daily.   . Multiple Vitamin (MULTIVITAMIN) tablet Take 1 tablet by mouth daily.  Marland Kitchen amLODipine-benazepril (LOTREL) 5-20 MG per capsule Take 1 capsule by mouth daily.   . furosemide (LASIX) 20 MG tablet Take 1 tablet (20 mg total) by mouth daily. (Patient not taking: Reported on 12/28/2014)  . pravastatin (PRAVACHOL) 40 MG tablet Take 40 mg by mouth daily.   . Tamsulosin HCl (FLOMAX) 0.4 MG CAPS Take 0.4 mg by mouth daily after supper.   No facility-administered encounter medications on file as of 12/28/2014.   Assessment: Medication adherence:  He is not adhering to his medications due to not being able to afford them.  He has not applied for Extra Help but is interested in doing that.      Drugs sorted by system:  Neurologic/Psychologic: none  Cardiovascular: amlodipine/benazepril, furosemide, pravastatin  Pulmonary/Allergy: none  Gastrointestinal: none  Endocrine: metformin  Renal: tamsulosin  Topical: none  Pain: acetaminophen  Vitamins/Minerals:  multivitamin  Infectious Diseases: none  Miscellaneous:   Duplications in therapy: none Gaps in therapy: none Medications to avoid in the elderly: none Drug interactions: none Other issues noted: none  Plan: 1.  [place]In[place] Mr. Budner qualifies for the Pharmacy Emergency Fund.  I will purchase a 30 day supply of medications and meet him at the pharmacy to have him pick them up.  2.  I completed the application for Extra Help for Mr. Mcglone and his wife.  He will hear in the next 2 to 4 weeks if he qualifies.  3.  I gave him the number for the [place] program and recommended he follow up with to determine the best insurance for him when he moves.  4.  He stated he is moving to [place], [place] on 01/06/15.  He will no longer qualify for the [facility] care management program.  I will close his case to pharmacy.  5.  I have communicated this to his telephonic nurse care manager, Ezzie Dural, RN. 6.  I will send a note to his provider as well.    Deanne Coffer, PharmD, [place] [phone]

## 2015-01-01 ENCOUNTER — Other Ambulatory Visit: Payer: Self-pay | Admitting: Licensed Clinical Social Worker

## 2015-01-01 NOTE — Patient Outreach (Signed)
[facility]) Care Management  01/01/2015  Ricky Barnett 1940/10/14 [id number]   Assessment-CSW received referral for patient to provide dental resources. CSW completed initial outreach and was successfully able to reach patient. HIPPA verifications received. Patient reports that he will be moving to [place] on 01/06/15 and is aware that he will no longer be eligible for [facility] program after relocation. Patient agreeable to receiving resources for dental assistance. CSW provided dental resources and contact information over the phone for patient to write down: [facility], [facility], [facility], [place] and [facility]. Patient reported information helpful and that he would contact agencies. Patient shares that he has a friend that has used [facility] and "they had a good experience so I will start there." CSW will discharge patient from caseload at this time as dental assistance resources provided before patient will no longer be eligible for [facility] program after relocating to [place], [place].  Plan-CSW will inform RNCM of discharge.  Ricky Barnett, BSW, MSW, [email or web address] Phone: [phone] Fax: [phone]

## 2015-01-04 ENCOUNTER — Other Ambulatory Visit: Payer: Self-pay

## 2015-01-04 NOTE — Patient Outreach (Signed)
[place] [facility]) Care Management  01/04/2015  Ricky Barnett 07-Oct-1940 [id number]   Care Coordination:   Outreach call to patient to assist with any further care coordination needs prior to discharge from services. Patient moving out of [facility] service area on 01/06/15. No answer at present.  Plan: RN CM will attempt to reach patient within the next week to assist with any further needs and close case.  Enzo Montgomery, RN,BSN,CCM [place] Management Telephonic Care Management Coordinator Direct Phone: [phone] Toll Free: [phone] Fax: [phone]

## 2015-01-04 NOTE — Patient Outreach (Signed)
[place] [facility]) Care Management  01/04/2015  Ricky Barnett 1940/09/27 [id number]   Care Coordination/Case Closure:   Incoming call from patient returning RN CM previous call. Patient states that he is doing well. He is very appreciative of [facility] services that assisted with getting his meds and provided further info/resources. RN CM confirmed with patient that he still plans to move on 01/06/15 and is all set for the move. Patient aware that he is being discharged from [facility] services as he is relocating out of [facility] service area to [facility]. All other [facility] disciplines aware of patient's status and has closed out case. Patient is aware of need to contact [facility] insurance regarding his move. He states that he received a letter in the mail on yesterday from [facility] regarding the matter and what to do. He is arranging to find new MDs in the area as well. He denies any further care needs at this time.    Plan: RN CM will notify [facility] administrative assistant of case closure. RN CM will send case closure letter to patient. RN CM will send case closure notification to primary MD. RN CM confirmed that patient is aware of how to seek medical care/attention if any medical needs arise.   Enzo Montgomery, RN,BSN,CCM [place] Management Telephonic Care Management Coordinator Direct Phone: [phone] Toll Free: [phone] Fax: [phone]

## 2015-01-10 ENCOUNTER — Ambulatory Visit: Payer: Commercial Managed Care - HMO

## 2015-01-10 NOTE — Patient Outreach (Signed)
[place] [facility]) Care Management  01/10/2015  Ricky Barnett October 02, 1940 [id number]   Notification received from [facility], RN to close case due to patient relocating out of the area outside of [facility] network.  [name] L. Shelbia Scinto, [place] Care Management Assistant

## 2015-01-15 ENCOUNTER — Ambulatory Visit (INDEPENDENT_AMBULATORY_CARE_PROVIDER_SITE_OTHER): Payer: Commercial Managed Care - HMO | Admitting: *Deleted

## 2015-01-15 DIAGNOSIS — I441 Atrioventricular block, second degree: Secondary | ICD-10-CM

## 2015-01-15 NOTE — Progress Notes (Signed)
Remote pacemaker transmission.   

## 2015-01-24 LAB — CUP PACEART REMOTE DEVICE CHECK
Battery Remaining Longevity: 132 mo
Brady Statistic RA Percent Paced: 23 %
Brady Statistic RV Percent Paced: 89 %
Date Time Interrogation Session: 20161114055100
Implantable Lead Location: 753859
Implantable Lead Location: 753860
Implantable Lead Model: 4136
Implantable Lead Serial Number: 29473237
Lead Channel Impedance Value: 681 Ohm
Lead Channel Setting Pacing Amplitude: 2 V
Lead Channel Setting Pacing Pulse Width: 0.5 ms
MDC IDC LEAD IMPLANT DT: 20140929
MDC IDC LEAD IMPLANT DT: 20140929
MDC IDC LEAD MODEL: 4137
MDC IDC LEAD SERIAL: 29365186
MDC IDC MSMT BATTERY REMAINING PERCENTAGE: 100 %
MDC IDC MSMT LEADCHNL RA PACING THRESHOLD AMPLITUDE: 0.5 V
MDC IDC MSMT LEADCHNL RA PACING THRESHOLD PULSEWIDTH: 0.5 ms
MDC IDC MSMT LEADCHNL RV IMPEDANCE VALUE: 656 Ohm
MDC IDC PG SERIAL: 112906
MDC IDC SET LEADCHNL RV PACING AMPLITUDE: 2.5 V
MDC IDC SET LEADCHNL RV SENSING SENSITIVITY: 2.5 mV

## 2015-01-31 ENCOUNTER — Encounter: Payer: Self-pay | Admitting: Cardiology

## 2015-04-16 ENCOUNTER — Ambulatory Visit (INDEPENDENT_AMBULATORY_CARE_PROVIDER_SITE_OTHER): Payer: Medicare Other | Admitting: *Deleted

## 2015-04-16 DIAGNOSIS — I441 Atrioventricular block, second degree: Secondary | ICD-10-CM | POA: Diagnosis not present

## 2015-04-16 NOTE — Progress Notes (Signed)
Remote pacemaker transmission.   

## 2015-04-18 ENCOUNTER — Encounter: Payer: Self-pay | Admitting: Cardiology

## 2015-04-18 LAB — CUP PACEART REMOTE DEVICE CHECK
Battery Remaining Longevity: 126 mo
Battery Remaining Percentage: 100 %
Brady Statistic RV Percent Paced: 88 %
Date Time Interrogation Session: 20170213055100
Implantable Lead Implant Date: 20140929
Implantable Lead Location: 753859
Implantable Lead Model: 4137
Implantable Lead Serial Number: 29473237
Lead Channel Setting Pacing Pulse Width: 0.5 ms
Lead Channel Setting Sensing Sensitivity: 2.5 mV
MDC IDC LEAD IMPLANT DT: 20140929
MDC IDC LEAD LOCATION: 753860
MDC IDC LEAD MODEL: 4136
MDC IDC LEAD SERIAL: 29365186
MDC IDC MSMT LEADCHNL RA IMPEDANCE VALUE: 714 Ohm
MDC IDC MSMT LEADCHNL RA SENSING INTR AMPL: 7.2 mV
MDC IDC MSMT LEADCHNL RV IMPEDANCE VALUE: 655 Ohm
MDC IDC MSMT LEADCHNL RV SENSING INTR AMPL: 25 mV — AB
MDC IDC PG SERIAL: 112906
MDC IDC SET LEADCHNL RA PACING AMPLITUDE: 2 V
MDC IDC SET LEADCHNL RV PACING AMPLITUDE: 2.5 V
MDC IDC STAT BRADY RA PERCENT PACED: 29 %

## 2015-07-16 ENCOUNTER — Telehealth: Payer: Self-pay | Admitting: Cardiology

## 2015-07-16 ENCOUNTER — Encounter: Payer: Medicare Other | Admitting: *Deleted

## 2015-07-16 NOTE — Telephone Encounter (Signed)
Attempted to confirm remote transmission with pt. No answer and was unable to leave a message.   

## 2015-07-20 ENCOUNTER — Encounter: Payer: Self-pay | Admitting: Cardiology

## 2015-11-29 ENCOUNTER — Telehealth: Payer: Self-pay | Admitting: *Deleted

## 2015-11-29 ENCOUNTER — Encounter: Payer: Self-pay | Admitting: *Deleted

## 2015-11-29 NOTE — Progress Notes (Signed)
Opened in error

## 2015-11-29 NOTE — Telephone Encounter (Signed)
Attempted to reach patient regarding PPM follow-up.  Unable to leave VM as inbox is full.  Contacted the [place] [facility] [place] and confirmed that patient has transferred his care there.

## 2016-06-12 DIAGNOSIS — K529 Noninfective gastroenteritis and colitis, unspecified: Secondary | ICD-10-CM | POA: Insufficient documentation

## 2016-10-09 DIAGNOSIS — K810 Acute cholecystitis: Secondary | ICD-10-CM | POA: Insufficient documentation

## 2017-04-01 ENCOUNTER — Telehealth: Payer: Self-pay | Admitting: Cardiovascular Disease

## 2017-04-01 NOTE — Telephone Encounter (Signed)
Spoke with pt, he has recently moved back to this area and wants to start seeing [place] again. When questioned about symptoms, he was vague and said he would be fine until his follow up appointment. Patient voiced understanding to call otr go to the ER with any problems.

## 2017-04-01 NOTE — Telephone Encounter (Signed)
New message    Patient calling with concerns of chest pain and fatigue. Please call patient [phone]  Pt c/o of Chest Pain: STAT if CP now or developed within 24 hours  1. Are you having CP right now? No  2. Are you experiencing any other symptoms (ex. SOB, nausea, vomiting, sweating)? No  3. How long have you been experiencing CP? 3 days  4. Is your CP continuous or coming and going? Coming and going  5. Have you taken Nitroglycerin? No ?

## 2017-04-20 ENCOUNTER — Ambulatory Visit: Payer: Medicare Other | Admitting: Physician Assistant

## 2017-04-20 NOTE — Progress Notes (Deleted)
Cardiology Office Note   Date:  04/20/2017   ID:  Ricky Barnett, DOB Aug 24, 1940, MRN [id number]  PCP:  Merrilee Seashore, MD  Cardiologist: Dr. Sallyanne Kuster, 10/13/2014 Rosaria Ferries, PA-C   No chief complaint on file.   History of Present Illness: Ricky Barnett is a 77 y.o. male with a history of AV block s/p BSci PPM 2014, DM, HTN, OA, MV 2014 w/ EF 56%  Ricky Barnett presents for ***   Past Medical History:  Diagnosis Date  . Angina    just started recently ,2-3 days ago-went to see Dr. Einar Gip 05/14/2011  . Arthritis    bil knees  . Diabetes mellitus    was at one time lost weight amd no problem with it now. About 2-3 yrs ago  . Dysrhythmia    2-3 yrs ago  . Headache(784.0)    occas  . Hypertension   . Mastoiditis    right ear  . Pacemaker 11/29/2012   dual chamber  . Palpitations     Past Surgical History:  Procedure Laterality Date  . CYSTECTOMY     under chin 1983  . FINGER SURGERY     states he cut finger off when cutting wood  . INSERT / REPLACE / REMOVE PACEMAKER  11/29/2012   [employer]   . NASAL SINUS SURGERY     left side  . PERMANENT PACEMAKER INSERTION N/A 11/29/2012   Procedure: PERMANENT PACEMAKER INSERTION;  Surgeon: Sanda Klein, MD;  Location: [facility] CATH LAB;  Service: Cardiovascular;  Laterality: N/A;  . TYMPANOPLASTY  05/19/2011   Procedure: TYMPANOPLASTY;  Surgeon: Thornell Sartorius, MD;  Location: [facility] OR;  Service: ENT;  Laterality: Right;  Exploratory Tympanotomy    Current Outpatient Medications  Medication Sig Dispense Refill  . acetaminophen (TYLENOL) 325 MG tablet Take 1-2 tablets (325-650 mg total) by mouth every 4 (four) hours as needed.    Marland Kitchen amLODipine-benazepril (LOTREL) 5-20 MG per capsule Take 1 capsule by mouth daily.     . furosemide (LASIX) 20 MG tablet Take 1 tablet (20 mg total) by mouth daily. (Patient not taking: Reported on 12/28/2014) 30 tablet 3  . metFORMIN (GLUCOPHAGE-XR) 750 MG 24 hr tablet Take 750 mg by  mouth 2 (two) times daily.     . Multiple Vitamin (MULTIVITAMIN) tablet Take 1 tablet by mouth daily.    . pravastatin (PRAVACHOL) 40 MG tablet Take 40 mg by mouth daily.     . Tamsulosin HCl (FLOMAX) 0.4 MG CAPS Take 0.4 mg by mouth daily after supper.     No current facility-administered medications for this visit.     Allergies:   Patient has no known allergies.    Social History:  The patient  reports that  has never smoked. he has never used smokeless tobacco. He reports that he does not drink alcohol or use drugs.   Family History:  The patient's family history includes Heart attack in his father.    ROS:  Please see the history of present illness. All other systems are reviewed and negative.    PHYSICAL EXAM: VS:  There were no vitals taken for this visit. , BMI There is no height or weight on file to calculate BMI. GEN: Well nourished, well developed, male in no acute distress  HEENT: normal for age  Neck: no JVD, no carotid bruit, no masses Cardiac: RRR; no murmur, no rubs, or gallops Respiratory:  clear to auscultation bilaterally, normal work of breathing GI:  soft, nontender, nondistended, + BS MS: no deformity or atrophy; no edema; distal pulses are 2+ in all 4 extremities   Skin: warm and dry, no rash Neuro:  Strength and sensation are intact Psych: euthymic mood, full affect   EKG:  EKG {ACTION; IS/IS [id number]} ordered today. The ekg ordered today demonstrates ***   Recent Labs: No results found for requested labs within last 8760 hours.    Lipid Panel No results found for: CHOL, TRIG, HDL, CHOLHDL, VLDL, LDLCALC, LDLDIRECT   Wt Readings from Last 3 Encounters:  10/13/14 242 lb (109.8 kg)  04/27/13 244 lb 8 oz (110.9 kg)  03/21/13 245 lb (111.1 kg)     Other studies Reviewed: Additional studies/ records that were reviewed today include: ***.  ASSESSMENT AND PLAN:  1.  ***   Current medicines are reviewed at length with the patient today.   The patient {ACTIONS; HAS/DOES NOT HAVE:19233} concerns regarding medicines.  The following changes have been made:  {PLAN; NO CHANGE:13088:s}  Labs/ tests ordered today include: *** No orders of the defined types were placed in this encounter.    Disposition:   FU with Dr. Sallyanne Kuster  Signed, Rosaria Ferries, PA-C  04/20/2017 8:00 AM    [place] Phone: [phone]; Fax: [phone]  This note was written with the assistance of speech recognition software. Please excuse any transcriptional errors.

## 2017-04-23 ENCOUNTER — Ambulatory Visit: Payer: Medicare HMO | Admitting: Cardiology

## 2017-04-23 ENCOUNTER — Encounter: Payer: Self-pay | Admitting: Cardiology

## 2017-04-23 VITALS — BP 141/90 | HR 76 | Ht 71.0 in | Wt 228.2 lb

## 2017-04-23 DIAGNOSIS — E119 Type 2 diabetes mellitus without complications: Secondary | ICD-10-CM | POA: Diagnosis not present

## 2017-04-23 DIAGNOSIS — Z79899 Other long term (current) drug therapy: Secondary | ICD-10-CM | POA: Diagnosis not present

## 2017-04-23 DIAGNOSIS — I10 Essential (primary) hypertension: Secondary | ICD-10-CM

## 2017-04-23 DIAGNOSIS — R0602 Shortness of breath: Secondary | ICD-10-CM

## 2017-04-23 DIAGNOSIS — I428 Other cardiomyopathies: Secondary | ICD-10-CM

## 2017-04-23 DIAGNOSIS — Z950 Presence of cardiac pacemaker: Secondary | ICD-10-CM

## 2017-04-23 DIAGNOSIS — I5043 Acute on chronic combined systolic (congestive) and diastolic (congestive) heart failure: Secondary | ICD-10-CM | POA: Diagnosis not present

## 2017-04-23 MED ORDER — POTASSIUM CHLORIDE CRYS ER 20 MEQ PO TBCR: 20.0000 meq | EXTENDED_RELEASE_TABLET | Freq: Every day | ORAL | 3 refills | Status: DC

## 2017-04-23 MED ORDER — FUROSEMIDE 40 MG PO TABS: 40.0000 mg | ORAL_TABLET | Freq: Every day | ORAL | 3 refills | Status: DC

## 2017-04-23 NOTE — Patient Instructions (Signed)
Medication Instructions:  START- Furosemide 40 mg daily and Potassium 20 meq daily  If you need a refill on your cardiac medications before your next appointment, please call your pharmacy.  Labwork: BMP, BNP and CBC HERE IN OUR OFFICE AT [facility]  Take the provided lab slips for you to take with you to the lab for you blood draw.   You will NOT need to fast   Testing/Procedures: None Ordered   Follow-Up: Your physician wants you to follow-up in: 2 Weeks with Kerin Ransom, PA.     Thank you for choosing [facility] at [facility]!!

## 2017-04-23 NOTE — Assessment & Plan Note (Signed)
On Pravachol 

## 2017-04-23 NOTE — Assessment & Plan Note (Addendum)
Generator [employer] Advantio model [id number] DR EL serial number [id number] Placed 11/29/2012 - Dr. Sallyanne Kuster

## 2017-04-23 NOTE — Assessment & Plan Note (Signed)
Repeat B/p by me 126/66

## 2017-04-23 NOTE — Progress Notes (Signed)
04/23/2017 Ricky Barnett   [birth year]  [id number]  Primary Physician Merrilee Seashore, MD Primary Cardiologist: Dr Sallyanne Kuster  HPI:  Pleasant 77 y/o AA male, retired Administrator, from [place], though he has actually lived most of his adult life in [place]. He came back to [place] in 2013 to help care for his mother. He was found to have type 1 second degree AVB then and transient CHB post op right mastoid tympanoplasty March 2013. He was felt to be asymptomatic and was followed medically. A Myoview done in 2014 was low risk, echo in 2016 showed his EF to be 40-45%. An OP monitor showed transient Wenckebach with HR in the 30's with symptoms of dizziness. On 9/29/1 he had a MDT pacemaker placed by Dr Sallyanne Kuster. His LOV was July 2016, last pacemaker transmission was Jan 2017.   He then moved back to [place]. He was seen there by Dr Standley Dakins at [facility] health. He had an echo at one point but the records in [place] are incomplete. He and his wife indicate he has done well from a cardiac standpoint. He is now back in there area and wanted get reestablished. He does admit to some SOB, worse when he lays flat. Dr Lyanne Co had placed him Lasix at one point for similar symptoms but it was only for a short time.    Current Outpatient Medications  Medication Sig Dispense Refill  . acetaminophen (TYLENOL) 325 MG tablet Take 1-2 tablets (325-650 mg total) by mouth every 4 (four) hours as needed.    Marland Kitchen amLODipine-benazepril (LOTREL) 5-20 MG per capsule Take 1 capsule by mouth daily.     . metFORMIN (GLUCOPHAGE-XR) 750 MG 24 hr tablet Take 750 mg by mouth 2 (two) times daily.     . metoprolol succinate (TOPROL-XL) 50 MG 24 hr tablet Take 50 mg by mouth 2 (two) times daily. Take with or immediately following a meal.    . Multiple Vitamin (MULTIVITAMIN) tablet Take 1 tablet by mouth daily.    . pravastatin (PRAVACHOL) 40 MG tablet Take 40 mg by mouth daily.     . Tamsulosin HCl (FLOMAX) 0.4 MG CAPS  Take 0.4 mg by mouth daily after supper.    . furosemide (LASIX) 40 MG tablet Take 1 tablet (40 mg total) by mouth daily. 90 tablet 3  . potassium chloride SA (K-DUR,KLOR-CON) 20 MEQ tablet Take 1 tablet (20 mEq total) by mouth daily. 90 tablet 3   No current facility-administered medications for this visit.     No Known Allergies  Past Medical History:  Diagnosis Date  . Angina    just started recently ,2-3 days ago-went to see Dr. Einar Gip 05/14/2011  . Arthritis    bil knees  . Diabetes mellitus    was at one time lost weight amd no problem with it now. About 2-3 yrs ago  . Dysrhythmia    2-3 yrs ago  . Headache(784.0)    occas  . Hypertension   . Mastoiditis    right ear  . Pacemaker 11/29/2012   dual chamber  . Palpitations     Social History   Socioeconomic History  . Marital status: Married    Spouse name: Not on file  . Number of children: Not on file  . Years of education: Not on file  . Highest education level: Not on file  Social Needs  . Financial resource strain: Not on file  . Food insecurity - worry: Not  on file  . Food insecurity - inability: Not on file  . Transportation needs - medical: Not on file  . Transportation needs - non-medical: Not on file  Occupational History  . Not on file  Tobacco Use  . Smoking status: Never Smoker  . Smokeless tobacco: Never Used  Substance and Sexual Activity  . Alcohol use: No  . Drug use: No  . Sexual activity: Not on file  Other Topics Concern  . Not on file  Social History Narrative  . Not on file     Family History  Problem Relation Age of Onset  . Heart attack Father      Review of Systems: General: negative for chills, fever, night sweats or weight changes.  Cardiovascular: negative for chest pain, dyspnea on exertion, edema, orthopnea, palpitations, paroxysmal nocturnal dyspnea or shortness of breath Dermatological: negative for rash Respiratory: negative for cough or wheezing Urologic:  negative for hematuria Abdominal: negative for nausea, vomiting, diarrhea, bright red blood per rectum, melena, or hematemesis Neurologic: negative for visual changes, syncope, or dizziness All other systems reviewed and are otherwise negative except as noted above.    Blood pressure (!) 141/90, pulse 76, height 5\' 11"  (1.803 m), weight 228 lb 3.2 oz (103.5 kg).  General appearance: alert, cooperative and no distress Neck: no carotid bruit and JVD noted when recumbant Lungs: clear to auscultation bilaterally Heart: regular rate and rhythm Extremities: extremities normal, atraumatic, no cyanosis or edema Skin: Skin color, texture, turgor normal. No rashes or lesions Neurologic: Grossly normal  EKG NSR, tracking A pacing V, one PVC  ASSESSMENT AND PLAN:   Acute on chronic combined systolic and diastolic CHF (congestive heart failure) (HCC) Pt has volume overload on exam and history of orthopnea  Nonischemic cardiomyopathy ([place]) Last EF I could locate showed his EF to be 40-45% by echo 2016- he had an echo in [place] 2017 but that was not available to me.   Essential hypertension Repeat B/p by me 126/66  Pacemaker [employer] model [id number] DR EL serial number [id number] Placed 11/29/2012 - Dr. Sallyanne Kuster  Non-insulin dependent type 2 diabetes mellitus ([place]) On Glucophage, SCR WNL Aug 2018  Dyslipidemia On Pravachol   PLAN  I suggested he resume Lasix 40 mg daily and K+-dur 20 MEQ. I ordered a BMP, CBC, and BNP today. He'll need a pacer check and a f/u office visit in a week or two. We discuss low sodium diet.   Kerin Ransom PA-C 04/23/2017 4:34 PM

## 2017-04-23 NOTE — Assessment & Plan Note (Signed)
Pt has volume overload on exam and history of orthopnea

## 2017-04-23 NOTE — Assessment & Plan Note (Signed)
Last EF I could locate showed his EF to be 40-45% by echo 2016- he had an echo in [place] 2017 but that was not available to me.

## 2017-04-23 NOTE — Assessment & Plan Note (Signed)
On Glucophage, [facility] WNL Aug 2018

## 2017-04-24 LAB — CBC
Hematocrit: 39.4 % (ref 37.5–51.0)
Hemoglobin: 12.5 g/dL — ABNORMAL LOW (ref 13.0–17.7)
MCH: 25.5 pg — ABNORMAL LOW (ref 26.6–33.0)
MCHC: 31.7 g/dL (ref 31.5–35.7)
MCV: 80 fL (ref 79–97)
Platelets: 287 10*3/uL (ref 150–379)
RBC: 4.91 x10E6/uL (ref 4.14–5.80)
RDW: 14.9 % (ref 12.3–15.4)
WBC: 5.5 10*3/uL (ref 3.4–10.8)

## 2017-04-24 LAB — BRAIN NATRIURETIC PEPTIDE: BNP: 836 pg/mL — ABNORMAL HIGH (ref 0.0–100.0)

## 2017-04-24 LAB — BASIC METABOLIC PANEL
BUN/Creatinine Ratio: 8 — ABNORMAL LOW (ref 10–24)
BUN: 10 mg/dL (ref 8–27)
CO2: 24 mmol/L (ref 20–29)
Calcium: 8.7 mg/dL (ref 8.6–10.2)
Chloride: 103 mmol/L (ref 96–106)
Creatinine, Ser: 1.18 mg/dL (ref 0.76–1.27)
GFR calc Af Amer: 68 mL/min/{1.73_m2} (ref >59)
GFR calc non Af Amer: 59 mL/min/{1.73_m2} — ABNORMAL LOW (ref >59)
Glucose: 103 mg/dL — ABNORMAL HIGH (ref 65–99)
Potassium: 4.5 mmol/L (ref 3.5–5.2)
Sodium: 143 mmol/L (ref 134–144)

## 2017-05-07 ENCOUNTER — Ambulatory Visit: Payer: Medicare HMO | Admitting: Cardiology

## 2017-06-02 ENCOUNTER — Ambulatory Visit: Payer: Medicare HMO | Admitting: Cardiology

## 2017-06-25 ENCOUNTER — Ambulatory Visit: Payer: Medicare HMO | Admitting: Cardiology

## 2017-07-06 DIAGNOSIS — I509 Heart failure, unspecified: Secondary | ICD-10-CM | POA: Diagnosis not present

## 2017-07-06 DIAGNOSIS — I10 Essential (primary) hypertension: Secondary | ICD-10-CM | POA: Diagnosis not present

## 2017-07-06 DIAGNOSIS — E119 Type 2 diabetes mellitus without complications: Secondary | ICD-10-CM | POA: Diagnosis not present

## 2017-07-06 DIAGNOSIS — E1169 Type 2 diabetes mellitus with other specified complication: Secondary | ICD-10-CM | POA: Diagnosis not present

## 2017-07-06 DIAGNOSIS — I5042 Chronic combined systolic (congestive) and diastolic (congestive) heart failure: Secondary | ICD-10-CM | POA: Diagnosis not present

## 2017-07-06 DIAGNOSIS — E118 Type 2 diabetes mellitus with unspecified complications: Secondary | ICD-10-CM | POA: Diagnosis not present

## 2017-07-06 DIAGNOSIS — E782 Mixed hyperlipidemia: Secondary | ICD-10-CM | POA: Diagnosis not present

## 2017-07-13 ENCOUNTER — Telehealth: Payer: Self-pay

## 2017-07-13 ENCOUNTER — Other Ambulatory Visit: Payer: Self-pay

## 2017-07-13 MED ORDER — PRAVASTATIN SODIUM 40 MG PO TABS: 40.0000 mg | ORAL_TABLET | Freq: Every day | ORAL | 2 refills | Status: DC

## 2017-07-13 MED ORDER — AMLODIPINE BESY-BENAZEPRIL HCL 5-20 MG PO CAPS: 1.0000 | ORAL_CAPSULE | Freq: Every day | ORAL | 2 refills | Status: DC

## 2017-07-13 NOTE — Telephone Encounter (Signed)
Spoke with pt and advised per Dr. Sallyanne Kuster he is to have pcp refill flomax and metformin medication. Pt verbalized understanding.

## 2017-07-13 NOTE — Telephone Encounter (Signed)
Please refer metformin and tamsulosin to PCP

## 2017-07-23 ENCOUNTER — Ambulatory Visit: Payer: Medicare HMO | Admitting: Cardiology

## 2017-07-23 ENCOUNTER — Encounter: Payer: Self-pay | Admitting: Cardiology

## 2017-07-23 DIAGNOSIS — Z950 Presence of cardiac pacemaker: Secondary | ICD-10-CM

## 2017-07-23 DIAGNOSIS — R6889 Other general symptoms and signs: Secondary | ICD-10-CM | POA: Diagnosis not present

## 2017-07-23 DIAGNOSIS — I5043 Acute on chronic combined systolic (congestive) and diastolic (congestive) heart failure: Secondary | ICD-10-CM

## 2017-07-23 DIAGNOSIS — E119 Type 2 diabetes mellitus without complications: Secondary | ICD-10-CM | POA: Diagnosis not present

## 2017-07-23 DIAGNOSIS — I428 Other cardiomyopathies: Secondary | ICD-10-CM | POA: Diagnosis not present

## 2017-07-23 DIAGNOSIS — I10 Essential (primary) hypertension: Secondary | ICD-10-CM

## 2017-07-23 NOTE — Progress Notes (Signed)
07/23/2017 Ricky Barnett   07/05/1940  [id number]  Primary Physician Merrilee Seashore, MD Primary Cardiologist: Dr Sallyanne Kuster  HPI:  Pleasant 77 y/o AA male, retired Administrator, from [place], though he has actually lived most of his adult life in [place]. He came back to [place] in 2013 to help care for his mother. He was found to have type 1 second degree AVB then and transient CHB post op right mastoid tympanoplasty March 2013. He was felt to be asymptomatic and was followed medically. A Myoview done in 2014 was low risk, echo in 2016 showed his EF to be 40-45%. An OP monitor showed transient Wenckebach with HR in the 30's with symptoms of dizziness. On 11/29/12 he had a BS pacemaker placed by Dr Sallyanne Kuster. His LOV was July 2016, last pacemaker transmission was Jan 2017.   He then moved back to [place]. He was seen there by Dr Standley Dakins at [facility]. He had an echo at one point but the records in [place] are incomplete. He and his wife indicated at his [place] in Feb that he had done well from a cardiac standpoint. He does admit at that OV to some SOB, worse when he lay flat. Labs done then showed a BNP of 836. I increased his Lasix. He is in the office today for follow up. He is better, less SOB but he admits he self limits his activity secondary to exertional dyspnea. No orthopnea or PND, no LE edema. He is in the office today with his wife who is seeing Dr Sallyanne Kuster.     Current Outpatient Medications  Medication Sig Dispense Refill  . acetaminophen (TYLENOL) 325 MG tablet Take 1-2 tablets (325-650 mg total) by mouth every 4 (four) hours as needed.    Marland Kitchen amLODipine-benazepril (LOTREL) 5-20 MG capsule Take 1 capsule by mouth daily. 90 capsule 2  . furosemide (LASIX) 40 MG tablet Take 1 tablet (40 mg total) by mouth daily. 90 tablet 3  . metFORMIN (GLUCOPHAGE-XR) 750 MG 24 hr tablet Take 750 mg by mouth 2 (two) times daily.     . metoprolol succinate (TOPROL-XL) 50 MG 24 hr  tablet Take 50 mg by mouth 2 (two) times daily. Take with or immediately following a meal.    . Multiple Vitamin (MULTIVITAMIN) tablet Take 1 tablet by mouth daily.    . potassium chloride SA (K-DUR,KLOR-CON) 20 MEQ tablet Take 1 tablet (20 mEq total) by mouth daily. 90 tablet 3  . pravastatin (PRAVACHOL) 40 MG tablet Take 1 tablet (40 mg total) by mouth daily. 90 tablet 2  . Tamsulosin HCl (FLOMAX) 0.4 MG CAPS Take 0.4 mg by mouth daily after supper.     No current facility-administered medications for this visit.     No Known Allergies  Past Medical History:  Diagnosis Date  . Angina    just started recently ,2-3 days ago-went to see Dr. Einar Gip 05/14/2011  . Arthritis    bil knees  . Diabetes mellitus    was at one time lost weight amd no problem with it now. About 2-3 yrs ago  . Dysrhythmia    2-3 yrs ago  . Headache(784.0)    occas  . Hypertension   . Mastoiditis    right ear  . Pacemaker 11/29/2012   dual chamber  . Palpitations     Social History   Socioeconomic History  . Marital status: Married    Spouse name: Not on file  .  Number of children: Not on file  . Years of education: Not on file  . Highest education level: Not on file  Occupational History  . Not on file  Social Needs  . Financial resource strain: Not on file  . Food insecurity:    Worry: Not on file    Inability: Not on file  . Transportation needs:    Medical: Not on file    Non-medical: Not on file  Tobacco Use  . Smoking status: Never Smoker  . Smokeless tobacco: Never Used  Substance and Sexual Activity  . Alcohol use: No  . Drug use: No  . Sexual activity: Not on file  Lifestyle  . Physical activity:    Days per week: Not on file    Minutes per session: Not on file  . Stress: Not on file  Relationships  . Social connections:    Talks on phone: Not on file    Gets together: Not on file    Attends religious service: Not on file    Active member of club or organization: Not on  file    Attends meetings of clubs or organizations: Not on file    Relationship status: Not on file  . Intimate partner violence:    Fear of current or ex partner: Not on file    Emotionally abused: Not on file    Physically abused: Not on file    Forced sexual activity: Not on file  Other Topics Concern  . Not on file  Social History Narrative  . Not on file     Family History  Problem Relation Age of Onset  . Heart attack Father      Review of Systems: General: negative for chills, fever, night sweats or weight changes.  Cardiovascular: negative for chest pain, dyspnea on exertion, edema, orthopnea, palpitations, paroxysmal nocturnal dyspnea or shortness of breath Dermatological: negative for rash Respiratory: negative for cough or wheezing Urologic: negative for hematuria Abdominal: negative for nausea, vomiting, diarrhea, bright red blood per rectum, melena, or hematemesis Neurologic: negative for visual changes, syncope, or dizziness All other systems reviewed and are otherwise negative except as noted above.    Blood pressure 110/72, pulse 76, height 5\' 11"  (1.803 m), weight 219 lb 3.2 oz (99.4 kg).  General appearance: alert, cooperative and no distress Neck: no carotid bruit and no JVD Lungs: clear to auscultation bilaterally Heart: regular rate and rhythm and extra systole noted Extremities: extremities normal, atraumatic, no cyanosis or edema Skin: Skin color, texture, turgor normal. No rashes or lesions Neurologic: Grossly normal  EKG paced rhythm with PVCs  ASSESSMENT AND PLAN:   Acute on chronic combined systolic and diastolic CHF (congestive heart failure) (HCC) F/U of OV Feb- his BNP was elevated and we increased his Lasix. He is better but admits to persistant fatigue and DOE.  Pacemaker [place] model K064 DR EL serial number [id number] Placed 11/29/2012 - Dr. Sallyanne Kuster   Essential hypertension Controlled  Non-insulin  dependent type 2 diabetes mellitus ([place]) Followed by PCP  Nonischemic cardiomyopathy ([place]) EF 40-45% in the past- will check echo   PLAN  Pacemaker checked today and pt seen by Dr Sallyanne Kuster. His pacer is working well, no AF detected, he has 8 years left on the battery. I'll check an echo and a BNP and BMP today. His wife will return to see me in a month for CHF f/u and I'll check on him then.   Kerin Ransom PA-C 07/23/2017 9:26  AM

## 2017-07-23 NOTE — Assessment & Plan Note (Signed)
EF 40-45% in the past- will check echo

## 2017-07-23 NOTE — Assessment & Plan Note (Signed)
Generator [employer] Advantio model [id number] DR EL serial number [id number] Placed 11/29/2012 - Dr. Sallyanne Kuster

## 2017-07-23 NOTE — Assessment & Plan Note (Signed)
Controlled.  

## 2017-07-23 NOTE — Assessment & Plan Note (Signed)
Followed by PCP

## 2017-07-23 NOTE — Assessment & Plan Note (Signed)
F/U of OV Feb- his BNP was elevated and we increased his Lasix. He is better but admits to persistant fatigue and DOE.

## 2017-07-23 NOTE — Patient Instructions (Signed)
Schedule  Echo   Lab today ( bmet,bnp )   Your physician recommends that you schedule a follow-up appointment with Dr.Croitoru in 3 to 4 months

## 2017-07-24 LAB — BASIC METABOLIC PANEL
BUN/Creatinine Ratio: 10 (ref 10–24)
BUN: 12 mg/dL (ref 8–27)
CO2: 26 mmol/L (ref 20–29)
Calcium: 9 mg/dL (ref 8.6–10.2)
Chloride: 102 mmol/L (ref 96–106)
Creatinine, Ser: 1.22 mg/dL (ref 0.76–1.27)
GFR calc Af Amer: 65 mL/min/{1.73_m2} (ref >59)
GFR calc non Af Amer: 56 mL/min/{1.73_m2} — ABNORMAL LOW (ref >59)
Glucose: 93 mg/dL (ref 65–99)
Potassium: 4.5 mmol/L (ref 3.5–5.2)
Sodium: 141 mmol/L (ref 134–144)

## 2017-07-24 LAB — BRAIN NATRIURETIC PEPTIDE: BNP: 454.8 pg/mL — ABNORMAL HIGH (ref 0.0–100.0)

## 2017-08-04 ENCOUNTER — Other Ambulatory Visit (HOSPITAL_COMMUNITY): Payer: Medicare HMO

## 2017-08-07 ENCOUNTER — Other Ambulatory Visit: Payer: Self-pay

## 2017-08-07 ENCOUNTER — Ambulatory Visit (HOSPITAL_COMMUNITY): Payer: Medicare HMO | Attending: Internal Medicine

## 2017-08-07 DIAGNOSIS — Z950 Presence of cardiac pacemaker: Secondary | ICD-10-CM | POA: Diagnosis not present

## 2017-08-07 DIAGNOSIS — I081 Rheumatic disorders of both mitral and tricuspid valves: Secondary | ICD-10-CM | POA: Insufficient documentation

## 2017-08-07 DIAGNOSIS — I110 Hypertensive heart disease with heart failure: Secondary | ICD-10-CM | POA: Insufficient documentation

## 2017-08-07 DIAGNOSIS — I429 Cardiomyopathy, unspecified: Secondary | ICD-10-CM | POA: Insufficient documentation

## 2017-08-07 DIAGNOSIS — I428 Other cardiomyopathies: Secondary | ICD-10-CM

## 2017-08-07 DIAGNOSIS — I10 Essential (primary) hypertension: Secondary | ICD-10-CM | POA: Diagnosis not present

## 2017-08-07 DIAGNOSIS — I441 Atrioventricular block, second degree: Secondary | ICD-10-CM | POA: Diagnosis not present

## 2017-08-07 DIAGNOSIS — I5043 Acute on chronic combined systolic (congestive) and diastolic (congestive) heart failure: Secondary | ICD-10-CM | POA: Diagnosis not present

## 2017-08-07 DIAGNOSIS — E119 Type 2 diabetes mellitus without complications: Secondary | ICD-10-CM | POA: Insufficient documentation

## 2017-08-29 DIAGNOSIS — I472 Ventricular tachycardia: Secondary | ICD-10-CM | POA: Diagnosis not present

## 2017-08-29 DIAGNOSIS — R9431 Abnormal electrocardiogram [ECG] [EKG]: Secondary | ICD-10-CM | POA: Diagnosis not present

## 2017-08-29 DIAGNOSIS — R001 Bradycardia, unspecified: Secondary | ICD-10-CM | POA: Diagnosis not present

## 2017-08-29 DIAGNOSIS — I471 Supraventricular tachycardia: Secondary | ICD-10-CM | POA: Diagnosis not present

## 2017-09-08 DIAGNOSIS — E119 Type 2 diabetes mellitus without complications: Secondary | ICD-10-CM | POA: Diagnosis not present

## 2017-09-08 DIAGNOSIS — I10 Essential (primary) hypertension: Secondary | ICD-10-CM | POA: Diagnosis not present

## 2017-09-08 DIAGNOSIS — E782 Mixed hyperlipidemia: Secondary | ICD-10-CM | POA: Diagnosis not present

## 2017-09-09 DIAGNOSIS — H04123 Dry eye syndrome of bilateral lacrimal glands: Secondary | ICD-10-CM | POA: Diagnosis not present

## 2017-09-09 DIAGNOSIS — H18413 Arcus senilis, bilateral: Secondary | ICD-10-CM | POA: Diagnosis not present

## 2017-09-09 DIAGNOSIS — H3589 Other specified retinal disorders: Secondary | ICD-10-CM | POA: Diagnosis not present

## 2017-09-09 DIAGNOSIS — H40033 Anatomical narrow angle, bilateral: Secondary | ICD-10-CM | POA: Diagnosis not present

## 2017-09-09 DIAGNOSIS — H5203 Hypermetropia, bilateral: Secondary | ICD-10-CM | POA: Diagnosis not present

## 2017-09-09 DIAGNOSIS — R6889 Other general symptoms and signs: Secondary | ICD-10-CM | POA: Diagnosis not present

## 2017-09-09 DIAGNOSIS — H40023 Open angle with borderline findings, high risk, bilateral: Secondary | ICD-10-CM | POA: Diagnosis not present

## 2017-09-09 DIAGNOSIS — H11823 Conjunctivochalasis, bilateral: Secondary | ICD-10-CM | POA: Diagnosis not present

## 2017-09-09 DIAGNOSIS — H11153 Pinguecula, bilateral: Secondary | ICD-10-CM | POA: Diagnosis not present

## 2017-09-09 DIAGNOSIS — H25013 Cortical age-related cataract, bilateral: Secondary | ICD-10-CM | POA: Diagnosis not present

## 2017-09-14 DIAGNOSIS — E118 Type 2 diabetes mellitus with unspecified complications: Secondary | ICD-10-CM | POA: Diagnosis not present

## 2017-09-14 DIAGNOSIS — I495 Sick sinus syndrome: Secondary | ICD-10-CM | POA: Diagnosis not present

## 2017-09-14 DIAGNOSIS — R6889 Other general symptoms and signs: Secondary | ICD-10-CM | POA: Diagnosis not present

## 2017-09-14 DIAGNOSIS — E1169 Type 2 diabetes mellitus with other specified complication: Secondary | ICD-10-CM | POA: Diagnosis not present

## 2017-09-14 DIAGNOSIS — E782 Mixed hyperlipidemia: Secondary | ICD-10-CM | POA: Diagnosis not present

## 2017-09-14 DIAGNOSIS — I509 Heart failure, unspecified: Secondary | ICD-10-CM | POA: Diagnosis not present

## 2017-09-14 DIAGNOSIS — I10 Essential (primary) hypertension: Secondary | ICD-10-CM | POA: Diagnosis not present

## 2017-09-14 DIAGNOSIS — I5042 Chronic combined systolic (congestive) and diastolic (congestive) heart failure: Secondary | ICD-10-CM | POA: Diagnosis not present

## 2017-10-23 ENCOUNTER — Encounter: Payer: Medicare HMO | Admitting: Cardiovascular Disease

## 2017-10-29 ENCOUNTER — Ambulatory Visit (INDEPENDENT_AMBULATORY_CARE_PROVIDER_SITE_OTHER): Payer: Medicare HMO | Admitting: Cardiovascular Disease

## 2017-10-29 ENCOUNTER — Encounter: Payer: Self-pay | Admitting: Cardiovascular Disease

## 2017-10-29 VITALS — BP 118/68 | HR 62 | Ht 71.0 in | Wt 220.0 lb

## 2017-10-29 DIAGNOSIS — Z950 Presence of cardiac pacemaker: Secondary | ICD-10-CM | POA: Diagnosis not present

## 2017-10-29 DIAGNOSIS — I480 Paroxysmal atrial fibrillation: Secondary | ICD-10-CM

## 2017-10-29 DIAGNOSIS — I5022 Chronic systolic (congestive) heart failure: Secondary | ICD-10-CM | POA: Diagnosis not present

## 2017-10-29 DIAGNOSIS — I441 Atrioventricular block, second degree: Secondary | ICD-10-CM

## 2017-10-29 DIAGNOSIS — R6889 Other general symptoms and signs: Secondary | ICD-10-CM | POA: Diagnosis not present

## 2017-10-29 DIAGNOSIS — Z79899 Other long term (current) drug therapy: Secondary | ICD-10-CM | POA: Diagnosis not present

## 2017-10-29 MED ORDER — SACUBITRIL-VALSARTAN 49-51 MG PO TABS: 1.0000 | ORAL_TABLET | Freq: Two times a day (BID) | ORAL | 3 refills | Status: DC

## 2017-10-29 MED ORDER — APIXABAN 5 MG PO TABS: 5.0000 mg | ORAL_TABLET | Freq: Two times a day (BID) | ORAL | 3 refills | Status: DC

## 2017-10-29 MED ORDER — APIXABAN 5 MG PO TABS: 5.0000 mg | ORAL_TABLET | Freq: Two times a day (BID) | ORAL | 0 refills | Status: DC

## 2017-10-29 MED ORDER — SACUBITRIL-VALSARTAN 49-51 MG PO TABS: 1.0000 | ORAL_TABLET | Freq: Two times a day (BID) | ORAL | 0 refills | Status: DC

## 2017-10-29 NOTE — Progress Notes (Signed)
Patient ID: Ricky Barnett, male   DOB: Jul 15, 1940, 77 y.o.   MRN: [id number]      Cardiology Office Note   Date:  10/31/2017   ID:  Ricky Barnett, DOB 06-Sep-1940, MRN [id number]  PCP:  Merrilee Seashore, MD  Cardiologist:   Sanda Klein, MD   No chief complaint on file.     History of Present Illness: Ricky Barnett is a 77 y.o. male who presents for  Pacemaker follow-up and chronic systolic heart failure.  He has recently moved back to [place] from [place].  He has NYHA functional class II exertional dyspnea, for example climbing 1 flight of stairs.  He does not have any problems with leg edema, orthopnea or PND.  Does not have palpitations, dizziness, syncope or angina.  He presented in 2014 with symptomatic bradycardia secondary to second-degree atrioventricular block, Mobitz type I. He received a dual-chamber Engineer, site pacemaker.  Just before receiving the pacemaker, he underwent a stress myocardial perfusion study that showed normal findings and an ejection fraction of 56%. An echocardiogram that was performed after pacemaker implantation, January 2015 showed mildly reduced EF of 45-50% but was otherwise normal study.  Previously, he has not had symptoms of congestive heart failure.  A follow-up echocardiogram that was performed this year shows moderate left ventricular dilation (reported as normal but the end-systolic and end-diastolic diameters are 16.6 and 63.3 mm respectively).  The ejection fraction is now estimated to be 35-40% with global hypokinesis and moderate LVH.  The Doppler parameters confirmed elevated filling pressures.  The left atrium is moderately dilated (although the end-systolic diameter is actually in the severely dilated range at 5.2 cm.  The estimated systolic pulmonary pressure was markedly elevated at 66 mmHg.  Pacemaker interrogation shows excellent lead parameters, unchanged since device implantation.  Generator longevity is estimated  to be 8 years.  His device has reported a couple of very brief episodes of nonsustained VT at around 170-190 bpm both of them extremely brief.  His device is also recorded a couple of episodes of "pacemaker mediated tachycardia which on review are actually episodes of atrial flutter with predominantly 3:1 and 4:1 AV conduction and flutter waves in the refractory period.  The device did not recognize these events as being atrial arrhythmia and does not provide appropriate notes.  The longest recorded episode of atrial tachycardia around July 1 lasted for about 3 hours (electrograms are not available but suspect atrial flutter or atrial fibrillation).  The heart rate histogram distribution is appropriate.  He has 48% atrial pacing and 90% ventricular pacing.   Past Medical History:  Diagnosis Date  . Angina    just started recently ,2-3 days ago-went to see Dr. Einar Gip 05/14/2011  . Arthritis    bil knees  . Diabetes mellitus    was at one time lost weight amd no problem with it now. About 2-3 yrs ago  . Dysrhythmia    2-3 yrs ago  . Headache(784.0)    occas  . Hypertension   . Mastoiditis    right ear  . Pacemaker 11/29/2012   dual chamber  . Palpitations     Past Surgical History:  Procedure Laterality Date  . CYSTECTOMY     under chin 1983  . FINGER SURGERY     states he cut finger off when cutting wood  . INSERT / REPLACE / REMOVE PACEMAKER  11/29/2012   [employer]   . NASAL SINUS SURGERY  left side  . PERMANENT PACEMAKER INSERTION N/A 11/29/2012   Procedure: PERMANENT PACEMAKER INSERTION;  Surgeon: Sanda Klein, MD;  Location: [facility] CATH LAB;  Service: Cardiovascular;  Laterality: N/A;  . TYMPANOPLASTY  05/19/2011   Procedure: TYMPANOPLASTY;  Surgeon: Thornell Sartorius, MD;  Location: [facility] OR;  Service: ENT;  Laterality: Right;  Exploratory Tympanotomy     Current Outpatient Medications  Medication Sig Dispense Refill  . acetaminophen (TYLENOL) 325 MG tablet Take 1-2  tablets (325-650 mg total) by mouth every 4 (four) hours as needed.    . furosemide (LASIX) 40 MG tablet Take 1 tablet (40 mg total) by mouth daily. 90 tablet 3  . metFORMIN (GLUCOPHAGE-XR) 750 MG 24 hr tablet Take 750 mg by mouth 2 (two) times daily.     . metoprolol succinate (TOPROL-XL) 50 MG 24 hr tablet Take 50 mg by mouth 2 (two) times daily. Take with or immediately following a meal.    . Multiple Vitamin (MULTIVITAMIN) tablet Take 1 tablet by mouth daily.    . potassium chloride SA (K-DUR,KLOR-CON) 20 MEQ tablet Take 1 tablet (20 mEq total) by mouth daily. 90 tablet 3  . pravastatin (PRAVACHOL) 40 MG tablet Take 1 tablet (40 mg total) by mouth daily. 90 tablet 2  . Tamsulosin HCl (FLOMAX) 0.4 MG CAPS Take 0.4 mg by mouth daily after supper.    Marland Kitchen apixaban (ELIQUIS) 5 MG TABS tablet Take 1 tablet (5 mg total) by mouth 2 (two) times daily. 60 tablet 0  . sacubitril-valsartan (ENTRESTO) 49-51 MG Take 1 tablet by mouth 2 (two) times daily. 60 tablet 0   No current facility-administered medications for this visit.     Allergies:   Patient has no known allergies.    Social History:  The patient  reports that he has never smoked. He has never used smokeless tobacco. He reports that he does not drink alcohol or use drugs.   Family History:  The patient's family history includes Heart attack in his father.    ROS:  Please see the history of present illness.    Otherwise, review of systems positive for none.   All other systems are reviewed and negative.    PHYSICAL EXAM: VS:  BP 118/68   Pulse 62   Ht 5\' 11"  (1.803 m)   Wt 220 lb (99.8 kg)   BMI 30.68 kg/m  , BMI Body mass index is 30.68 kg/m.   General: Alert, oriented x3, no distress, appears comfortable Head: no evidence of trauma, PERRL, EOMI, no exophtalmos or lid lag, no myxedema, no xanthelasma; normal ears, nose and oropharynx Neck: normal jugular venous pulsations and no hepatojugular reflux; brisk carotid pulses without  delay and no carotid bruits Chest: clear to auscultation, no signs of consolidation by percussion or palpation, normal fremitus, symmetrical and full respiratory excursions Cardiovascular: normal position and quality of the apical impulse, regular rhythm, normal first and paradoxically split but not intensified second heart sounds, no murmurs, rubs or gallops Abdomen: no tenderness or distention, no masses by palpation, no abnormal pulsatility or arterial bruits, normal bowel sounds, no hepatosplenomegaly Extremities: no clubbing, cyanosis or edema; 2+ radial, ulnar and brachial pulses bilaterally; 2+ right femoral, posterior tibial and dorsalis pedis pulses; 2+ left femoral, posterior tibial and dorsalis pedis pulses; no subclavian or femoral bruits Neurological: grossly nonfocal Psych: Normal mood and affect   EKG:  EKG is not ordered today.  The intracardiac electrogram shows atrial sensed ventricular paced rhythm  Recent Labs: 04/23/2017: Hemoglobin  12.5; Platelets 287 07/23/2017: BNP 454.8; BUN 12; Creatinine, Ser 1.22; Potassium 4.5; Sodium 141    Lipid Panel No results found for: CHOL, TRIG, HDL, CHOLHDL, VLDL, LDLCALC, LDLDIRECT    Wt Readings from Last 3 Encounters:  10/29/17 220 lb (99.8 kg)  07/23/17 219 lb 3.2 oz (99.4 kg)  04/23/17 228 lb 3.2 oz (103.5 kg)   .   ASSESSMENT AND PLAN:  1.  CHF: He has moderately decreased left ventricular systolic function and has developed left ventricular dilation EF is slightly lower than it was 3 years ago.  Possible that the LV function has deteriorated due to increased prevalence of RV pacing with progression and heart block.  Try to optimize heart failure medications, consider for referral for CRT-P if EF does not improve. He is already on beta-blockers but would benefit from [place].  Stop his amlodipine-benazepril, pointed out repeatedly that he needs a 6-hour (in practice 2 days) washout period.  After that he will start Entresto at  the 49-51 mg dose.  We will bring him back to our clinical pharmacist discussed titration to the maximum dose of Entresto.  I gave him a 30-day coupon but told him not to use it until we have decided on the final dose of Entresto.  Repeat his echocardiogram in 3 months. 2. PAFib: His pacemaker shows clear evidence of paroxysmal atrial flutter, possibly paroxysmal atrial fibrillation the episodes are not frequent and the overall burden is low, but one episode lasted for 3 hours we discussed the risk of embolic stroke.  Also reviewed the bleeding complications of counterbalance the benefit, but I think in this case the risk benefit ratio is clearly in favor of anticoagulation.  CHADSVasc score is 4 (age 76, heart failure, hypertension).  Started Eliquis 5 mg twice daily and gave him a coupon for this as well 3.  Second-degree AV block Mobitz type I with symptomatic bradycardia and normally functioning dual-chamber permanent pacemaker.  Beta-blockers will be beneficial for heart failure, but obviously can worsen AV blockade.  Consider switching to carvedilol for better heart failure performance. 4.  Pacemaker: Normal device function.  Remote downloads every 3 months and yearly office visit   Patient Instructions  Medication Instructions: Dr Sallyanne Kuster has recommended making the following medication changes: 1. STOP Amlodipine-Benazepril 2. START Entresto 49/51 mg - take 1 tablet by mouth twice daily 3. START Eliquis 5 mg - take 1 tablet by mouth twice daily  Labwork: Your physician recommends that you return for lab work in 2 weeks.  Testing/Procedures: 1. Echocardiogram - Your physician has requested that you have an echocardiogram. Echocardiography is a painless test that uses sound waves to create images of your heart. It provides your doctor with information about the size and shape of your heart and how well your heart's chambers and valves are working. This procedure takes approximately one hour.  There are no restrictions for this procedure.  >> This will be performed at our [facility] location [place], [place] [address] [phone]  Follow-up: Your physician recommends that you schedule an appointment with one of our clinical pharmacists in 2 weeks for medication management.  Dr Sallyanne Kuster recommends that you schedule a follow-up appointment in 3 months on a device clinic day.  If you need a refill on your cardiac medications before your next appointment, please call your pharmacy.   Labs/ tests ordered today include:   Orders Placed This Encounter  Procedures  . Basic metabolic panel  .  ECHOCARDIOGRAM COMPLETE    Patient Instructions  Medication Instructions: Dr Sallyanne Kuster has recommended making the following medication changes: 1. STOP Amlodipine-Benazepril 2. START Entresto 49/51 mg - take 1 tablet by mouth twice daily 3. START Eliquis 5 mg - take 1 tablet by mouth twice daily  Labwork: Your physician recommends that you return for lab work in 2 weeks.  Testing/Procedures: 1. Echocardiogram - Your physician has requested that you have an echocardiogram. Echocardiography is a painless test that uses sound waves to create images of your heart. It provides your doctor with information about the size and shape of your heart and how well your heart's chambers and valves are working. This procedure takes approximately one hour. There are no restrictions for this procedure.  >> This will be performed at our [facility] location [place], [place] [address] [phone]  Follow-up: Your physician recommends that you schedule an appointment with one of our clinical pharmacists in 2 weeks for medication management.  Dr Sallyanne Kuster recommends that you schedule a follow-up appointment in 3 months on a device clinic day.  If you need a refill on your cardiac medications before your next appointment, please call your  pharmacy.     Signed, Sanda Klein, MD  10/31/2017 3:29 PM    Sanda Klein, MD, [facility] [phone] office [phone] pager

## 2017-10-29 NOTE — Patient Instructions (Signed)
Medication Instructions: Dr Sallyanne Kuster has recommended making the following medication changes: 1. STOP Amlodipine-Benazepril 2. START Entresto 49/51 mg - take 1 tablet by mouth twice daily 3. START Eliquis 5 mg - take 1 tablet by mouth twice daily  Labwork: Your physician recommends that you return for lab work in 2 weeks.  Testing/Procedures: 1. Echocardiogram - Your physician has requested that you have an echocardiogram. Echocardiography is a painless test that uses sound waves to create images of your heart. It provides your doctor with information about the size and shape of your heart and how well your heart's chambers and valves are working. This procedure takes approximately one hour. There are no restrictions for this procedure.  >> This will be performed at our [facility] location [place], [place] [address] [phone]  Follow-up: Your physician recommends that you schedule an appointment with one of our clinical pharmacists in 2 weeks for medication management.  Dr Sallyanne Kuster recommends that you schedule a follow-up appointment in 3 months on a device clinic day.  If you need a refill on your cardiac medications before your next appointment, please call your pharmacy.

## 2017-10-31 DIAGNOSIS — I480 Paroxysmal atrial fibrillation: Secondary | ICD-10-CM | POA: Insufficient documentation

## 2017-11-03 ENCOUNTER — Ambulatory Visit (HOSPITAL_COMMUNITY): Payer: Medicare HMO | Attending: Cardiovascular Disease

## 2017-11-12 ENCOUNTER — Ambulatory Visit: Payer: Medicare HMO

## 2017-11-12 NOTE — Progress Notes (Deleted)
Patient ID: Ricky Barnett                 DOB: Dec 20, 1940                      MRN: [id number]     HPI: Ricky Barnett is a 77 y.o. male referred by Ricky. Sallyanne Barnett to pharmacist clinic for medication titration. PMH includes Afib, hypertension, HF with EF 35% on 08/07/2017, Mobitz 1, and diabetes.  Entresto 49/51mg  was initiated during most recent OV with Ricky Barnett. Patient presents today for   Current HTN meds:  Furosemide 40mg  daily Metoprolol XR 750mg  daily Entresto 49-51mg  twice daily  BP goal: <130/80  Family History:   Social History:   Diet:   Exercise:   Home BP readings:   Wt Readings from Last 3 Encounters:  10/29/17 220 lb (99.8 kg)  07/23/17 219 lb 3.2 oz (99.4 kg)  04/23/17 228 lb 3.2 oz (103.5 kg)   BP Readings from Last 3 Encounters:  10/29/17 118/68  07/23/17 110/72  04/23/17 (!) 141/90   Pulse Readings from Last 3 Encounters:  10/29/17 62  07/23/17 76  04/23/17 76    Renal function: CrCl cannot be calculated (Patient's most recent lab result is older than the maximum 21 days allowed.).  Past Medical History:  Diagnosis Date  . Angina    just started recently ,2-3 days ago-went to see Ricky Barnett 05/14/2011  . Arthritis    bil knees  . Diabetes mellitus    was at one time lost weight amd no problem with it now. About 2-3 yrs ago  . Dysrhythmia    2-3 yrs ago  . Headache(784.0)    occas  . Hypertension   . Mastoiditis    right ear  . Pacemaker 11/29/2012   dual chamber  . Palpitations     Current Outpatient Medications on File Prior to Visit  Medication Sig Dispense Refill  . acetaminophen (TYLENOL) 325 MG tablet Take 1-2 tablets (325-650 mg total) by mouth every 4 (four) hours as needed.    Marland Kitchen apixaban (ELIQUIS) 5 MG TABS tablet Take 1 tablet (5 mg total) by mouth 2 (two) times daily. 60 tablet 0  . furosemide (LASIX) 40 MG tablet Take 1 tablet (40 mg total) by mouth daily. 90 tablet 3  . metFORMIN (GLUCOPHAGE-XR) 750 MG 24 hr tablet Take 750  mg by mouth 2 (two) times daily.     . metoprolol succinate (TOPROL-XL) 50 MG 24 hr tablet Take 50 mg by mouth 2 (two) times daily. Take with or immediately following a meal.    . Multiple Vitamin (MULTIVITAMIN) tablet Take 1 tablet by mouth daily.    . potassium chloride SA (K-DUR,KLOR-CON) 20 MEQ tablet Take 1 tablet (20 mEq total) by mouth daily. 90 tablet 3  . pravastatin (PRAVACHOL) 40 MG tablet Take 1 tablet (40 mg total) by mouth daily. 90 tablet 2  . sacubitril-valsartan (ENTRESTO) 49-51 MG Take 1 tablet by mouth 2 (two) times daily. 60 tablet 0  . Tamsulosin HCl (FLOMAX) 0.4 MG CAPS Take 0.4 mg by mouth daily after supper.     No current facility-administered medications on file prior to visit.     No Known Allergies  There were no vitals taken for this visit.  No problem-specific Assessment & Plan notes found for this encounter.   Ricky Barnett PharmD, BCPS, [facility] [address] [place] [address] 11/12/2017 2:14 PM

## 2017-11-16 ENCOUNTER — Telehealth: Payer: Self-pay | Admitting: Cardiovascular Disease

## 2017-11-16 MED ORDER — SACUBITRIL-VALSARTAN 49-51 MG PO TABS: 1.0000 | ORAL_TABLET | Freq: Two times a day (BID) | ORAL | 2 refills | Status: DC

## 2017-11-16 NOTE — Telephone Encounter (Signed)
New Message   Pt states he ran out or his Entresto last night   *STAT* If patient is at the pharmacy, call can be transferred to refill team.   1. Which medications need to be refilled? (please list name of each medication and dose if known) sacubitril-valsartan (ENTRESTO) 49-51 MG  2. Which pharmacy/location (including street and city if local pharmacy) is medication to be sent to? [id number] - , [place] - [address]  3. Do they need a 30 day or 90 day supply? [place]

## 2017-11-17 ENCOUNTER — Ambulatory Visit (HOSPITAL_COMMUNITY): Payer: Medicare HMO | Attending: Cardiology

## 2017-11-17 ENCOUNTER — Other Ambulatory Visit: Payer: Self-pay

## 2017-11-17 DIAGNOSIS — I272 Pulmonary hypertension, unspecified: Secondary | ICD-10-CM | POA: Insufficient documentation

## 2017-11-17 DIAGNOSIS — I5022 Chronic systolic (congestive) heart failure: Secondary | ICD-10-CM | POA: Diagnosis not present

## 2017-11-17 DIAGNOSIS — I083 Combined rheumatic disorders of mitral, aortic and tricuspid valves: Secondary | ICD-10-CM | POA: Insufficient documentation

## 2017-11-17 DIAGNOSIS — I110 Hypertensive heart disease with heart failure: Secondary | ICD-10-CM | POA: Insufficient documentation

## 2017-11-17 DIAGNOSIS — I4891 Unspecified atrial fibrillation: Secondary | ICD-10-CM | POA: Diagnosis not present

## 2017-11-17 DIAGNOSIS — E119 Type 2 diabetes mellitus without complications: Secondary | ICD-10-CM | POA: Diagnosis not present

## 2017-11-23 ENCOUNTER — Telehealth: Payer: Self-pay | Admitting: Cardiovascular Disease

## 2017-11-23 ENCOUNTER — Other Ambulatory Visit: Payer: Self-pay | Admitting: *Deleted

## 2017-11-23 DIAGNOSIS — Z01812 Encounter for preprocedural laboratory examination: Secondary | ICD-10-CM

## 2017-11-23 DIAGNOSIS — I428 Other cardiomyopathies: Secondary | ICD-10-CM

## 2017-11-23 MED ORDER — SACUBITRIL-VALSARTAN 49-51 MG PO TABS: 1.0000 | ORAL_TABLET | Freq: Two times a day (BID) | ORAL | 2 refills | Status: DC

## 2017-11-23 NOTE — Telephone Encounter (Signed)
New Message    Patient has been sick the entire weekend, with two knots on his side, vomiting, dizziness, eye trouble with eyes. Out of medication about a week.          *STAT* If patient is at the pharmacy, call can be transferred to refill team.   1. Which medications need to be refilled? (please list name of each medication and dose if known) Entresto  2. Which pharmacy/location (including street and city if local pharmacy) is medication to be sent to? [id number] - [place], [place] - [address]  3. Do they need a 30 day or 90 day supply? Ricky Barnett

## 2017-11-23 NOTE — Telephone Encounter (Signed)
Spoke with pt. Apologized that the last refill rqst for Entresto did not go through. Sent in another refill to pt pharmacy.  Adv pt to f/u with his pcp. Pt sts that he has been having dizziness, nausea, and trouble with his eyes (He will need cataract sx soon) He denies sob, orthopnea, palpitations, weight gain, chest pain. Pt sts that he will call his pcp today.

## 2017-11-26 ENCOUNTER — Encounter: Payer: Self-pay | Admitting: Internal Medicine

## 2017-11-26 ENCOUNTER — Ambulatory Visit (INDEPENDENT_AMBULATORY_CARE_PROVIDER_SITE_OTHER): Payer: Medicare HMO | Admitting: Internal Medicine

## 2017-11-26 ENCOUNTER — Ambulatory Visit: Payer: Medicare HMO

## 2017-11-26 VITALS — BP 134/80 | HR 81 | Ht 71.0 in | Wt 230.0 lb

## 2017-11-26 DIAGNOSIS — I441 Atrioventricular block, second degree: Secondary | ICD-10-CM | POA: Diagnosis not present

## 2017-11-26 DIAGNOSIS — I480 Paroxysmal atrial fibrillation: Secondary | ICD-10-CM

## 2017-11-26 DIAGNOSIS — I5022 Chronic systolic (congestive) heart failure: Secondary | ICD-10-CM

## 2017-11-26 DIAGNOSIS — Z950 Presence of cardiac pacemaker: Secondary | ICD-10-CM

## 2017-11-26 NOTE — H&P (View-Only) (Signed)
HPI Ricky Barnett is referred today by Dr. Sallyanne Kuster for consideration of a biv upgrade. He is a pleasant 77 yo man with a h/o chronic systolic heart failure, LV dysfunction, s/p PPM insertion. He has developed worsening sob and was found to have an EF of 35%. He has pacing induced LBBB with a QRS duration of over 160 ms. He has not had syncope or sustained VT. No Known Allergies   Current Outpatient Medications  Medication Sig Dispense Refill  . acetaminophen (TYLENOL) 325 MG tablet Take 1-2 tablets (325-650 mg total) by mouth every 4 (four) hours as needed.    Marland Kitchen apixaban (ELIQUIS) 5 MG TABS tablet Take 1 tablet (5 mg total) by mouth 2 (two) times daily. 60 tablet 0  . metFORMIN (GLUCOPHAGE-XR) 750 MG 24 hr tablet Take 750 mg by mouth 2 (two) times daily.     . metoprolol succinate (TOPROL-XL) 50 MG 24 hr tablet Take 50 mg by mouth 2 (two) times daily. Take with or immediately following a meal.    . Multiple Vitamin (MULTIVITAMIN) tablet Take 1 tablet by mouth daily.    . pravastatin (PRAVACHOL) 40 MG tablet Take 1 tablet (40 mg total) by mouth daily. 90 tablet 2  . sacubitril-valsartan (ENTRESTO) 49-51 MG Take 1 tablet by mouth 2 (two) times daily. 180 tablet 2  . Tamsulosin HCl (FLOMAX) 0.4 MG CAPS Take 0.4 mg by mouth daily after supper.    . furosemide (LASIX) 40 MG tablet Take 1 tablet (40 mg total) by mouth daily. 90 tablet 3  . potassium chloride SA (K-DUR,KLOR-CON) 20 MEQ tablet Take 1 tablet (20 mEq total) by mouth daily. 90 tablet 3   No current facility-administered medications for this visit.      Past Medical History:  Diagnosis Date  . Angina    just started recently ,2-3 days ago-went to see Dr. Einar Gip 05/14/2011  . Arthritis    bil knees  . Diabetes mellitus    was at one time lost weight amd no problem with it now. About 2-3 yrs ago  . Dysrhythmia    2-3 yrs ago  . Headache(784.0)    occas  . Hypertension   . Mastoiditis    right ear  . Pacemaker 11/29/2012   dual chamber  . Palpitations     ROS:   All systems reviewed and negative except as noted in the HPI.   Past Surgical History:  Procedure Laterality Date  . CYSTECTOMY     under chin 1983  . FINGER SURGERY     states he cut finger off when cutting wood  . INSERT / REPLACE / REMOVE PACEMAKER  11/29/2012   [employer]   . NASAL SINUS SURGERY     left side  . PERMANENT PACEMAKER INSERTION N/A 11/29/2012   Procedure: PERMANENT PACEMAKER INSERTION;  Surgeon: Sanda Klein, MD;  Location: [facility]ATH [facility];  Service: Cardiovascular;  Laterality: N/A;  . TYMPANOPLASTY  05/19/2011   Procedure: TYMPANOPLASTY;  Surgeon: Thornell Sartorius, MD;  Location: [facility] OR;  Service: ENT;  Laterality: Right;  Exploratory Tympanotomy     Family History  Problem Relation Age of Onset  . Heart attack Father      Social History   Socioeconomic History  . Marital status: Married    Spouse name: Not on file  . Number of children: Not on file  . Years of education: Not on file  . Highest education level: Not on file  Occupational  History  . Not on file  Social Needs  . Financial resource strain: Not on file  . Food insecurity:    Worry: Not on file    Inability: Not on file  . Transportation needs:    Medical: Not on file    Non-medical: Not on file  Tobacco Use  . Smoking status: Never Smoker  . Smokeless tobacco: Never Used  Substance and Sexual Activity  . Alcohol use: No  . Drug use: No  . Sexual activity: Not on file  Lifestyle  . Physical activity:    Days per week: Not on file    Minutes per session: Not on file  . Stress: Not on file  Relationships  . Social connections:    Talks on phone: Not on file    Gets together: Not on file    Attends religious service: Not on file    Active member of club or organization: Not on file    Attends meetings of clubs or organizations: Not on file    Relationship status: Not on file  . Intimate partner violence:    Fear of current or  ex partner: Not on file    Emotionally abused: Not on file    Physically abused: Not on file    Forced sexual activity: Not on file  Other Topics Concern  . Not on file  Social History Narrative  . Not on file     BP 134/80   Pulse 81   Ht 5\' 11"  (1.803 m)   Wt 230 lb (104.3 kg)   SpO2 97%   BMI 32.08 kg/m   Physical Exam:  Well appearing NAD HEENT: Unremarkable Neck:  No JVD, no thyromegally Lymphatics:  No adenopathy Back:  No CVA tenderness Lungs:  Clear HEART:  Regular rate rhythm, no murmurs, no rubs, no clicks Abd:  soft, positive bowel sounds, no organomegally, no rebound, no guarding Ext:  2 plus pulses, no edema, no cyanosis, no clubbing Skin:  No rashes no nodules Neuro:  CN II through XII intact, motor grossly intact  EKG - NSR with pacing induced LBBB  DEVICE  Normal device function.  See PaceArt for details.   Assess/Plan: 1. Chronic systolic heart failure - his EF has worsened by echo. He will undergo biv upgrade. I have discussed the indications/risks/benefits/goals/expectations and he wishes to proceed. 2. PPM - his device is working normally. We will recheck in several months. 3. HTN - his blood pressure is fairly well controlled. We will follow.  Mikle Bosworth.D.

## 2017-11-26 NOTE — Progress Notes (Deleted)
Patient ID: [name]                 DOB: [birth year]                      MRN: [id number]      HPI: Ricky Barnett is a 77 y.o. male referred by Ricky. Sallyanne Barnett to pharmacist clinic for HF medication titration.  Entresto 49/51mg  twice daily was initiated by Ricky Barnett on 10/29/17. Patient was due to repeat blood work and f/u with pharmacist 2 weeks after initiating new medication but patient cancelled the office visit. Noticed no repeat BMET done either.    Current HTN meds:  Furosemide 40mg  daily Metoprolol 50mg  daily Entresto 49-51mg  twice daily  BP goal: <130/80  Family History:   Social History:   Diet:   Exercise:   Home BP readings:   Wt Readings from Last 3 Encounters:  10/29/17 220 lb (99.8 kg)  07/23/17 219 lb 3.2 oz (99.4 kg)  04/23/17 228 lb 3.2 oz (103.5 kg)   BP Readings from Last 3 Encounters:  10/29/17 118/68  07/23/17 110/72  04/23/17 (!) 141/90   Pulse Readings from Last 3 Encounters:  10/29/17 62  07/23/17 76  04/23/17 76    Renal function: CrCl cannot be calculated (Patient's most recent lab result is older than the maximum 21 days allowed.).  Past Medical History:  Diagnosis Date  . Angina    just started recently ,2-3 days ago-went to see Ricky. Einar Barnett 05/14/2011  . Arthritis    bil knees  . Diabetes mellitus    was at one time lost weight amd no problem with it now. About 2-3 yrs ago  . Dysrhythmia    2-3 yrs ago  . Headache(784.0)    occas  . Hypertension   . Mastoiditis    right ear  . Pacemaker 11/29/2012   dual chamber  . Palpitations     Current Outpatient Medications on File Prior to Visit  Medication Sig Dispense Refill  . acetaminophen (TYLENOL) 325 MG tablet Take 1-2 tablets (325-650 mg total) by mouth every 4 (four) hours as needed.    Marland Kitchen apixaban (ELIQUIS) 5 MG TABS tablet Take 1 tablet (5 mg total) by mouth 2 (two) times daily. 60 tablet 0  . furosemide (LASIX) 40 MG tablet Take 1 tablet (40 mg total) by mouth daily. 90  tablet 3  . metFORMIN (GLUCOPHAGE-XR) 750 MG 24 hr tablet Take 750 mg by mouth 2 (two) times daily.     . metoprolol succinate (TOPROL-XL) 50 MG 24 hr tablet Take 50 mg by mouth 2 (two) times daily. Take with or immediately following a meal.    . Multiple Vitamin (MULTIVITAMIN) tablet Take 1 tablet by mouth daily.    . potassium chloride SA (K-DUR,KLOR-CON) 20 MEQ tablet Take 1 tablet (20 mEq total) by mouth daily. 90 tablet 3  . pravastatin (PRAVACHOL) 40 MG tablet Take 1 tablet (40 mg total) by mouth daily. 90 tablet 2  . sacubitril-valsartan (ENTRESTO) 49-51 MG Take 1 tablet by mouth 2 (two) times daily. 180 tablet 2  . Tamsulosin HCl (FLOMAX) 0.4 MG CAPS Take 0.4 mg by mouth daily after supper.     No current facility-administered medications on file prior to visit.     No Known Allergies  There were no vitals taken for this visit.  No problem-specific Assessment & Plan notes found for this encounter.   Ricky Barnett PharmD, BCPS, CPP [facility] [place] [address] 11/26/2017 8:15 AM

## 2017-11-26 NOTE — Patient Instructions (Addendum)
Medication Instructions:  Your physician recommends that you continue on your current medications as directed. Please refer to the Current Medication list given to you today.  Labwork: You will get lab work today:  BMP and CBC.  Testing/Procedures: Your physician has recommended that you have a pacemaker upgraded to a biventricular pacemaker.  Follow-Up: You will follow up with device clinic 10-14 days after your procedure for a wound check.  You will follow up with Dr. Lovena Le 91 days after your procedure.  INSTRUCTIONS FOR YOUR PROCEDURE:  Please arrive at the [facility] main entrance of [facility] at:  9:30 am on December 04, 2017 Use the CHG surgical scrub as directed Do not eat or drink after midnight prior to procedure DO NOT [place] 2 DAYS PRIOR TO YOUR PROCEDURE.  YOUR LAST DOSE WILL BE December 01, 2017 YOUR EVENING DOSE. On the morning of your procedure take your ENTRESTO ONLY with a sip of water. Plan for one night stay. You will need someone to drive you home at discharge.  If you need a refill on your cardiac medications before your next appointment, please call your pharmacy.   Biventricular Pacemaker Implantation A biventricular pacemaker implantation is a procedure to place (implant) a pacemaker into both of the lower chambers (ventricles) of the heart. A pacemaker is a small, battery-powered device that helps control the heartbeat. If the heart beats irregularly or too slowly (bradycardia), the pacemaker will pace the heart so that it beats at a normal rate or a programmed rate. The parts of a biventricular pacemaker include:  The pulse generator. The pulse generator contains a small computer and a memory system that is programmed to keep the heart beating at a certain rate. The pulse generator also produces the electrical signal that triggers the heart to beat. This is implanted under the skin of the upper chest, near the collarbone.  Wires  (leads). The leads are placed in the left and right ventricles of the heart. The leads are connected to the pulse generator. They transmit electrical pulses from the pulse generator to the heart.  This procedure may be done to treat:  Bradycardia.  Symptoms of severe heart failure, such as shortness of breath (dyspnea).  Loss of consciousness that happens repeatedly (syncope) because of an irregular heart rate.  Tell a health care provider about:  Any allergies you have.  All medicines you are taking, including vitamins, herbs, eye drops, creams, and over-the-counter medicines.  Any problems you or family members have had with anesthetic medicines.  Any blood disorders you have.  Any surgeries you have had.  Any medical conditions you have.  Whether you are pregnant or may be pregnant. What are the risks? Generally, this is a safe procedure. However, problems may occur, including:  Infection.  Bleeding.  Allergic reactions to medicines or dyes.  Damage to other structures or organs, such as your blood vessels, lungs, or heart.  Failure of the pacemaker to improve your condition.  What happens before the procedure?  Ask your health care provider about: ? Changing or stopping your regular medicines. This is especially important if you are taking diabetes medicines or blood thinners. ? Taking medicines such as aspirin and ibuprofen. These medicines can thin your blood. Do not take these medicines before your procedure if your health care provider instructs you not to.  Follow instructions from your health care provider about eating or drinking restrictions.  Do not use any tobacco products for  at least 24 hours before your procedure. This includes cigarettes, chewing tobacco, or e-cigarettes.  Ask your health care provider how your surgical site will be marked or identified.  You may be given antibiotic medicine to help prevent infection.  You may have tests,  including: ? Blood tests. ? Chest X-rays.  Plan to have someone take you home after the procedure.  If you go home right after the procedure, plan to have someone with you for 24 hours. What happens during the procedure?  To reduce your risk of infection: ? Your health care team will wash or sanitize their hands. ? Your skin will be washed with soap. ? Hair may be removed from your surgical area.  An IV tube will be inserted into one of your veins.  You will be given one or more of the following: ? A medicine to help you relax (sedative). ? A medicine to make you fall asleep (general anesthetic). ? A medicine that is injected into your spine to numb the area below and slightly above the injection site (spinal anesthetic). ? A medicine that is injected into an area of your body to numb everything below the injection site (regional anesthetic).  An incision will be made in your upper chest, near your heart.  The leads will be guided into your incision, through your blood vessels, and into your ventricles. Your surgeon will use an X-ray machine (fluoroscope) to guide the leads into your heart.  The leads will be attached to your heart muscles and to the pulse generator.  The leads will be tested to make sure that they work correctly.  The pulse generator will be implanted under your skin, near your incision.  Your incision will be closed with stitches (sutures), skin glue, or adhesive tape.  A bandage (dressing) will be placed over your incision. The procedure may vary among health care providers and hospitals. What happens after the procedure?  Your blood pressure, heart rate, breathing rate, and blood oxygen level will be monitored often until the medicines you were given have worn off.  You may continue to receive fluids and medicines through an IV tube.  You will have some pain. Pain medicines will be available to help you.  You will have a chest X-ray done. This is to  make sure that your pacemaker is in the right place.  You may have to wear compression stockings. These stockings help to prevent blood clots and reduce swelling in your legs.  You will be given a pacemaker identification card. This card lists the implant date, device model, and manufacturer of your pacemaker.  Do not drive for 24 hours if you received a sedative. This information is not intended to replace advice given to you by your health care provider. Make sure you discuss any questions you have with your health care provider. Document Released: 11/12/2011 Document Revised: 07/26/2015 Document Reviewed: 11/12/2014 Elsevier Interactive Patient Education  [employer].

## 2017-11-26 NOTE — Progress Notes (Signed)
HPI Ricky Barnett is referred today by Dr. Sallyanne Kuster for consideration of a biv upgrade. He is a pleasant 77 yo man with a h/o chronic systolic heart failure, LV dysfunction, s/p PPM insertion. He has developed worsening sob and was found to have an EF of 35%. He has pacing induced LBBB with a QRS duration of over 160 ms. He has not had syncope or sustained VT. No Known Allergies   Current Outpatient Medications  Medication Sig Dispense Refill  . acetaminophen (TYLENOL) 325 MG tablet Take 1-2 tablets (325-650 mg total) by mouth every 4 (four) hours as needed.    Marland Kitchen apixaban (ELIQUIS) 5 MG TABS tablet Take 1 tablet (5 mg total) by mouth 2 (two) times daily. 60 tablet 0  . metFORMIN (GLUCOPHAGE-XR) 750 MG 24 hr tablet Take 750 mg by mouth 2 (two) times daily.     . metoprolol succinate (TOPROL-XL) 50 MG 24 hr tablet Take 50 mg by mouth 2 (two) times daily. Take with or immediately following a meal.    . Multiple Vitamin (MULTIVITAMIN) tablet Take 1 tablet by mouth daily.    . pravastatin (PRAVACHOL) 40 MG tablet Take 1 tablet (40 mg total) by mouth daily. 90 tablet 2  . sacubitril-valsartan (ENTRESTO) 49-51 MG Take 1 tablet by mouth 2 (two) times daily. 180 tablet 2  . Tamsulosin HCl (FLOMAX) 0.4 MG CAPS Take 0.4 mg by mouth daily after supper.    . furosemide (LASIX) 40 MG tablet Take 1 tablet (40 mg total) by mouth daily. 90 tablet 3  . potassium chloride SA (K-DUR,KLOR-CON) 20 MEQ tablet Take 1 tablet (20 mEq total) by mouth daily. 90 tablet 3   No current facility-administered medications for this visit.      Past Medical History:  Diagnosis Date  . Angina    just started recently ,2-3 days ago-went to see Dr. Einar Gip 05/14/2011  . Arthritis    bil knees  . Diabetes mellitus    was at one time lost weight amd no problem with it now. About 2-3 yrs ago  . Dysrhythmia    2-3 yrs ago  . Headache(784.0)    occas  . Hypertension   . Mastoiditis    right ear  . Pacemaker 11/29/2012   dual chamber  . Palpitations     ROS:   All systems reviewed and negative except as noted in the HPI.   Past Surgical History:  Procedure Laterality Date  . CYSTECTOMY     under chin 1983  . FINGER SURGERY     states he cut finger off when cutting wood  . INSERT / REPLACE / REMOVE PACEMAKER  11/29/2012   [employer]   . NASAL SINUS SURGERY     left side  . PERMANENT PACEMAKER INSERTION N/A 11/29/2012   Procedure: PERMANENT PACEMAKER INSERTION;  Surgeon: Ricky Klein, MD;  Location: [facility] CATH LAB;  Service: Cardiovascular;  Laterality: N/A;  . TYMPANOPLASTY  05/19/2011   Procedure: TYMPANOPLASTY;  Surgeon: Thornell Sartorius, MD;  Location: [facility] OR;  Service: ENT;  Laterality: Right;  Exploratory Tympanotomy     Family History  Problem Relation Age of Onset  . Heart attack Father      Social History   Socioeconomic History  . Marital status: Married    Spouse name: Not on file  . Number of children: Not on file  . Years of education: Not on file  . Highest education level: Not on file  Occupational  History  . Not on file  Social Needs  . Financial resource strain: Not on file  . Food insecurity:    Worry: Not on file    Inability: Not on file  . Transportation needs:    Medical: Not on file    Non-medical: Not on file  Tobacco Use  . Smoking status: Never Smoker  . Smokeless tobacco: Never Used  Substance and Sexual Activity  . Alcohol use: No  . Drug use: No  . Sexual activity: Not on file  Lifestyle  . Physical activity:    Days per week: Not on file    Minutes per session: Not on file  . Stress: Not on file  Relationships  . Social connections:    Talks on phone: Not on file    Gets together: Not on file    Attends religious service: Not on file    Active member of club or organization: Not on file    Attends meetings of clubs or organizations: Not on file    Relationship status: Not on file  . Intimate partner violence:    Fear of current or  ex partner: Not on file    Emotionally abused: Not on file    Physically abused: Not on file    Forced sexual activity: Not on file  Other Topics Concern  . Not on file  Social History Narrative  . Not on file     BP 134/80   Pulse 81   Ht 5\' 11"  (1.803 m)   Wt 230 lb (104.3 kg)   SpO2 97%   BMI 32.08 kg/m   Physical Exam:  Well appearing NAD HEENT: Unremarkable Neck:  No JVD, no thyromegally Lymphatics:  No adenopathy Back:  No CVA tenderness Lungs:  Clear HEART:  Regular rate rhythm, no murmurs, no rubs, no clicks Abd:  soft, positive bowel sounds, no organomegally, no rebound, no guarding Ext:  2 plus pulses, no edema, no cyanosis, no clubbing Skin:  No rashes no nodules Neuro:  CN II through XII intact, motor grossly intact  EKG - NSR with pacing induced LBBB  DEVICE  Normal device function.  See PaceArt for details.   Assess/Plan: 1. Chronic systolic heart failure - his EF has worsened by echo. He will undergo biv upgrade. I have discussed the indications/risks/benefits/goals/expectations and he wishes to proceed. 2. PPM - his device is working normally. We will recheck in several months. 3. HTN - his blood pressure is fairly well controlled. We will follow.  Mikle Bosworth.D.

## 2017-11-26 NOTE — Progress Notes (Signed)
Thank you, Ricky Barnett

## 2017-11-28 DIAGNOSIS — R9431 Abnormal electrocardiogram [ECG] [EKG]: Secondary | ICD-10-CM | POA: Diagnosis not present

## 2017-11-28 DIAGNOSIS — R001 Bradycardia, unspecified: Secondary | ICD-10-CM | POA: Diagnosis not present

## 2017-11-28 DIAGNOSIS — I471 Supraventricular tachycardia: Secondary | ICD-10-CM | POA: Diagnosis not present

## 2017-11-28 DIAGNOSIS — I472 Ventricular tachycardia: Secondary | ICD-10-CM | POA: Diagnosis not present

## 2017-12-04 ENCOUNTER — Ambulatory Visit (HOSPITAL_COMMUNITY)
Admission: RE | Admit: 2017-12-04 | Discharge: 2017-12-04 | Disposition: A | Discharge status: Home / Self Care | Payer: Medicare HMO | Source: Ambulatory Visit | Attending: Internal Medicine | Admitting: Internal Medicine

## 2017-12-04 ENCOUNTER — Ambulatory Visit (HOSPITAL_COMMUNITY): Payer: Medicare HMO

## 2017-12-04 ENCOUNTER — Encounter (HOSPITAL_COMMUNITY)
Admission: RE | Disposition: A | Discharge status: Home / Self Care | Payer: Self-pay | Source: Ambulatory Visit | Attending: Internal Medicine

## 2017-12-04 ENCOUNTER — Other Ambulatory Visit: Payer: Self-pay

## 2017-12-04 DIAGNOSIS — I447 Left bundle-branch block, unspecified: Secondary | ICD-10-CM | POA: Diagnosis not present

## 2017-12-04 DIAGNOSIS — I110 Hypertensive heart disease with heart failure: Secondary | ICD-10-CM | POA: Insufficient documentation

## 2017-12-04 DIAGNOSIS — Z5309 Procedure and treatment not carried out because of other contraindication: Secondary | ICD-10-CM | POA: Diagnosis not present

## 2017-12-04 DIAGNOSIS — Z7984 Long term (current) use of oral hypoglycemic drugs: Secondary | ICD-10-CM | POA: Diagnosis not present

## 2017-12-04 DIAGNOSIS — M1990 Unspecified osteoarthritis, unspecified site: Secondary | ICD-10-CM | POA: Diagnosis not present

## 2017-12-04 DIAGNOSIS — Z7901 Long term (current) use of anticoagulants: Secondary | ICD-10-CM | POA: Diagnosis not present

## 2017-12-04 DIAGNOSIS — I5042 Chronic combined systolic (congestive) and diastolic (congestive) heart failure: Secondary | ICD-10-CM | POA: Diagnosis present

## 2017-12-04 DIAGNOSIS — I5022 Chronic systolic (congestive) heart failure: Secondary | ICD-10-CM | POA: Insufficient documentation

## 2017-12-04 DIAGNOSIS — Z950 Presence of cardiac pacemaker: Secondary | ICD-10-CM

## 2017-12-04 DIAGNOSIS — I82B12 Acute embolism and thrombosis of left subclavian vein: Secondary | ICD-10-CM | POA: Diagnosis not present

## 2017-12-04 DIAGNOSIS — E119 Type 2 diabetes mellitus without complications: Secondary | ICD-10-CM | POA: Diagnosis not present

## 2017-12-04 DIAGNOSIS — I442 Atrioventricular block, complete: Secondary | ICD-10-CM | POA: Insufficient documentation

## 2017-12-04 DIAGNOSIS — R0989 Other specified symptoms and signs involving the circulatory and respiratory systems: Secondary | ICD-10-CM | POA: Diagnosis not present

## 2017-12-04 DIAGNOSIS — Z8249 Family history of ischemic heart disease and other diseases of the circulatory system: Secondary | ICD-10-CM | POA: Diagnosis not present

## 2017-12-04 HISTORY — PX: BIV UPGRADE: EP1202

## 2017-12-04 LAB — BASIC METABOLIC PANEL
Anion gap: 11 (ref 5–15)
BUN: 17 mg/dL (ref 8–23)
CO2: 21 mmol/L — AB (ref 22–32)
CREATININE: 1.59 mg/dL — AB (ref 0.61–1.24)
Calcium: 8.7 mg/dL — ABNORMAL LOW (ref 8.9–10.3)
Chloride: 107 mmol/L (ref 98–111)
GFR calc Af Amer: 46 mL/min — ABNORMAL LOW (ref >60)
GFR calc non Af Amer: 40 mL/min — ABNORMAL LOW (ref >60)
GLUCOSE: 118 mg/dL — AB (ref 70–99)
Potassium: 3.9 mmol/L (ref 3.5–5.1)
Sodium: 139 mmol/L (ref 135–145)

## 2017-12-04 LAB — CBC
HEMATOCRIT: 41.2 % (ref 39.0–52.0)
HEMOGLOBIN: 12.8 g/dL — AB (ref 13.0–17.0)
MCH: 26 pg (ref 26.0–34.0)
MCHC: 31.1 g/dL (ref 30.0–36.0)
MCV: 83.7 fL (ref 78.0–100.0)
Platelets: 294 10*3/uL (ref 150–400)
RBC: 4.92 MIL/uL (ref 4.22–5.81)
RDW: 15.9 % — AB (ref 11.5–15.5)
WBC: 5.8 10*3/uL (ref 4.0–10.5)

## 2017-12-04 LAB — SURGICAL PCR SCREEN
MRSA, PCR: POSITIVE — AB
STAPHYLOCOCCUS AUREUS: POSITIVE — AB

## 2017-12-04 LAB — GLUCOSE, CAPILLARY
GLUCOSE-CAPILLARY: 107 mg/dL — AB (ref 70–99)
Glucose-Capillary: 121 mg/dL — ABNORMAL HIGH (ref 70–99)

## 2017-12-04 SURGERY — BIV UPGRADE

## 2017-12-04 MED ORDER — IOPAMIDOL (ISOVUE-370) INJECTION 76%
INTRAVENOUS | Status: AC
  Filled 2017-12-04: qty 50

## 2017-12-04 MED ORDER — SODIUM CHLORIDE 0.9 % IV SOLN
INTRAVENOUS | Status: DC
  Administered 2017-12-04: 10:00:00 via INTRAVENOUS

## 2017-12-04 MED ORDER — LIDOCAINE HCL (PF) 1 % IJ SOLN
INTRAMUSCULAR | Status: DC | PRN
  Administered 2017-12-04: 45 mL

## 2017-12-04 MED ORDER — CEFAZOLIN SODIUM-DEXTROSE 2-4 GM/100ML-% IV SOLN
2.0000 g | INTRAVENOUS | Status: AC
  Administered 2017-12-04: 2 g via INTRAVENOUS
  Filled 2017-12-04: qty 100

## 2017-12-04 MED ORDER — SODIUM CHLORIDE 0.9 % IV SOLN
INTRAVENOUS | Status: AC
  Filled 2017-12-04: qty 2

## 2017-12-04 MED ORDER — SODIUM CHLORIDE 0.9 % IV SOLN
80.0000 mg | INTRAVENOUS | Status: DC
  Filled 2017-12-04: qty 2

## 2017-12-04 MED ORDER — HEPARIN (PORCINE) IN NACL 1000-0.9 UT/500ML-% IV SOLN
INTRAVENOUS | Status: AC
  Filled 2017-12-04: qty 500

## 2017-12-04 MED ORDER — FENTANYL CITRATE (PF) 100 MCG/2ML IJ SOLN
INTRAMUSCULAR | Status: AC
  Filled 2017-12-04: qty 2

## 2017-12-04 MED ORDER — MUPIROCIN 2 % EX OINT
TOPICAL_OINTMENT | CUTANEOUS | Status: AC
  Administered 2017-12-04: 10:00:00
  Filled 2017-12-04: qty 22

## 2017-12-04 MED ORDER — ONDANSETRON HCL 4 MG/2ML IJ SOLN: 4.0000 mg | Freq: Four times a day (QID) | INTRAMUSCULAR | Status: DC | PRN

## 2017-12-04 MED ORDER — ACETAMINOPHEN 325 MG PO TABS
325.0000 mg | ORAL_TABLET | ORAL | Status: DC | PRN
  Filled 2017-12-04: qty 2

## 2017-12-04 MED ORDER — IOPAMIDOL (ISOVUE-370) INJECTION 76%
INTRAVENOUS | Status: DC | PRN
  Administered 2017-12-04: 15 mL via INTRAVENOUS

## 2017-12-04 MED ORDER — LIDOCAINE HCL (PF) 1 % IJ SOLN
INTRAMUSCULAR | Status: AC
  Filled 2017-12-04: qty 60

## 2017-12-04 MED ORDER — MIDAZOLAM HCL 5 MG/5ML IJ SOLN
INTRAMUSCULAR | Status: AC
  Filled 2017-12-04: qty 5

## 2017-12-04 MED ORDER — FENTANYL CITRATE (PF) 100 MCG/2ML IJ SOLN
INTRAMUSCULAR | Status: DC | PRN
  Administered 2017-12-04: 25 ug via INTRAVENOUS

## 2017-12-04 MED ORDER — MIDAZOLAM HCL 5 MG/5ML IJ SOLN
INTRAMUSCULAR | Status: DC | PRN
  Administered 2017-12-04 (×2): 1 mg via INTRAVENOUS

## 2017-12-04 MED ORDER — HEPARIN (PORCINE) IN NACL 1000-0.9 UT/500ML-% IV SOLN
INTRAVENOUS | Status: DC | PRN
  Administered 2017-12-04: 500 mL

## 2017-12-04 MED ORDER — CHLORHEXIDINE GLUCONATE 4 % EX LIQD
60.0000 mL | Freq: Once | CUTANEOUS | Status: DC
  Filled 2017-12-04: qty 60

## 2017-12-04 SURGICAL SUPPLY — 9 items
CABLE SURGICAL S-101-97-12 (CABLE) ×3 IMPLANT
HOVERMATT SINGLE USE (MISCELLANEOUS) ×3 IMPLANT
KIT ESSENTIALS PG (KITS) ×3 IMPLANT
PAD PRO RADIOLUCENT 2001M-C (PAD) ×3 IMPLANT
SHEATH CLASSIC 9.5F (SHEATH) ×3 IMPLANT
SHEATH PINNACLE 5F 10CM (SHEATH) ×3 IMPLANT
TRAY PACEMAKER INSERTION (PACKS) ×3 IMPLANT
WIRE AQUATRAK .035X150 ANG (WIRE) ×3 IMPLANT
WIRE ASAHI MIRACLEBROS-3 180CM (WIRE) ×3 IMPLANT

## 2017-12-04 NOTE — Interval H&P Note (Signed)
History and Physical Interval Note:  12/04/2017 1:35 PM  [name]  has presented today for surgery, with the diagnosis of Left branch block, HF  The various methods of treatment have been discussed with the patient and family. After consideration of risks, benefits and other options for treatment, the patient has consented to  Procedure(s): Dual Chamber PPM upgrading to BIV PPM (N/A) as a surgical intervention .  The patient's history has been reviewed, patient examined, no change in status, stable for surgery.  I have reviewed the patient's chart and labs.  Questions were answered to the patient's satisfaction.     Ricky Barnett

## 2017-12-04 NOTE — Progress Notes (Signed)
Lab called to give Positive PCA results.  Pt and Dr Renee Rival notified

## 2017-12-04 NOTE — Interval H&P Note (Signed)
History and Physical Interval Note:  12/04/2017 10:53 AM  Ricky Barnett  has presented today for surgery, with the diagnosis of Left branch block, HF  The various methods of treatment have been discussed with the patient and family. After consideration of risks, benefits and other options for treatment, the patient has consented to  Procedure(s): Dual Chamber PPM upgrading to BIV PPM (N/A) as a surgical intervention .  The patient's history has been reviewed, patient examined, no change in status, stable for surgery.  I have reviewed the patient's chart and labs.  Questions were answered to the patient's satisfaction.     [name]

## 2017-12-04 NOTE — Discharge Instructions (Signed)
Wound Care, Adult Taking care of your wound properly can help to prevent pain and infection. It can also help your wound to heal more quickly. How is this treated? Wound care  Follow instructions from your health care provider about how to take care of your wound. Make sure you: ? Wash your hands with soap and water before you change the bandage (dressing). If soap and water are not available, use hand sanitizer. ? Change your dressing as told by your health care provider. ? Leave stitches (sutures), skin glue, or adhesive strips in place. These skin closures may need to stay in place for 2 weeks or longer. If adhesive strip edges start to loosen and curl up, you may trim the loose edges. Do not remove adhesive strips completely unless your health care provider tells you to do that. ? Dressing may be removed in 24-48 hours. Then you may shower.  Check your wound area every day for signs of infection. Check for: ? More redness, swelling, or pain. ? More fluid or blood. ? Warmth. ? Pus or a bad smell.  Ask your health care provider if you should clean the wound with mild soap and water. Doing this may include: ? Using a clean towel to pat the wound dry after cleaning it. Do not rub or scrub the wound. ? Applying a cream or ointment. Do this only as told by your health care provider. ? Covering the incision with a clean dressing.  Ask your health care provider when you can leave the wound uncovered. General instructions  Return to your normal activities as told by your health care provider. Ask your health care provider what activities are safe.  Do not scratch or pick at the wound.  Keep all follow-up visits as told by your health care provider. This is important.  Eat a diet that includes protein, vitamin A, vitamin C, and other nutrient-rich foods. These help the wound heal: ? Protein-rich foods include meat, dairy, beans, nuts, and other sources. ? Vitamin A-rich foods include  carrots and dark green, leafy vegetables. ? Vitamin C-rich foods include citrus, tomatoes, and other fruits and vegetables. ? Nutrient-rich foods have protein, carbohydrates, fat, vitamins, or minerals. Eat a variety of healthy foods including vegetables, fruits, and whole grains. Contact a health care provider if:  You received a tetanus shot and you have swelling, severe pain, redness, or bleeding at the injection site.  Your pain is not controlled with medicine.  You have more redness, swelling, or pain around the wound.  You have more fluid or blood coming from the wound.  Your wound feels warm to the touch.  You have pus or a bad smell coming from the wound.  You have a fever or chills.  You are nauseous or you vomit.  You are dizzy. Get help right away if:  You have a red streak going away from your wound.  The edges of the wound open up and separate.  Your wound is bleeding and the bleeding does not stop with gentle pressure.  You have a rash.  You faint.  You have trouble breathing. This information is not intended to replace advice given to you by your health care provider. Make sure you discuss any questions you have with your health care provider. Document Released: 11/27/2007 Document Revised: 10/17/2015 Document Reviewed: 09/04/2015 Elsevier Interactive Patient Education  2017 [place].  Moderate Conscious Sedation, Adult, Care After These instructions provide you with information about caring for yourself after  your procedure. Your health care provider may also give you more specific instructions. Your treatment has been planned according to current medical practices, but problems sometimes occur. Call your health care provider if you have any problems or questions after your procedure. What can I expect after the procedure? After your procedure, it is common:  To feel sleepy for several hours.  To feel clumsy and have poor balance for several  hours.  To have poor judgment for several hours.  To vomit if you eat too soon.  Follow these instructions at home: For at least 24 hours after the procedure:   Do not: ? Participate in activities where you could fall or become injured. ? Drive. ? Use heavy machinery. ? Drink alcohol. ? Take sleeping pills or medicines that cause drowsiness. ? Make important decisions or sign legal documents. ? Take care of children on your own.  Rest. Eating and drinking  Follow the diet recommended by your health care provider.  If you vomit: ? Drink water, juice, or soup when you can drink without vomiting. ? Make sure you have little or no nausea before eating solid foods. General instructions  Have a responsible adult stay with you until you are awake and alert.  Take over-the-counter and prescription medicines only as told by your health care provider.  If you smoke, do not smoke without supervision.  Keep all follow-up visits as told by your health care provider. This is important. Contact a health care provider if:  You keep feeling nauseous or you keep vomiting.  You feel light-headed.  You develop a rash.  You have a fever. Get help right away if:  You have trouble breathing. This information is not intended to replace advice given to you by your health care provider. Make sure you discuss any questions you have with your health care provider. Document Released: 12/08/2012 Document Revised: 07/23/2015 Document Reviewed: 06/09/2015 Elsevier Interactive Patient Education  [employer].

## 2017-12-04 NOTE — Progress Notes (Signed)
Pt given instructions about positive PCR and bactroban use.

## 2017-12-08 ENCOUNTER — Emergency Department (HOSPITAL_COMMUNITY): Payer: Medicare HMO

## 2017-12-08 ENCOUNTER — Other Ambulatory Visit: Payer: Self-pay

## 2017-12-08 ENCOUNTER — Encounter (HOSPITAL_COMMUNITY): Payer: Self-pay | Admitting: Internal Medicine

## 2017-12-08 ENCOUNTER — Inpatient Hospital Stay (HOSPITAL_COMMUNITY)
Admission: EM | Admit: 2017-12-08 | Discharge: 2017-12-10 | DRG: 291 | Disposition: A | Discharge status: Home / Self Care | Payer: Medicare HMO | Attending: Cardiovascular Disease | Admitting: Cardiovascular Disease

## 2017-12-08 ENCOUNTER — Telehealth: Payer: Self-pay | Admitting: Cardiovascular Disease

## 2017-12-08 DIAGNOSIS — M170 Bilateral primary osteoarthritis of knee: Secondary | ICD-10-CM | POA: Diagnosis present

## 2017-12-08 DIAGNOSIS — N179 Acute kidney failure, unspecified: Secondary | ICD-10-CM | POA: Diagnosis not present

## 2017-12-08 DIAGNOSIS — E1122 Type 2 diabetes mellitus with diabetic chronic kidney disease: Secondary | ICD-10-CM | POA: Diagnosis present

## 2017-12-08 DIAGNOSIS — I480 Paroxysmal atrial fibrillation: Secondary | ICD-10-CM | POA: Diagnosis present

## 2017-12-08 DIAGNOSIS — T82897A Other specified complication of cardiac prosthetic devices, implants and grafts, initial encounter: Secondary | ICD-10-CM | POA: Diagnosis not present

## 2017-12-08 DIAGNOSIS — Z79899 Other long term (current) drug therapy: Secondary | ICD-10-CM

## 2017-12-08 DIAGNOSIS — Z8249 Family history of ischemic heart disease and other diseases of the circulatory system: Secondary | ICD-10-CM | POA: Diagnosis not present

## 2017-12-08 DIAGNOSIS — E785 Hyperlipidemia, unspecified: Secondary | ICD-10-CM | POA: Diagnosis present

## 2017-12-08 DIAGNOSIS — N183 Chronic kidney disease, stage 3 (moderate): Secondary | ICD-10-CM | POA: Diagnosis present

## 2017-12-08 DIAGNOSIS — G3184 Mild cognitive impairment, so stated: Secondary | ICD-10-CM | POA: Diagnosis present

## 2017-12-08 DIAGNOSIS — I428 Other cardiomyopathies: Secondary | ICD-10-CM | POA: Diagnosis present

## 2017-12-08 DIAGNOSIS — I5043 Acute on chronic combined systolic (congestive) and diastolic (congestive) heart failure: Secondary | ICD-10-CM | POA: Diagnosis present

## 2017-12-08 DIAGNOSIS — I441 Atrioventricular block, second degree: Secondary | ICD-10-CM | POA: Diagnosis present

## 2017-12-08 DIAGNOSIS — I82B12 Acute embolism and thrombosis of left subclavian vein: Secondary | ICD-10-CM | POA: Diagnosis present

## 2017-12-08 DIAGNOSIS — I509 Heart failure, unspecified: Secondary | ICD-10-CM | POA: Diagnosis not present

## 2017-12-08 DIAGNOSIS — I214 Non-ST elevation (NSTEMI) myocardial infarction: Secondary | ICD-10-CM

## 2017-12-08 DIAGNOSIS — E669 Obesity, unspecified: Secondary | ICD-10-CM

## 2017-12-08 DIAGNOSIS — I272 Pulmonary hypertension, unspecified: Secondary | ICD-10-CM | POA: Diagnosis not present

## 2017-12-08 DIAGNOSIS — E1169 Type 2 diabetes mellitus with other specified complication: Secondary | ICD-10-CM | POA: Diagnosis not present

## 2017-12-08 DIAGNOSIS — I130 Hypertensive heart and chronic kidney disease with heart failure and stage 1 through stage 4 chronic kidney disease, or unspecified chronic kidney disease: Secondary | ICD-10-CM | POA: Diagnosis not present

## 2017-12-08 DIAGNOSIS — R948 Abnormal results of function studies of other organs and systems: Secondary | ICD-10-CM | POA: Diagnosis not present

## 2017-12-08 DIAGNOSIS — Z8679 Personal history of other diseases of the circulatory system: Secondary | ICD-10-CM

## 2017-12-08 DIAGNOSIS — E119 Type 2 diabetes mellitus without complications: Secondary | ICD-10-CM

## 2017-12-08 DIAGNOSIS — Z7901 Long term (current) use of anticoagulants: Secondary | ICD-10-CM | POA: Diagnosis not present

## 2017-12-08 DIAGNOSIS — Z950 Presence of cardiac pacemaker: Secondary | ICD-10-CM | POA: Diagnosis present

## 2017-12-08 DIAGNOSIS — Y831 Surgical operation with implant of artificial internal device as the cause of abnormal reaction of the patient, or of later complication, without mention of misadventure at the time of the procedure: Secondary | ICD-10-CM | POA: Diagnosis present

## 2017-12-08 DIAGNOSIS — Z7984 Long term (current) use of oral hypoglycemic drugs: Secondary | ICD-10-CM

## 2017-12-08 DIAGNOSIS — R0602 Shortness of breath: Secondary | ICD-10-CM

## 2017-12-08 DIAGNOSIS — I2510 Atherosclerotic heart disease of native coronary artery without angina pectoris: Secondary | ICD-10-CM | POA: Diagnosis present

## 2017-12-08 DIAGNOSIS — I4892 Unspecified atrial flutter: Secondary | ICD-10-CM | POA: Diagnosis not present

## 2017-12-08 DIAGNOSIS — I340 Nonrheumatic mitral (valve) insufficiency: Secondary | ICD-10-CM | POA: Diagnosis present

## 2017-12-08 DIAGNOSIS — I10 Essential (primary) hypertension: Secondary | ICD-10-CM | POA: Diagnosis present

## 2017-12-08 LAB — CBC
HCT: 46.3 % (ref 39.0–52.0)
Hemoglobin: 13.9 g/dL (ref 13.0–17.0)
MCH: 25.4 pg — ABNORMAL LOW (ref 26.0–34.0)
MCHC: 30 g/dL (ref 30.0–36.0)
MCV: 84.6 fL (ref 80.0–100.0)
NRBC: 0.7 % — AB (ref 0.0–0.2)
PLATELETS: 364 10*3/uL (ref 150–400)
RBC: 5.47 MIL/uL (ref 4.22–5.81)
RDW: 17.6 % — AB (ref 11.5–15.5)
WBC: 8.7 10*3/uL (ref 4.0–10.5)

## 2017-12-08 LAB — GLUCOSE, CAPILLARY: GLUCOSE-CAPILLARY: 126 mg/dL — AB (ref 70–99)

## 2017-12-08 LAB — I-STAT TROPONIN, ED: Troponin i, poc: 0.18 ng/mL (ref 0.00–0.08)

## 2017-12-08 LAB — BASIC METABOLIC PANEL
Anion gap: 16 — ABNORMAL HIGH (ref 5–15)
BUN: 28 mg/dL — AB (ref 8–23)
CALCIUM: 8.9 mg/dL (ref 8.9–10.3)
CO2: 19 mmol/L — ABNORMAL LOW (ref 22–32)
CREATININE: 1.81 mg/dL — AB (ref 0.61–1.24)
Chloride: 105 mmol/L (ref 98–111)
GFR calc non Af Amer: 34 mL/min — ABNORMAL LOW (ref >60)
GFR, EST AFRICAN AMERICAN: 40 mL/min — AB (ref >60)
Glucose, Bld: 148 mg/dL — ABNORMAL HIGH (ref 70–99)
Potassium: 4.5 mmol/L (ref 3.5–5.1)
SODIUM: 140 mmol/L (ref 135–145)

## 2017-12-08 LAB — MRSA PCR SCREENING: MRSA by PCR: POSITIVE — AB

## 2017-12-08 LAB — BRAIN NATRIURETIC PEPTIDE: B Natriuretic Peptide: 3398.8 pg/mL — ABNORMAL HIGH (ref 0.0–100.0)

## 2017-12-08 MED ORDER — PRAVASTATIN SODIUM 40 MG PO TABS
40.0000 mg | ORAL_TABLET | Freq: Every day | ORAL | Status: DC
  Administered 2017-12-09 – 2017-12-10 (×2): 40 mg via ORAL
  Filled 2017-12-08 (×2): qty 1

## 2017-12-08 MED ORDER — FUROSEMIDE 10 MG/ML IJ SOLN
40.0000 mg | Freq: Once | INTRAMUSCULAR | Status: AC
  Administered 2017-12-08: 40 mg via INTRAVENOUS
  Filled 2017-12-08: qty 4

## 2017-12-08 MED ORDER — ACETAMINOPHEN 325 MG PO TABS
650.0000 mg | ORAL_TABLET | Freq: Four times a day (QID) | ORAL | Status: DC | PRN
  Filled 2017-12-08: qty 2

## 2017-12-08 MED ORDER — METOPROLOL SUCCINATE ER 50 MG PO TB24
50.0000 mg | ORAL_TABLET | Freq: Every day | ORAL | Status: DC
  Administered 2017-12-09 – 2017-12-10 (×2): 50 mg via ORAL
  Filled 2017-12-08 (×2): qty 1

## 2017-12-08 MED ORDER — APIXABAN 5 MG PO TABS
5.0000 mg | ORAL_TABLET | Freq: Two times a day (BID) | ORAL | Status: DC
  Administered 2017-12-09 – 2017-12-10 (×4): 5 mg via ORAL
  Filled 2017-12-08 (×4): qty 1

## 2017-12-08 MED ORDER — TAMSULOSIN HCL 0.4 MG PO CAPS
0.4000 mg | ORAL_CAPSULE | Freq: Every day | ORAL | Status: DC
  Administered 2017-12-09 (×2): 0.4 mg via ORAL
  Filled 2017-12-08 (×2): qty 1

## 2017-12-08 MED ORDER — SACUBITRIL-VALSARTAN 49-51 MG PO TABS
1.0000 | ORAL_TABLET | Freq: Two times a day (BID) | ORAL | Status: DC
  Administered 2017-12-09 (×3): 1 via ORAL
  Filled 2017-12-08 (×4): qty 1

## 2017-12-08 MED ORDER — POTASSIUM CHLORIDE CRYS ER 20 MEQ PO TBCR
20.0000 meq | EXTENDED_RELEASE_TABLET | Freq: Every day | ORAL | Status: DC
  Administered 2017-12-09 – 2017-12-10 (×2): 20 meq via ORAL
  Filled 2017-12-08 (×2): qty 1

## 2017-12-08 MED ORDER — FUROSEMIDE 10 MG/ML IJ SOLN
40.0000 mg | Freq: Two times a day (BID) | INTRAMUSCULAR | Status: DC
  Administered 2017-12-09 (×2): 40 mg via INTRAVENOUS
  Filled 2017-12-08 (×3): qty 4

## 2017-12-08 MED FILL — Gentamicin Sulfate Inj 40 MG/ML: INTRAMUSCULAR | Qty: 80 | Status: AC

## 2017-12-08 NOTE — ED Provider Notes (Signed)
Care assumed from PA [facility] at shift change, please see his note for full details, but in brief Ricky Barnett is a 77 y.o. male who presents for evaluation of worsening dyspnea, was recently admitted to have his pacemaker updated but this was unable to be completed due to a blockage.  Since discharge from the hospital worsening dyspnea reported by patient and family.  Hospital work-up significant for troponin of 0.18, and BNP of 3398.8.  Kidney function slightly increased from previous at 1.81 no other acute electrolyte derangements requiring intervention, no leukocytosis and normal hemoglobin.  Chest x-ray showed cardiomegaly but is otherwise unremarked well.  At shift change awaiting evaluate patient by cardiology for further recommendations regarding cath versus heart failure exacerbation being the likely cause of patient's elevated troponin.  We will hold off on heparinizing the patient until cardiology evaluates.  6:00 PM Dr. Sallyanne Kuster at the bedside and feels this is all more likely related to heart failure exacerbation and fluid overload, he will admit the patient to cardiology service for diuresis.   Jacqlyn Larsen, PA-C 12/08/17 1831    Dorie Rank, MD 12/09/17 [phone]

## 2017-12-08 NOTE — Telephone Encounter (Signed)
° ° °  Pt c/o Shortness Of Breath: STAT if SOB developed within the last 24 hours or pt is noticeably SOB on the phone  1. Are you currently SOB (can you hear that pt is SOB on the phone)? YES  2. How long have you been experiencing SOB? 2 WEEKS GETTING WORSE  3. Are you SOB when sitting or when up moving around? SITTING /MOVING   4. Are you currently experiencing any other symptoms? NO

## 2017-12-08 NOTE — ED Notes (Signed)
Cardiology at the bedside.

## 2017-12-08 NOTE — ED Provider Notes (Signed)
The patient is a 77 year old male, he has a known history of congestive heart failure, he has a pacemaker, he was considered for an upgraded pacemaker and recently went to a procedure on 4 October, I do not have all the details of this encounter however the family was told that because of a "blockage" the patient was unable to proceed with the pacemaker change at that time.  He states that ever since being home over the last 5 days he has had progressive shortness of breath, his wife states that he is wheezing very loudly at night and appearing more and more dyspneic, the patient also endorses having increased swelling of his bilateral lower extremities.  On exam the patient is tachycardic to 105, he is tachypnea, he has overall normal lung sounds but is breathing at approximately 26 to 28 breaths/min.  He has 1+ bilateral symmetrical pitting edema below the knees.  EKG shows paced rhythm with a tachycardia at approximately 105, Troponin is elevated at 0.18, it is unclear whether this represents true ischemia or just worsening heart failure.  Will discuss with cardiology for further evaluation, he likely needs to be inpatient for further consideration and clarification of records as it is not clear what blockage the patient has that would prevent further pacemaker placement.  His blood pressure is stable, the patient is showing oxygenation of 98 to 100% on his baseline oxygen.  Patient will need to be admitted to the hospital, the cardiology service has been consulted due to the patient's non-ST elevation myocardial infarction which is likely secondary to his congestive heart failure and fluid overload.  I agree with the critical nature of the patient's illness and the documentation from the physician assistant   EKG Interpretation  Date/Time:  Tuesday December 08 2017 13:52:12 EDT Ventricular Rate:  106 PR Interval:  164 QRS Duration: 182 QT Interval:  402 QTC Calculation: 533 R  Axis:   -71 Text Interpretation:  Atrial-sensed ventricular-paced rhythm Abnormal ECG Since last tracing rate faster Confirmed by Noemi Chapel [phone]) on 12/08/2017 2:54:26 PM      Medical screening examination/treatment/procedure(s) were conducted as a shared visit with non-physician practitioner(s) and myself.  I personally evaluated the patient during the encounter.  Clinical Impression:   Final diagnoses:  NSTEMI (non-ST elevated myocardial infarction) ([place])  History of CHF (congestive heart failure)  Shortness of breath         Noemi Chapel, MD 12/09/17 [phone]

## 2017-12-08 NOTE — Telephone Encounter (Signed)
Pt called but so SOB that he could not even speak to me on the phone so he gave the phone to his wife and she reports that his breathing is so labored that he is gagging and vomiting on and off. She said it is significantly worse today and he does not feel well at all. She agrees to call EMS and have the pt transported to the ER.

## 2017-12-08 NOTE — ED Triage Notes (Signed)
Pt came in on Friday to have his pacemaker replaced, was unable to do so on Friday due to a blockage per wife. Pt has had increased SOB over the last week since being in the hospital.

## 2017-12-08 NOTE — Discharge Instructions (Addendum)
Please monitor your weight EVERY day. Notify the office if you gain 3lbs in one day OR lose 3lbs in one day to get advice on how to take your lasix. Your weight on the day of discharge was 220lbs.  Limit your salt intake to less than 2000mg  per day. Limit your fluid intake to less than 2 liters per day.   Your entresto and lasix doses were increased this admission. Please pick up your new prescriptions at your pharmacy.   Information on my medicine - ELIQUIS (apixaban)  This medication education was reviewed with me or my healthcare representative as part of my discharge preparation.    Why was Eliquis prescribed for you? Eliquis was prescribed for you to reduce the risk of forming blood clots that can cause a stroke if you have a medical condition called atrial fibrillation (a type of irregular heartbeat) OR to reduce the risk of a blood clots forming after orthopedic surgery.  What do You need to know about Eliquis ? Take your Eliquis TWICE DAILY - one tablet in the morning and one tablet in the evening with or without food.  It would be best to take the doses about the same time each day.  If you have difficulty swallowing the tablet whole please discuss with your pharmacist how to take the medication safely.  Take Eliquis exactly as prescribed by your doctor and DO NOT stop taking Eliquis without talking to the doctor who prescribed the medication.  Stopping may increase your risk of developing a new clot or stroke.  Refill your prescription before you run out.  After discharge, you should have regular check-up appointments with your healthcare provider that is prescribing your Eliquis.  In the future your dose may need to be changed if your kidney function or weight changes by a significant amount or as you get older.  What do you do if you miss a dose? If you miss a dose, take it as soon as you remember on the same day and resume taking twice daily.  Do not take more than one  dose of ELIQUIS at the same time.  Important Safety Information A possible side effect of Eliquis is bleeding. You should call your healthcare provider right away if you experience any of the following: ? Bleeding from an injury or your nose that does not stop. ? Unusual colored urine (red or dark brown) or unusual colored stools (red or black). ? Unusual bruising for unknown reasons. ? A serious fall or if you hit your head (even if there is no bleeding).  Some medicines may interact with Eliquis and might increase your risk of bleeding or clotting while on Eliquis. To help avoid this, consult your healthcare provider or pharmacist prior to using any new prescription or non-prescription medications, including herbals, vitamins, non-steroidal anti-inflammatory drugs (NSAIDs) and supplements.  This website has more information on Eliquis (apixaban): [email or web address].

## 2017-12-08 NOTE — ED Notes (Signed)
ED Provider at bedside. 

## 2017-12-08 NOTE — ED Provider Notes (Signed)
[facility] EMERGENCY DEPARTMENT Provider Note   CSN: [id number] Arrival date & time: 12/08/17  1339     History   Chief Complaint Chief Complaint  Patient presents with  . Chest Pain    HPI Ricky Barnett is a 77 y.o. male with a history of CHF with pacemaker, hypertension, CAD, diabetes who presents emergency department today for shortness of breath.  Patient was recently admitted on October 4 to have a change of pacemakers due to worsening EF and dyspnea. Family reports he was not change of pacemaker secondary to a "blockage".  He notes since discharge he has become progressively short of breath with wife reports he has been more dyspneic.  They report that he has had mild increase of lower extremity swelling.  Report he has had some nausea and 3 episodes of emesis over the last several days.  Last episode was yesterday.  Patient denies any chest pain, back pain, neck pain, jaw pain, wife reports that he has been mildly diaphoretic at times.  His symptoms been continuous since onset.  Wife reports the patient has been compliant with his home Lasix and other medications.  HPI  Past Medical History:  Diagnosis Date  . Angina    just started recently ,2-3 days ago-went to see Dr. Einar Gip 05/14/2011  . Arthritis    bil knees  . Diabetes mellitus    was at one time lost weight amd no problem with it now. About 2-3 yrs ago  . Dysrhythmia    2-3 yrs ago  . Headache(784.0)    occas  . Hypertension   . Mastoiditis    right ear  . Pacemaker 11/29/2012   dual chamber  . Palpitations     Patient Active Problem List   Diagnosis Date Noted  . Chronic systolic heart failure ([place]) 12/04/2017  . Paroxysmal atrial fibrillation ([place]) 10/31/2017  . Acute on chronic combined systolic and diastolic CHF (congestive heart failure) ([place]) 04/23/2017  . Nonischemic cardiomyopathy ([place]) 10/14/2014  . Erectile dysfunction 01/09/2013  . Pacemaker 11/30/2012  . NSVT (nonsustained  ventricular tachycardia) ([place]) 11/13/2012  . AV block, Mobitz 1, with HR to 39 10/25/2012  . AV junctional tachycardia ([place]) 10/25/2012  . Essential hypertension 10/18/2012  . Dyslipidemia 10/18/2012  . Chest pain 10/18/2012  . Non-insulin dependent type 2 diabetes mellitus ([place]) 10/18/2012  . First degree AV block 10/18/2012  . Palpitations 10/18/2012    Past Surgical History:  Procedure Laterality Date  . BIV UPGRADE N/A 12/04/2017   Procedure: Dual Chamber PPM upgrading to BIV PPM;  Surgeon: Evans Lance, MD;  Location: [facility];  Service: Cardiovascular;  Laterality: N/A;  . CYSTECTOMY     under chin 1983  . FINGER SURGERY     states he cut finger off when cutting wood  . INSERT / REPLACE / REMOVE PACEMAKER  11/29/2012   [employer]   . NASAL SINUS SURGERY     left side  . PERMANENT PACEMAKER INSERTION N/A 11/29/2012   Procedure: PERMANENT PACEMAKER INSERTION;  Surgeon: Sanda Klein, MD;  Location: [facility];  Service: Cardiovascular;  Laterality: N/A;  . TYMPANOPLASTY  05/19/2011   Procedure: TYMPANOPLASTY;  Surgeon: Thornell Sartorius, MD;  Location: [facility];  Service: ENT;  Laterality: Right;  Exploratory Tympanotomy        Home Medications    Prior to Admission medications   Medication Sig Start Date End Date Taking? Authorizing Provider  acetaminophen (TYLENOL) 325  MG tablet Take 1-2 tablets (325-650 mg total) by mouth every 4 (four) hours as needed. Patient taking differently: Take 325-650 mg by mouth every 4 (four) hours as needed (for pain.).  11/30/12   Isaiah Serge, NP  apixaban (ELIQUIS) 5 MG TABS tablet Take 1 tablet (5 mg total) by mouth 2 (two) times daily. 10/29/17   Croitoru, Mihai, MD  furosemide (LASIX) 40 MG tablet Take 1 tablet (40 mg total) by mouth daily. 04/23/17 12/01/18  Erlene Quan, PA-C  metFORMIN (GLUCOPHAGE-XR) 750 MG 24 hr tablet Take 750 mg by mouth 2 (two) times daily.  10/15/12   [provider]  metoprolol  succinate (TOPROL-XL) 50 MG 24 hr tablet Take 50 mg by mouth 2 (two) times daily. Take with or immediately following a meal.    [provider]  Multiple Vitamin (MULTIVITAMIN) tablet Take 1 tablet by mouth daily.    [provider]  potassium chloride SA (K-DUR,KLOR-CON) 20 MEQ tablet Take 1 tablet (20 mEq total) by mouth daily. 04/23/17 12/01/18  Erlene Quan, PA-C  pravastatin (PRAVACHOL) 40 MG tablet Take 1 tablet (40 mg total) by mouth daily. 07/13/17   Croitoru, Mihai, MD  sacubitril-valsartan (ENTRESTO) 49-51 MG Take 1 tablet by mouth 2 (two) times daily. 11/23/17   Croitoru, Mihai, MD  Tamsulosin HCl (FLOMAX) 0.4 MG CAPS Take 0.4 mg by mouth daily after supper.    [provider]    Family History Family History  Problem Relation Age of Onset  . Heart attack Father     Social History Social History   Tobacco Use  . Smoking status: Never Smoker  . Smokeless tobacco: Never Used  Substance Use Topics  . Alcohol use: No  . Drug use: No     Allergies   Patient has no known allergies.   Review of Systems Review of Systems  All other systems reviewed and are negative.    Physical Exam Updated Vital Signs BP (!) 133/91 (BP Location: Left Arm)   Pulse (!) 102   Temp 97.7 F (36.5 C) (Oral)   Resp (!) 24   Ht 5\' 11"  (1.803 m)   Wt 111.1 kg   SpO2 95%   BMI 34.17 kg/m   Physical Exam  Constitutional: He appears well-developed and well-nourished.  Appears uncomrtable  HENT:  Head: Normocephalic and atraumatic.  Right Ear: External ear normal.  Left Ear: External ear normal.  Nose: Nose normal.  Mouth/Throat: Uvula is midline, oropharynx is clear and moist and mucous membranes are normal. No tonsillar exudate.  Eyes: Pupils are equal, round, and reactive to light. Right eye exhibits no discharge. Left eye exhibits no discharge. No scleral icterus.  Neck: Trachea normal. Neck supple. No spinous process tenderness present. No neck rigidity.  Normal range of motion present.  Cardiovascular: Normal rate, regular rhythm and intact distal pulses.  No murmur heard. Pulses:      Radial pulses are 2+ on the right side, and 2+ on the left side.       Dorsalis pedis pulses are 2+ on the right side, and 2+ on the left side.       Posterior tibial pulses are 2+ on the right side, and 2+ on the left side.  Non-pitting edema of the lower extremities  Pulmonary/Chest: Breath sounds normal. Accessory muscle usage present. Tachypnea noted. He exhibits no tenderness.  On 2L O2  Abdominal: Soft. Bowel sounds are normal. There is no tenderness. There is no rebound and  no guarding.  Musculoskeletal: He exhibits no edema.  Lymphadenopathy:    He has no cervical adenopathy.  Neurological: He is alert.  Skin: Skin is warm and dry. No rash noted. He is not diaphoretic.  Psychiatric: He has a normal mood and affect.  Nursing note and vitals reviewed.    ED Treatments / Results  Labs (all labs ordered are listed, but only abnormal results are displayed) Labs Reviewed  BASIC METABOLIC PANEL - Abnormal; Notable for the following components:      Result Value   CO2 19 (*)    Glucose, Bld 148 (*)    BUN 28 (*)    Creatinine, Ser 1.81 (*)    GFR calc non Af Amer 34 (*)    GFR calc Af Amer 40 (*)    Anion gap 16 (*)    All other components within normal limits  CBC - Abnormal; Notable for the following components:   MCH 25.4 (*)    RDW 17.6 (*)    nRBC 0.7 (*)    All other components within normal limits  I-STAT TROPONIN, ED - Abnormal; Notable for the following components:   Troponin i, poc 0.18 (*)    All other components within normal limits  BRAIN NATRIURETIC PEPTIDE    EKG EKG Interpretation  Date/Time:  Tuesday December 08 2017 13:52:12 EDT Ventricular Rate:  106 PR Interval:  164 QRS Duration: 182 QT Interval:  402 QTC Calculation: 533 R Axis:   -71 Text Interpretation:  Atrial-sensed ventricular-paced rhythm Abnormal ECG  Since last tracing rate faster Confirmed by Noemi Chapel [phone]) on 12/08/2017 2:54:26 PM   Radiology Dg Chest 1 View  Result Date: 12/08/2017 CLINICAL DATA:  Shortness of breath. EXAM: CHEST  1 VIEW COMPARISON:  12/04/2017 FINDINGS: Dual-chamber pacer leads from the left are in stable position. Chronic cardiomegaly and hilar vessel prominence. There is no edema, consolidation, effusion, or pneumothorax. IMPRESSION: 1. No evidence of active disease. 2. Cardiomegaly. Electronically Signed   By: Monte Fantasia M.D.   On: 12/08/2017 15:09    Procedures Procedures (including critical care time) CRITICAL CARE Performed by: Jillyn Ledger   Total critical care time: 45 minutes - elevated troponin in the setting of NSTEMI  Critical care time was exclusive of separately billable procedures and treating other patients.  Critical care was necessary to treat or prevent imminent or life-threatening deterioration.  Critical care was time spent personally by me on the following activities: development of treatment plan with patient and/or surrogate as well as nursing, discussions with consultants, evaluation of patient's response to treatment, examination of patient, obtaining history from patient or surrogate, ordering and performing treatments and interventions, ordering and review of laboratory studies, ordering and review of radiographic studies, pulse oximetry and re-evaluation of patient's condition.    Medications Ordered in ED Medications - No data to display   Initial Impression / Assessment and Plan / ED Course  I have reviewed the triage vital signs and the nursing notes.  Pertinent labs & imaging results that were available during my care of the patient were reviewed by me and considered in my medical decision making (see chart for details).     77 y.o. male with a history of CHF with pacemaker, hypertension, CAD, diabetes who presents emergency department today for shortness of  breath.  Patient was recently admitted on October 4 to have a change of pacemakers due to worsening EF and dyspnea. Family reports he was not change of pacemaker  secondary to a "blockage".  He notes since discharge he has become progressively short of breath with wife reports he has been more dyspneic.  No reports chest pain.  Patient on arrival is satting at 95% on 2 L.  He denies home oxygen use.  He is dyspneic with respiration rates between 25-30.  He has mild tachycardia of the low 100's. He has mild edema of b/l lower extremities. I-stat tn elevated at 0.18. Concern for Ischemia vs Heart Failure. Will consult cardiology.  3:28 PM Secretary has repeated paged to cardiology.   3:31 PM Cardiology to see the patient.   EKG with ventricular paced rhythm.  No evidence of STEMI.  CBC reassuring.  Patient's creatinine has elevated since June when it was 1.22 and now 1.81 today.  4 days ago the patient's creatinine was 1.59.  Chest x-ray without any evidence of acute disease.  There is stable cardiomegaly.  No pulmonary edema, consolidation, effusion or pneumothorax.  Suspect N STEMI.  Will wait for cardiology's recommendations before heparinizing the patient.  Patient is currently on Eliquis.  Case signed out to Benedetto Goad, PA-C with plan for patient to be admitted.  Cardiology is to see the patient and provide recommendations.  Final Clinical Impressions(s) / ED Diagnoses   Final diagnoses:  NSTEMI (non-ST elevated myocardial infarction) ([place])  History of CHF (congestive heart failure)  Shortness of breath    ED Discharge Orders    None       Lorelle Gibbs 12/08/17 1602    Noemi Chapel, MD 12/09/17 [phone]

## 2017-12-08 NOTE — H&P (Signed)
Cardiology Admission History and Physical:   Patient ID: Ricky Barnett MRN: [id number]; DOB: [birth year]   Admission date: 12/08/2017  Primary Care Provider: Merrilee Seashore, MD Primary Cardiologist: Zhamir Pirro Primary Electrophysiologist:  taylor  Chief Complaint:  Dyspnea  Patient Profile:   Ricky Barnett is a 77 y.o. male with heart failure and severely depressed left ventricular systolic function, high-grade second-degree AV block requiring frequent ventricular pacing, [employer] dual-chamber pacemaker, paroxysmal atrial flutter and paroxysmal atrial fibrillation (asymptomatic detected by device), HTN.  He  He presents with several days of worsening dyspnea on exertion culminating in orthopnea and PND over the last couple of days  History of Present Illness:   Ricky Barnett has had worsening heart failure over the last several months, probably related to increased need for ventricular pacing with subsequent dyssynchrony.    Left ventricular systolic function decreased from 45-50% in 2014 to only 20% by echocardiogram performed November 17, 2017.  There was prominent pacing induced dyssynchrony on his echocardiogram and there was evidence of markedly elevated filling pressures as well as moderate to severe mitral regurgitation with a central jet.  Previous work-up for ischemic heart disease with a nuclear stress test in 2014 was negative.  He is scheduled to undergo a coronary CT angiogram but this has not yet been performed.  He underwent attempted upgrade to biventricular pacemaker with Dr. 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  He has not had any bleeding or signs of infection at the pacemaker site.  He has not had angina pectoris in the past over the last few days during his deteriorating breathing.  He does not weigh himself on a regular basis, despite being instructed to do so.  On August 29 his weight  was recorded at 220 pounds.  Today his weight is recorded at 245 pounds.  He has little if any ankle edema.  He denies palpitations and has not experienced syncope.  He has not had focal neurological events or bleeding problems, falls or injuries.  I am not sure if his anticoagulation was briefly interrupted for the attempted biV pacemaker upgrade.   Past Medical History:  Diagnosis Date  . Angina    just started recently ,2-3 days ago-went to see Dr. Einar Gip 05/14/2011  . Arthritis    bil knees  . Diabetes mellitus    was at one time lost weight amd no problem with it now. About 2-3 yrs ago  . Dysrhythmia    2-3 yrs ago  . Headache(784.0)    occas  . Hypertension   . Mastoiditis    right ear  . Pacemaker 11/29/2012   dual chamber  . Palpitations     Past Surgical History:  Procedure Laterality Date  . BIV UPGRADE N/A 12/04/2017   Procedure: Dual Chamber PPM upgrading to BIV PPM;  Surgeon: Evans Lance, MD;  Location: [place] [facility];  Service: Cardiovascular;  Laterality: N/A;  . CYSTECTOMY     under chin 1983  . FINGER SURGERY     states he cut finger off when cutting wood  . INSERT / REPLACE / REMOVE PACEMAKER  11/29/2012   [employer]   . NASAL SINUS SURGERY     left side  . PERMANENT PACEMAKER INSERTION N/A 11/29/2012   Procedure: PERMANENT PACEMAKER INSERTION;  Surgeon: Sanda Klein, MD;  Location: [place] [facility];  Service: Cardiovascular;  Laterality: N/A;  . TYMPANOPLASTY  05/19/2011  Procedure: TYMPANOPLASTY;  Surgeon: Thornell Sartorius, MD;  Location: [facility] OR;  Service: ENT;  Laterality: Right;  Exploratory Tympanotomy     Medications Prior to Admission: Prior to Admission medications   Medication Sig Start Date End Date Taking? Authorizing Provider  acetaminophen (TYLENOL) 325 MG tablet Take 1-2 tablets (325-650 mg total) by mouth every 4 (four) hours as needed. Patient taking differently: Take 325-650 mg by mouth every 4 (four) hours as needed (for pain.).   11/30/12  Yes Isaiah Serge, NP  apixaban (ELIQUIS) 5 MG TABS tablet Take 1 tablet (5 mg total) by mouth 2 (two) times daily. 10/29/17  Yes Charolette Bultman, MD  furosemide (LASIX) 40 MG tablet Take 1 tablet (40 mg total) by mouth daily. 04/23/17 12/01/18 Yes Kilroy, Doreene Burke, PA-C  metFORMIN (GLUCOPHAGE-XR) 750 MG 24 hr tablet Take 750 mg by mouth 2 (two) times daily.  10/15/12  Yes [provider]  metoprolol succinate (TOPROL-XL) 50 MG 24 hr tablet Take 50 mg by mouth daily. Take with or immediately following a meal.    Yes [provider]  Multiple Vitamin (MULTIVITAMIN) tablet Take 1 tablet by mouth daily.   Yes [provider]  potassium chloride SA (K-DUR,KLOR-CON) 20 MEQ tablet Take 1 tablet (20 mEq total) by mouth daily. 04/23/17 12/01/18 Yes Kilroy, Luke K, PA-C  pravastatin (PRAVACHOL) 40 MG tablet Take 1 tablet (40 mg total) by mouth daily. 07/13/17  Yes Nollie Terlizzi, MD  sacubitril-valsartan (ENTRESTO) 49-51 MG Take 1 tablet by mouth 2 (two) times daily. 11/23/17  Yes Treyten Monestime, MD  Tamsulosin HCl (FLOMAX) 0.4 MG CAPS Take 0.4 mg by mouth daily after supper.   Yes [provider]     Allergies:   No Known Allergies  Social History:   Social History   Socioeconomic History  . Marital status: Married    Spouse name: Not on file  . Number of children: Not on file  . Years of education: Not on file  . Highest education level: Not on file  Occupational History  . Not on file  Social Needs  . Financial resource strain: Not on file  . Food insecurity:    Worry: Not on file    Inability: Not on file  . Transportation needs:    Medical: Not on file    Non-medical: Not on file  Tobacco Use  . Smoking status: Never Smoker  . Smokeless tobacco: Never Used  Substance and Sexual Activity  . Alcohol use: No  . Drug use: No  . Sexual activity: Not on file  Lifestyle  . Physical activity:    Days per week: Not on file    Minutes per session:  Not on file  . Stress: Not on file  Relationships  . Social connections:    Talks on phone: Not on file    Gets together: Not on file    Attends religious service: Not on file    Active member of club or organization: Not on file    Attends meetings of clubs or organizations: Not on file    Relationship status: Not on file  . Intimate partner violence:    Fear of current or ex partner: Not on file    Emotionally abused: Not on file    Physically abused: Not on file    Forced sexual activity: Not on file  Other Topics Concern  . Not on file  Social History Narrative  . Not on file  Family History:   The patient's family history includes Heart attack in his father.    ROS:  Please see the history of present illness.  All other ROS reviewed and negative.     Physical Exam/Data:   Vitals:   12/08/17 1412 12/08/17 1500 12/08/17 1515 12/08/17 1645  BP: (!) 133/91   (!) 117/99  Pulse: (!) 102 96 (!) 108 99  Resp: (!) 24 (!) 26 20 (!) 23  Temp: 97.7 F (36.5 C)     TempSrc: Oral     SpO2: 95% 100% 100% 100%  Weight:      Height:       No intake or output data in the 24 hours ending 12/08/17 1817 Filed Weights   12/08/17 1409  Weight: 111.1 kg   Body mass index is 34.17 kg/m.  General:  Well nourished, well developed, he is very tachypnea HEENT: normal Lymph: no adenopathy Neck: 8 cm JVD Endocrine:  No thryomegaly Vascular: No carotid bruits; FA pulses 2+ bilaterally without bruits  Cardiac:  normal S1, split S2; RRR; 2/6 holosystolic murmur at the apex, no diastolic murmurs Lungs:  clear to auscultation bilaterally, no wheezing, rhonchi or rales  Abd: soft, nontender, no hepatomegaly  Ext: Trivial symmetrical ankle edema Musculoskeletal:  No deformities, BUE and BLE strength normal and equal Skin: warm and dry  Neuro:  CNs 2-12 intact, no focal abnormalities noted Psych:  Normal affect    EKG:  The ECG that was done today was personally reviewed and  demonstrates atrial sensed (sinus tachycardia), ventricular paced rhythm with very broad QRS 160 ms  Relevant CV Studies: Comprehensive interrogation of his pacemaker was performed in the emergency room.  His device is functioning normally.  The intracardiac electrograms confirm sinus tachycardia with ventricular tracking.  He has had occasional episodes labeled "PMT" which actually represent atrial flutter with undersensing flutter waves in the refractory period and failure to mode switch.  However these have been very brief he is in normal right now.  Counters showed less than 1% atrial pacing and 89% ventricular pacing.  All lead parameters are within normal range and expected battery longevity is 6 years.  Laboratory Data:  Chemistry Recent Labs  Lab 12/04/17 0949 12/08/17 1414  NA 139 140  K 3.9 4.5  CL 107 105  CO2 21* 19*  GLUCOSE 118* 148*  BUN 17 28*  CREATININE 1.59* 1.81*  CALCIUM 8.7* 8.9  GFRNONAA 40* 34*  GFRAA 46* 40*  ANIONGAP 11 16*    No results for input(s): PROT, ALBUMIN, AST, ALT, ALKPHOS, BILITOT in the last 168 hours. Hematology Recent Labs  Lab 12/04/17 0949 12/08/17 1414  WBC 5.8 8.7  RBC 4.92 5.47  HGB 12.8* 13.9  HCT 41.2 46.3  MCV 83.7 84.6  MCH 26.0 25.4*  MCHC 31.1 30.0  RDW 15.9* 17.6*  PLT 294 364   Cardiac EnzymesNo results for input(s): TROPONINI in the last 168 hours.  Recent Labs  Lab 12/08/17 1423  TROPIPOC 0.18*    BNP Recent Labs  Lab 12/08/17 1536  BNP 3,398.8*    DDimer No results for input(s): DDIMER in the last 168 hours.  Radiology/Studies:  Dg Chest 1 View  Result Date: 12/08/2017 CLINICAL DATA:  Shortness of breath. EXAM: CHEST  1 VIEW COMPARISON:  12/04/2017 FINDINGS: Dual-chamber pacer leads from the left are in stable position. Chronic cardiomegaly and hilar vessel prominence. There is no edema, consolidation, effusion, or pneumothorax. IMPRESSION: 1. No evidence of active disease. 2.  Cardiomegaly.  Electronically Signed   By: Monte Fantasia M.D.   On: 12/08/2017 15:09    Assessment and Plan:   1. CHF (acute on chronic combined systolic and diastolic): He has signs and symptoms of acute left heart failure exacerbation with orthopnea.  If the weight today is accurately recorded 25 pounds above the weight recorded at his office visit in late August when he was also probably slightly "wet".  Requires intravenous diuretics.  Decompensation may be related to temporary interruption of diuretics for the recent procedure, administration of intravenous fluids (since he was receiving contrast), maybe even a component of contrast nephrotoxicity although he probably received a very small amount of intravenous iodinated contrast.  Need to reeducate him on the importance of daily weight monitoring.  He is compliant with sodium restriction. 2. CMP: By far the most likely reason for progression of his cardiomyopathy is the increased frequency of ventricular pacing.  He has not however had a recent evaluation for coronary insufficiency since his stress test in 2014.  A coronary CT angiogram has been scheduled but not yet with his current elevation in creatinine we will switch to a [employer]. 3. PAFib: Both atrial flutter and atrial fibrillation have been recorded by his pacemaker.  These were asymptomatic.  There was occasional failure to "mode switch" due to flutter waves in the refractory period.  Keep on telemetry while in the hospital and adjust pacemaker settings if necessary. 4. PPM: Otherwise normal device function.  He would really benefit from cardiac resynchronization but unfortunately his left subclavian vein is less other options with Dr. Lovena Le.  Unfortunately placement of a coronary sinus lead will be much more challenging from the right side and his entire pacing system would have to be relocated. 5. AKI: Fully the alteration in his renal function is entirely due to congestive heart failure  exacerbation and is hemodynamically mediated, but cannot exclude a component of contrast nephrotoxicity.  Recheck labs in the morning.  For the time being I think it is in his benefit to continue the [facility]. Hold metformin.  Severity of Illness: The appropriate patient status for this patient is INPATIENT. Inpatient status is judged to be reasonable and necessary in order to provide the required intensity of service to ensure the patient's safety. The patient's presenting symptoms, physical exam findings, and initial radiographic and laboratory data in the context of their chronic comorbidities is felt to place them at high risk for further clinical deterioration. Furthermore, it is not anticipated that the patient will be medically stable for discharge from the hospital within 2 midnights of admission. The following factors support the patient status of inpatient.   " The patient's presenting symptoms include dyspnea at rest, orthopnea, PND. " The worrisome physical exam findings include weight gain, elevated jugular venous pulsations, tachypnea. " The initial radiographic and laboratory data are worrisome because of worsening renal function acutely elevated BNP, much higher than baseline. " The chronic co-morbidities include second-degree AV block, advanced age, paroxysmal atrial fibrillation.   * I certify that at the point of admission it is my clinical judgment that the patient will require inpatient hospital care spanning beyond 2 midnights from the point of admission due to high intensity of service, high risk for further deterioration and high frequency of surveillance required.*    For questions or updates, please contact [place] Please consult [email or web address] for contact info under        Signed, Sanda Klein, MD  12/08/2017 6:17 PM

## 2017-12-09 LAB — BASIC METABOLIC PANEL WITH GFR
Anion gap: 13 (ref 5–15)
BUN: 29 mg/dL — ABNORMAL HIGH (ref 8–23)
CO2: 22 mmol/L (ref 22–32)
Calcium: 8.3 mg/dL — ABNORMAL LOW (ref 8.9–10.3)
Chloride: 104 mmol/L (ref 98–111)
Creatinine, Ser: 1.81 mg/dL — ABNORMAL HIGH (ref 0.61–1.24)
GFR calc Af Amer: 40 mL/min — ABNORMAL LOW
GFR calc non Af Amer: 34 mL/min — ABNORMAL LOW
Glucose, Bld: 106 mg/dL — ABNORMAL HIGH (ref 70–99)
Potassium: 4.4 mmol/L (ref 3.5–5.1)
Sodium: 139 mmol/L (ref 135–145)

## 2017-12-09 MED ORDER — TRAZODONE HCL 50 MG PO TABS
25.0000 mg | ORAL_TABLET | Freq: Once | ORAL | Status: AC
  Administered 2017-12-09: 25 mg via ORAL
  Filled 2017-12-09: qty 1

## 2017-12-09 MED ORDER — ORAL CARE MOUTH RINSE
15.0000 mL | Freq: Two times a day (BID) | OROMUCOSAL | Status: DC
  Administered 2017-12-09 – 2017-12-10 (×2): 15 mL via OROMUCOSAL

## 2017-12-09 MED ORDER — MUPIROCIN 2 % EX OINT
1.0000 "application " | TOPICAL_OINTMENT | Freq: Two times a day (BID) | CUTANEOUS | Status: DC
  Administered 2017-12-09 – 2017-12-10 (×3): 1 via NASAL
  Filled 2017-12-09 (×2): qty 22

## 2017-12-09 MED ORDER — CHLORHEXIDINE GLUCONATE CLOTH 2 % EX PADS
6.0000 | MEDICATED_PAD | Freq: Every day | CUTANEOUS | Status: DC
  Administered 2017-12-10: 6 via TOPICAL

## 2017-12-09 NOTE — Care Management Note (Signed)
Case Management Note  Patient Details  Name: Ricky Barnett MRN: [id number] Date of Birth: 10-30-1940  Subjective/Objective:  CHF                 Action/Plan: Patient lives at home with spouse; Primary Care Provider: Merrilee Seashore, MD; has private insurance with [facility] Medicare with prescription drug coverage; CM following for progression of care.  Expected Discharge Date:  Possibly 12/12/2017             Expected Discharge Plan:  [name] possibly  Discharge planning Services  CM Consult  Status of Service:  In process, will continue to follow  [name] [phone] 12/09/2017, 2:22 PM

## 2017-12-09 NOTE — Plan of Care (Signed)
  Problem: Activity: Goal: Risk for activity intolerance will decrease Outcome: Progressing   Problem: Elimination: Goal: Will not experience complications related to bowel motility Outcome: Progressing   

## 2017-12-09 NOTE — Progress Notes (Signed)
PT. Arrived to the unit from ED in alert and stable condition. Pt. Oriented to room and placed on telemetry. CCMD notified of pts. Arrival to the unit. Call light placed within reach. Family at pts. Bedside. RN will continue to monitor pt. For changes in condition. Danell Verno, Katherine Roan

## 2017-12-09 NOTE — Progress Notes (Signed)
Progress Note  Patient Name: Ricky Barnett Date of Encounter: 12/09/2017  Primary Cardiologist: Wes Lezotte  Subjective   Feeling much better, able to sleep some last night.  Good appetite this morning. Reports about a gallon of diuresis last evening, but this was not charted since he had not yet been admitted to the floor unit.  Denies dizziness  Inpatient Medications    Scheduled Meds: . apixaban  5 mg Oral BID  . Chlorhexidine Gluconate Cloth  6 each Topical Q0600  . furosemide  40 mg Intravenous BID  . mouth rinse  15 mL Mouth Rinse BID  . metoprolol succinate  50 mg Oral Daily  . mupirocin ointment  1 application Nasal BID  . potassium chloride SA  20 mEq Oral Daily  . pravastatin  40 mg Oral Daily  . sacubitril-valsartan  1 tablet Oral BID  . tamsulosin  0.4 mg Oral QPC supper   Continuous Infusions:  PRN Meds: acetaminophen   Vital Signs    Vitals:   12/08/17 1930 12/08/17 2124 12/09/17 0012 12/09/17 0310  BP: 138/81 (!) 127/94 (!) 143/89 (!) 141/111  Pulse: 92 (!) 101 (!) 101 90  Resp: 15 20 18 18   Temp:  98.2 F (36.8 C) 97.7 F (36.5 C)   TempSrc:  Oral Oral   SpO2: 99% 100% 96% 100%  Weight:  103.1 kg  102.4 kg  Height:  5\' 11"  (1.803 m)      Intake/Output Summary (Last 24 hours) at 12/09/2017 1029 Last data filed at 12/09/2017 1009 Gross per 24 hour  Intake 480 ml  Output 450 ml  Net 30 ml   Filed Weights   12/08/17 1409 12/08/17 2124 12/09/17 0310  Weight: 111.1 kg 103.1 kg 102.4 kg    Telemetry    Atrial sensed ventricular paced rhythm- Personally Reviewed  ECG    No new tracing- Personally Reviewed  Physical Exam  Smiling and much less tachypneic than yesterday GEN: No acute distress.   Neck:  Jugular venous pulsations roughly 8 cm above the sternal angle Cardiac: RRR, no diastolic murmurs, rubs, or gallops.  Paradoxically split S2 .  7-3/2 holosystolic apical murmur Respiratory: Clear to auscultation bilaterally. GI: Soft,  nontender, non-distended  MS:  1+ symmetrical calf edema; No deformity. Neuro:  Nonfocal  Psych: Normal affect   Labs    Chemistry Recent Labs  Lab 12/04/17 0949 12/08/17 1414 12/09/17 0503  NA 139 140 139  K 3.9 4.5 4.4  CL 107 105 104  CO2 21* 19* 22  GLUCOSE 118* 148* 106*  BUN 17 28* 29*  CREATININE 1.59* 1.81* 1.81*  CALCIUM 8.7* 8.9 8.3*  GFRNONAA 40* 34* 34*  GFRAA 46* 40* 40*  ANIONGAP 11 16* 13     Hematology Recent Labs  Lab 12/04/17 0949 12/08/17 1414  WBC 5.8 8.7  RBC 4.92 5.47  HGB 12.8* 13.9  HCT 41.2 46.3  MCV 83.7 84.6  MCH 26.0 25.4*  MCHC 31.1 30.0  RDW 15.9* 17.6*  PLT 294 364    Cardiac EnzymesNo results for input(s): TROPONINI in the last 168 hours.  Recent Labs  Lab 12/08/17 1423  TROPIPOC 0.18*     BNP Recent Labs  Lab 12/08/17 1536  BNP 3,398.8*     DDimer No results for input(s): DDIMER in the last 168 hours.   Radiology    Dg Chest 1 View  Result Date: 12/08/2017 CLINICAL DATA:  Shortness of breath. EXAM: CHEST  1 VIEW COMPARISON:  12/04/2017  FINDINGS: Dual-chamber pacer leads from the left are in stable position. Chronic cardiomegaly and hilar vessel prominence. There is no edema, consolidation, effusion, or pneumothorax. IMPRESSION: 1. No evidence of active disease. 2. Cardiomegaly. Electronically Signed   By: Monte Fantasia M.D.   On: 12/08/2017 15:09    Cardiac Studies   11/17/2017 echo - Left ventricle: The cavity size was mildly dilated. Wall   thickness was increased in a pattern of mild LVH. The estimated   ejection fraction was 20%. Diffuse hypokinesis. Septal-lateral   dyssynchrony. Features are consistent with a pseudonormal left   ventricular filling pattern, with concomitant abnormal relaxation   and increased filling pressure (grade 2 diastolic dysfunction).   E/medial e&' > 15, suggesting LV end diastolic pressure at least   20 mmHg. - Aortic valve: There was no stenosis. There was mild to moderate    regurgitation. - Mitral valve: There was moderate to severe regurgitation. Central   MR, suspect functional. - Left atrium: The atrium was moderately dilated. - Right ventricle: Pacer wire or catheter noted in right ventricle.   Systolic function was mildly to moderately reduced. - Right atrium: The atrium was mildly dilated. - Tricuspid valve: Peak RV-RA gradient (S): 69 mm Hg. - Pulmonary arteries: PA peak pressure: 84 mm Hg (S). - Systemic veins: IVC measured 2.7 cm with < 50% respirophasic   variation, suggesting RA pressure 15 mmHg.  Impressions:  - Mildly dilated LV with mild LV hypertrophy. EF 20%, diffuse   hypokinesis with septal-lateral dyssynchrony. Moderate diastolic   dysfunction with evidence for elevated LV filling pressure.   Normal RV size with mild-moderately decreased systolic function.   Dilated IVC suggesting elevated RV filling pressure.   Moderate-severe MR, suspect functional. Severe pulmonary   hypertension.  Patient Profile     77 y.o. male with chronic combined systolic and diastolic heart failure, recent deterioration LVEF possibly related to increased ventricular pacing, dual-chamber [employer] pacemaker implanted for symptomatic second-degree AV block, asymptomatic paroxysmal atrial fibrillation (detected by pacemaker), essential hypertension, admitted with acute heart failure exacerbation and roughly 20-25 pound weight gain  Assessment & Plan    1.CHF: Not sure about exact weight prior to initiation of intravenous diuretics, but he has dropped down to roughly 226 pounds today, still 6 pounds higher than his weight in late August.  Despite what appears to be impressive diuresis there has been no worsening of renal function.  Electrolytes remain normal.  Continue IV diuretics.  Once under 220 pounds transition to p.o. diuretics and then observe for another 24 hours.  He is on Entresto and beta-blockers.  If blood pressure and creatinine allows we will  try to increase to the maximum Entresto dose at the time of discharge.  Still needs to reevaluate for possible interim development of CAD as cause of cardiomyopathy, although pacing induced dyssynchrony appears to be more likely. 2.  Pacing induced dyssynchrony: Failed attempt at CRT due to occlusion of the left subclavian vein.  We will discuss further options for therapy with Dr. Lovena Le.  For the time being have prolonged the AV delay to maximum possible to reduce the frequency of ventricular pacing, currently at 90%.  To achieve CRT, he will have to have an entirely new system implanted from the right side, technically more challenging. 3. AKI: Creatinine is stable despite diuresis.  This makes contrast related nephrotoxicity unlikely.  Continue to monitor.  Not a good candidate for coronary CT angiography because of persistently abnormal renal function, scheduled  for a [employer]. 4. PAFib: None detected by pacemaker since his last office visit in late August.  He is on Eliquis.  The dose still appears appropriate, but in just a couple of years he would qualify for the reduced dose of Eliquis.  For questions or updates, please contact [place] Please consult [email or web address] for contact info under        Signed, Sanda Klein, MD  12/09/2017, 10:29 AM

## 2017-12-09 NOTE — Plan of Care (Signed)
?  Problem: Coping: ?Goal: Level of anxiety will decrease ?Outcome: Progressing ?  ?Problem: Safety: ?Goal: Ability to remain free from injury will improve ?Outcome: Progressing ?  ?

## 2017-12-10 ENCOUNTER — Other Ambulatory Visit: Payer: Self-pay

## 2017-12-10 ENCOUNTER — Inpatient Hospital Stay (HOSPITAL_COMMUNITY): Payer: Medicare HMO

## 2017-12-10 ENCOUNTER — Telehealth: Payer: Self-pay | Admitting: Physician Assistant

## 2017-12-10 DIAGNOSIS — I509 Heart failure, unspecified: Secondary | ICD-10-CM

## 2017-12-10 LAB — BASIC METABOLIC PANEL
Anion gap: 10 (ref 5–15)
BUN: 22 mg/dL (ref 8–23)
CALCIUM: 8.4 mg/dL — AB (ref 8.9–10.3)
CO2: 26 mmol/L (ref 22–32)
CREATININE: 1.55 mg/dL — AB (ref 0.61–1.24)
Chloride: 106 mmol/L (ref 98–111)
GFR calc Af Amer: 48 mL/min — ABNORMAL LOW (ref >60)
GFR, EST NON AFRICAN AMERICAN: 41 mL/min — AB (ref >60)
GLUCOSE: 114 mg/dL — AB (ref 70–99)
Potassium: 3.5 mmol/L (ref 3.5–5.1)
Sodium: 142 mmol/L (ref 135–145)

## 2017-12-10 LAB — NM MYOCAR MULTI W/SPECT W/WALL MOTION / EF
CHL CUP RESTING HR STRESS: 90 {beats}/min
CSEPED: 5 min
CSEPHR: 69 %
Estimated workload: 1 METS
Exercise duration (sec): 14 s
MPHR: 142 {beats}/min
Peak HR: 98 {beats}/min

## 2017-12-10 MED ORDER — FUROSEMIDE 80 MG PO TABS
80.0000 mg | ORAL_TABLET | Freq: Every day | ORAL | Status: DC
  Administered 2017-12-10: 80 mg via ORAL
  Filled 2017-12-10: qty 1

## 2017-12-10 MED ORDER — REGADENOSON 0.4 MG/5ML IV SOLN
INTRAVENOUS | Status: AC
  Administered 2017-12-10: 12:00:00
  Filled 2017-12-10: qty 5

## 2017-12-10 MED ORDER — SACUBITRIL-VALSARTAN 97-103 MG PO TABS: 1.0000 | ORAL_TABLET | Freq: Two times a day (BID) | ORAL | 3 refills | Status: DC

## 2017-12-10 MED ORDER — REGADENOSON 0.4 MG/5ML IV SOLN
0.4000 mg | Freq: Once | INTRAVENOUS | Status: AC
  Administered 2017-12-10: 0.4 mg via INTRAVENOUS

## 2017-12-10 MED ORDER — FUROSEMIDE 80 MG PO TABS: 80.0000 mg | ORAL_TABLET | Freq: Every day | ORAL | 3 refills | Status: DC

## 2017-12-10 MED ORDER — TECHNETIUM TC 99M TETROFOSMIN IV KIT
30.0000 | PACK | Freq: Once | INTRAVENOUS | Status: AC | PRN
  Administered 2017-12-10: 30 via INTRAVENOUS

## 2017-12-10 MED ORDER — SACUBITRIL-VALSARTAN 97-103 MG PO TABS
1.0000 | ORAL_TABLET | Freq: Two times a day (BID) | ORAL | Status: DC
  Administered 2017-12-10: 1 via ORAL
  Filled 2017-12-10: qty 1

## 2017-12-10 MED ORDER — TECHNETIUM TC 99M TETROFOSMIN IV KIT
10.0000 | PACK | Freq: Once | INTRAVENOUS | Status: AC | PRN
  Administered 2017-12-10: 10 via INTRAVENOUS

## 2017-12-10 NOTE — Progress Notes (Signed)
Patient is disoriented and confused. When try to reorient patient, he becomes agitated and impulsive. Will continue to monitor patient.

## 2017-12-10 NOTE — Telephone Encounter (Signed)
New Message   Patient has a  TOC with Almyra Deforest on 10/24

## 2017-12-10 NOTE — Telephone Encounter (Signed)
Currently admitted.

## 2017-12-10 NOTE — Progress Notes (Addendum)
Progress Note  Patient Name: Ricky Barnett Date of Encounter: 12/10/2017  Primary Cardiologist: Dr. Sallyanne Kuster  Subjective   Seen in nuclear medicine for stress test. Pt confused.   Inpatient Medications    Scheduled Meds: . apixaban  5 mg Oral BID  . Chlorhexidine Gluconate Cloth  6 each Topical Q0600  . furosemide  80 mg Oral Daily  . mouth rinse  15 mL Mouth Rinse BID  . metoprolol succinate  50 mg Oral Daily  . mupirocin ointment  1 application Nasal BID  . potassium chloride SA  20 mEq Oral Daily  . pravastatin  40 mg Oral Daily  . regadenoson      . sacubitril-valsartan  1 tablet Oral BID  . tamsulosin  0.4 mg Oral QPC supper   Continuous Infusions:  PRN Meds: acetaminophen   Vital Signs    Vitals:   12/10/17 0852 12/10/17 0947 12/10/17 0949 12/10/17 0951  BP: (!) 157/89 (!) 141/101 (!) 165/98 (!) 157/96  Pulse: 90 97 92 93  Resp:      Temp:      TempSrc:      SpO2:      Weight:      Height:        Intake/Output Summary (Last 24 hours) at 12/10/2017 0952 Last data filed at 12/10/2017 0452 Gross per 24 hour  Intake 720 ml  Output 425 ml  Net 295 ml   Filed Weights   12/08/17 2124 12/09/17 0310 12/10/17 0453  Weight: 103.1 kg 102.4 kg 99.9 kg    Physical Exam   General: Well developed, well nourished, NAD Skin: Warm, dry, intact  Head: Normocephalic, atraumatic,  clear, moist mucus membranes. Neck: Negative for carotid bruits. No JVD Lungs:Clear to ausculation bilaterally. No wheezes, rales, or rhonchi. Breathing is unlabored. Cardiovascular: RRR with S1 S2. No murmurs, rubs, gallops, or LV heave appreciated. Abdomen: Soft, non-tender, non-distended with normoactive bowel sounds. No obvious abdominal masses. MSK: Strength and tone appear normal for age. 5/5 in all extremities Extremities: No edema. No clubbing or cyanosis. DP/PT pulses 2+ bilaterally Neuro: Confused. No focal deficits. No facial asymmetry. MAE spontaneously. Psych:  Responds to questions somewhat appropriately with normal affect.   Labs    Chemistry Recent Labs  Lab 12/08/17 1414 12/09/17 0503 12/10/17 0524  NA 140 139 142  K 4.5 4.4 3.5  CL 105 104 106  CO2 19* 22 26  GLUCOSE 148* 106* 114*  BUN 28* 29* 22  CREATININE 1.81* 1.81* 1.55*  CALCIUM 8.9 8.3* 8.4*  GFRNONAA 34* 34* 41*  GFRAA 40* 40* 48*  ANIONGAP 16* 13 10     Hematology Recent Labs  Lab 12/04/17 0949 12/08/17 1414  WBC 5.8 8.7  RBC 4.92 5.47  HGB 12.8* 13.9  HCT 41.2 46.3  MCV 83.7 84.6  MCH 26.0 25.4*  MCHC 31.1 30.0  RDW 15.9* 17.6*  PLT 294 364    Cardiac EnzymesNo results for input(s): TROPONINI in the last 168 hours.  Recent Labs  Lab 12/08/17 1423  TROPIPOC 0.18*     BNP Recent Labs  Lab 12/08/17 1536  BNP 3,398.8*     DDimer No results for input(s): DDIMER in the last 168 hours.   Radiology    Dg Chest 1 View  Result Date: 12/08/2017 CLINICAL DATA:  Shortness of breath. EXAM: CHEST  1 VIEW COMPARISON:  12/04/2017 FINDINGS: Dual-chamber pacer leads from the left are in stable position. Chronic cardiomegaly and hilar vessel prominence.  There is no edema, consolidation, effusion, or pneumothorax. IMPRESSION: 1. No evidence of active disease. 2. Cardiomegaly. Electronically Signed   By: Monte Fantasia M.D.   On: 12/08/2017 15:09   Telemetry    NSR PPm in place- Personally Reviewed  ECG    No new tracing as of 12/10/17 - Personally Reviewed  Cardiac Studies   Lexiscan Stress Test: 12/10/17 Pending   11/17/2017 echo - Left ventricle: The cavity size was mildly dilated. Wall thickness was increased in a pattern of mild LVH. The estimated ejection fraction was 20%. Diffuse hypokinesis. Septal-lateral dyssynchrony. Features are consistent with a pseudonormal left ventricular filling pattern, with concomitant abnormal relaxation and increased filling pressure (grade 2 diastolic dysfunction). E/medial e&' >15, suggesting LV  end diastolic pressure at least 20 mmHg. - Aortic valve: There was no stenosis. There was mild to moderate regurgitation. - Mitral valve: There was moderate to severe regurgitation. Central MR, suspect functional. - Left atrium: The atrium was moderately dilated. - Right ventricle: Pacer wire or catheter noted in right ventricle. Systolic function was mildly to moderately reduced. - Right atrium: The atrium was mildly dilated. - Tricuspid valve: Peak RV-RA gradient (S): 69 mm Hg. - Pulmonary arteries: PA peak pressure: 84 mm Hg (S). - Systemic veins: IVC measured 2.7 cm with < 50% respirophasic variation, suggesting RA pressure 15 mmHg.  Impressions:  - Mildly dilated LV with mild LV hypertrophy. EF 20%, diffuse hypokinesis with septal-lateral dyssynchrony. Moderate diastolic dysfunction with evidence for elevated LV filling pressure. Normal RV size with mild-moderately decreased systolic function. Dilated IVC suggesting elevated RV filling pressure. Moderate-severe MR, suspect functional. Severe pulmonary hypertension.  Patient Profile     77 y.o. male with chronic combined systolic and diastolic heart failure, recent deterioration LVEF possibly related to increased ventricular pacing, dual-chamber [employer] pacemaker implanted for symptomatic second-degree AV block, asymptomatic paroxysmal atrial fibrillation (detected by pacemaker), essential hypertension, admitted with acute heart failure exacerbation and roughly 20-25 pound weight gain  Assessment & Plan    1. CHF: -Does not appear to be significantly fluid volume overloaded -Weight, 220lb today>>>245lb on admission  -I&O, net positive 88ml today  -Continue IV diuretics -Possible Entresto candidate>>>depending on stress test outcome>>CAD induced cardiomyopathy?   2. Pacing induced dyssynchrony: -failed attempt at CRT>>>plan to be dicussed with Dr. Lovena Le per chart review   3.  AKI: -Creatinine, 1.55 today>>>down from 1.81 yesterday  -Baseline appears to be in the 1.5 range>>remote baseline 1.0 2014 -Avoid nephrotoxic meds   4. PAF: -None detected by PPM since last office visit in 10/2017  -Continue Eliquis    Signed, Kathyrn Drown NP-C [facility] Pager: [phone] 12/10/2017, 9:52 AM     For questions or updates, please contact   Please consult [email or web address] for contact info under Cardiology/STEMI.  I have seen and examined the patient along with Kathyrn Drown NP-C.  I have reviewed the chart, notes and new data.  I agree with PA/NP's note.  Key new complaints: I do not think he is confused (or at least not beyond his usual mild cognitive deficits). He jokes a lot to cover this up. Key examination changes: appears clinically euvolemic. Weight back to his August weight Key new findings / data: creat improved with diuresis to 1.55, K 3.5  Nuclear scan shows "fixed inferior defect", no reversible ischemia. I am not sure whether the inferior defect is not diaphragmatic attenuation  PLAN: Increased Entresto to max dose. I do not see a reason to do heart cath. He  does not have angina. No reversible defects. Risk for contrast nephrotoxicity. DC home on oral diuretics with repeat BMET when he comes in for INR check. Stressed importance of daily weights. He should call us if weight strays more than 3 lb from current weight, either up or down. He is at risk for both CHF exacerbation and worsened renal dysfunction. Early TOC visit after labs are checked. Will review options for CRT (lead extraction? New R subclavian system?) with EP.  Sanda Klein, MD, [place] [phone] 12/10/2017, 2:02 PM

## 2017-12-10 NOTE — Consult Note (Signed)
   [facility] CM Inpatient Consult   12/10/2017  Ricky Barnett 07/24/1940 [id number]  Patient evaluated for community based chronic disease management services with [facility] as a benefit of patient's [facility] as patient had been previously outreached by [facility] Pharmacist, [facility] Social Worker and [facility] Psychologist, clinical . Spoke with patient, Ricky Barnett states date of birth but somewhat confused but articulating about insurance plan and transportation issues] and wife, Ricky Barnett,  at bedside to explain [facility] services. They state they had moved back from [place] this year back to [place].  Patient and wife state they are having difficulty with transportation to appointments and getting food.  Wife states that have used rides from "[place] but we have to call four days ahead and a couple of times no one showed up."  Explained that a [facility] social worker can follow up on transportation and meals.  Consent form signed by wife and Ricky Barnett agreed.   Folder with information provided.  Patient will receive post hospital discharge call and will be evaluated for monthly home visits for assessments and disease process education.  Of note, [facility] Care Management services does not replace or interfere with any services that are arranged by inpatient case management or social work.  For additional questions or referrals please contact:     Ricky Brood, RN BSN [facility] Liaison  [phone] business mobile phone Toll free office [phone]

## 2017-12-10 NOTE — Plan of Care (Signed)
  Problem: Education: °Goal: Knowledge of General Education information will improve °Description: Including pain rating scale, medication(s)/side effects and non-pharmacologic comfort measures °Outcome: Progressing °  °Problem: Clinical Measurements: °Goal: Respiratory complications will improve °Outcome: Progressing °Goal: Cardiovascular complication will be avoided °Outcome: Progressing °  °Problem: Elimination: °Goal: Will not experience complications related to bowel motility °Outcome: Progressing °Goal: Will not experience complications related to urinary retention °Outcome: Progressing °  °

## 2017-12-10 NOTE — Discharge Summary (Signed)
Discharge Summary    Patient ID: Ricky Barnett,  MRN: [id number], DOB/AGE: [birth year] 77 y.o.  Admit date: 12/08/2017 Discharge date: 12/10/2017  Primary Care Provider: Merrilee Seashore Primary Cardiologist: Sanda Klein, MD  Discharge Diagnoses    Principal Problem:   Acute on chronic combined systolic (congestive) and diastolic (congestive) heart failure ([facility]) Active Problems:   Essential hypertension   Dyslipidemia   Non-insulin dependent type 2 diabetes mellitus ([place])   Pacemaker   Paroxysmal atrial fibrillation ([place])   Allergies No Known Allergies  Diagnostic Studies/Procedures    Nuclear stress test: 12/10/17: IMPRESSION: 1. Large region of remote prior infarct or scarring in the inferior wall extending into the inferoseptal and inferolateral walls from the base to the apex. No evidence of reversible ischemia.  2. Marked left ventricular dilatation and severe global hypokinesis.  3. Left ventricular ejection fraction 21%  4. Non invasive risk stratification*: High _____________   History of Present Illness     Ricky Barnett has had worsening heart failure over the last several months, probably related to increased need for ventricular pacing with subsequent dyssynchrony.    Left ventricular systolic function decreased from 45-50% in 2014 to only 20% by echocardiogram performed November 17, 2017.  There was prominent pacing induced dyssynchrony on his echocardiogram and there was evidence of markedly elevated filling pressures as well as moderate to severe mitral regurgitation with a central jet.  Previous work-up for ischemic heart disease with a nuclear stress test in 2014 was negative.  He is scheduled to undergo a coronary CT angiogram but this has not yet been performed.  He underwent attempted upgrade to biventricular pacemaker with Dr. 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  He has not had any bleeding or signs of infection at the pacemaker site.  He has not had angina pectoris in the past over the last few days during his deteriorating breathing.  He does not weigh himself on a regular basis, despite being instructed to do so.  On August 29 his weight was recorded at 220 pounds.  Today his weight is recorded at 245 pounds.  He has little if any ankle edema.  He denies palpitations and has not experienced syncope.  He has not had focal neurological events or bleeding problems, falls or injuries.  I am not sure if his anticoagulation was briefly interrupted for the attempted biV pacemaker upgrade.  Hospital Course     Consultants: None   1. Acute on chronic combined CHF: patient presented with SOB and weight gain. Found to be roughly 20-25lbs over dry weight. BNP was >3300. CXR without edema. He was diuresed with IV lasix and transitioned to 80mg  po at discharge. Home entresto was increased to 97-103mg  BID. Weight down to dry weight of 220lbs on the day of discharge. Cr remained stable throughout admission - 1.55 on the day of discharge. - Continue lasix, metoprolol XL, and entresto  2. Pacing induced dyssynchrony: prior failed attempt at CRT due to occlusion of the left subclavian vein. For the time being have prolonged the AV delay to maximum possible to reduce the frequency of ventricular pacing, currently at 90%.  To achieve CRT, he will have to have an entirely new system implanted from the right side, technically more challenging. - Continue outpatient follow-up with Dr. Lovena Le for further management  3. Paroxysmal atrial fibrillation: none detected by pacemaker since 10/2017. On Eliquis for stroke prevention  and metoprolol XL for rate control - Continue eliquis and metoprolol XL.   4. Acute on chronic renal insufficiency: Cr bumped to 1.8 on admission; improved to 1.55 on the day of discharge  - Continue to monitor Cr outpatient with ongoing  adjustments of medications.  _____________  Discharge Vitals Blood pressure 134/82, pulse 91, temperature 98.4 F (36.9 C), temperature source Oral, resp. rate 16, height 5\' 11"  (1.803 m), weight 99.9 kg, SpO2 99 %.  Filed Weights   12/08/17 2124 12/09/17 0310 12/10/17 0453  Weight: 103.1 kg 102.4 kg 99.9 kg    Labs & Radiologic Studies    CBC Recent Labs    12/08/17 1414  WBC 8.7  HGB 13.9  HCT 46.3  MCV 84.6  PLT 732   Basic Metabolic Panel Recent Labs    12/09/17 0503 12/10/17 0524  NA 139 142  K 4.4 3.5  CL 104 106  CO2 22 26  GLUCOSE 106* 114*  BUN 29* 22  CREATININE 1.81* 1.55*  CALCIUM 8.3* 8.4*   Liver Function Tests No results for input(s): AST, ALT, ALKPHOS, BILITOT, PROT, ALBUMIN in the last 72 hours. No results for input(s): LIPASE, AMYLASE in the last 72 hours. Cardiac Enzymes No results for input(s): CKTOTAL, CKMB, CKMBINDEX, TROPONINI in the last 72 hours. BNP Invalid input(s): POCBNP D-Dimer No results for input(s): DDIMER in the last 72 hours. Hemoglobin A1C No results for input(s): HGBA1C in the last 72 hours. Fasting Lipid Panel No results for input(s): CHOL, HDL, LDLCALC, TRIG, CHOLHDL, LDLDIRECT in the last 72 hours. Thyroid Function Tests No results for input(s): TSH, T4TOTAL, T3FREE, THYROIDAB in the last 72 hours.  Invalid input(s): FREET3 _____________  Dg Chest 1 View  Result Date: 12/08/2017 CLINICAL DATA:  Shortness of breath. EXAM: CHEST  1 VIEW COMPARISON:  12/04/2017 FINDINGS: Dual-chamber pacer leads from the left are in stable position. Chronic cardiomegaly and hilar vessel prominence. There is no edema, consolidation, effusion, or pneumothorax. IMPRESSION: 1. No evidence of active disease. 2. Cardiomegaly. Electronically Signed   By: Monte Fantasia M.D.   On: 12/08/2017 15:09   Nm Myocar Multi W/spect W/wall Motion / Ef  Result Date: 12/10/2017 CLINICAL DATA:  77 year old male with new onset heart failure. No previously  known coronary artery disease. EXAM: MYOCARDIAL IMAGING WITH SPECT (REST AND PHARMACOLOGIC-STRESS) GATED LEFT VENTRICULAR WALL MOTION STUDY LEFT VENTRICULAR EJECTION FRACTION TECHNIQUE: Standard myocardial SPECT imaging was performed after resting intravenous injection of 10 mCi Tc-58m tetrofosmin. Subsequently, intravenous infusion of Lexiscan was performed under the supervision of the Cardiology staff. At peak effect of the drug, 30 mCi Tc-23m tetrofosmin was injected intravenously and standard myocardial SPECT imaging was performed. Quantitative gated imaging was also performed to evaluate left ventricular wall motion, and estimate left ventricular ejection fraction. COMPARISON:  Prior chest x-ray 12/08/2017 FINDINGS: Perfusion: Large region of decreased radiotracer uptake both at stress and rest throughout the inferior, inferolateral and inferoseptal walls extending from the base to the ventricular apex. No evidence of reversible ischemia. Wall Motion: Marked left ventricular dilatation and severe global hypokinesis. Left Ventricular Ejection Fraction: 21 % End diastolic volume 202 ml End systolic volume 542 ml IMPRESSION: 1. Large region of remote prior infarct or scarring in the inferior wall extending into the inferoseptal and inferolateral walls from the base to the apex. No evidence of reversible ischemia. 2. Marked left ventricular dilatation and severe global hypokinesis. 3. Left ventricular ejection fraction 21% 4. Non invasive risk stratification*: High *2012 Appropriate Use Criteria for  Coronary Revascularization Focused Update: J Am Coll Cardiol. 1610;96(0):454-098. http://content.airportbarriers.com.aspx?articleid=[email or web address] Electronically Signed   By: Jacqulynn Cadet M.D.   On: 12/10/2017 13:07   Dg Chest Port 1 View  Result Date: 12/04/2017 CLINICAL DATA:  Pacemaker. EXAM: PORTABLE CHEST 1 VIEW COMPARISON:  Chest radiograph March 22, 2013 FINDINGS: Stable appearance of LEFT cardiac  pacemaker with lead tips projecting RIGHT atrium and RIGHT ventricle. Mild calcific atherosclerosis aortic arch. Mild cardiomegaly. Pulmonary vascular congestion without pleural effusion or focal consolidation. No pneumothorax. Soft tissue planes and included osseous structures are non suspicious. IMPRESSION: 1. No parent change in cardiac defibrillator. 2. Cardiomegaly and pulmonary vascular congestion. Electronically Signed   By: Elon Alas M.D.   On: 12/04/2017 16:44   Disposition   Patient was seen and examined by Dr. Sallyanne Kuster who deemed patient as stable for discharge. Follow-up has been arranged. Discharge medications as listed below.   Follow-up Plans & Appointments    Follow-up Information    Evans Lance, MD Follow up on 01/01/2018.   Specialty:  Cardiology Why:  3:30PM to revisit device options  Contact information: [address] [address] [phone]        Almyra Deforest, [place] Follow up on 12/24/2017.   Specialties:  Cardiology, Radiology Why:  Please arrive 15 minutes early for your 11:00am post-hospital cardiology appointment Contact information: [address] [place] [address] [phone]          Discharge Instructions    AMB Referral to [facility]   Complete by:  As directed    Please assign patient to social worker for post hospital follow up for transportation concerns with [facility]. Ricky Barnett, wife, for contact Also, please assess for [employer] program for this member.   Please assign to community nurse for post hospital calls and place in complex disease management program for HF [possible surgery in the future] and assess for home visits. Please speak with wife, Ricky Barnett (patient is somewhat confused today)  [phone].  Questions please call:   Natividad Brood, RN BSN [facility] Liaison  [phone] business mobile phone Toll free office [phone]   Reason for consult:   Transportation and Medication needs   Diagnoses of:  Heart Failure   Expected date of contact:  1-3 days (reserved for hospital discharges)      Discharge Medications   Allergies as of 12/10/2017   No Known Allergies     Medication List    STOP taking these medications   sacubitril-valsartan 49-51 MG Commonly known as:  ENTRESTO Replaced by:  sacubitril-valsartan 97-103 MG     TAKE these medications   acetaminophen 325 MG tablet Commonly known as:  TYLENOL Take 1-2 tablets (325-650 mg total) by mouth every 4 (four) hours as needed. What changed:  reasons to take this   apixaban 5 MG Tabs tablet Commonly known as:  ELIQUIS Take 1 tablet (5 mg total) by mouth 2 (two) times daily.   furosemide 80 MG tablet Commonly known as:  LASIX Take 1 tablet (80 mg total) by mouth daily. Start taking on:  12/11/2017 What changed:    medication strength  how much to take   metFORMIN 750 MG 24 hr tablet Commonly known as:  GLUCOPHAGE-XR Take 750 mg by mouth 2 (two) times daily.   metoprolol succinate 50 MG 24 hr tablet Commonly known as:  TOPROL-XL Take 50 mg by mouth daily. Take with or immediately following a meal.   multivitamin tablet  Take 1 tablet by mouth daily.   potassium chloride SA 20 MEQ tablet Commonly known as:  K-DUR,KLOR-CON Take 1 tablet (20 mEq total) by mouth daily.   pravastatin 40 MG tablet Commonly known as:  PRAVACHOL Take 1 tablet (40 mg total) by mouth daily.   sacubitril-valsartan 97-103 MG Commonly known as:  ENTRESTO Take 1 tablet by mouth 2 (two) times daily. Replaces:  sacubitril-valsartan 49-51 MG   tamsulosin 0.4 MG Caps capsule Commonly known as:  FLOMAX Take 0.4 mg by mouth daily after supper.         Outstanding Labs/Studies   Check BMET at next follow-up visit.   Duration of Discharge Encounter   Greater than 30 minutes including physician time.  Signed, Abigail Butts PA-C 12/10/2017, 2:02 PM

## 2017-12-10 NOTE — Plan of Care (Signed)
  Problem: Education: Goal: Knowledge of General Education information will improve Description Including pain rating scale, medication(s)/side effects and non-pharmacologic comfort measures Outcome: Completed/Met   Problem: Clinical Measurements: Goal: Ability to maintain clinical measurements within normal limits will improve Outcome: Completed/Met Goal: Will remain free from infection Outcome: Completed/Met Goal: Respiratory complications will improve Outcome: Completed/Met Goal: Cardiovascular complication will be avoided Outcome: Completed/Met   Problem: Activity: Goal: Risk for activity intolerance will decrease Outcome: Completed/Met   Problem: Nutrition: Goal: Adequate nutrition will be maintained Outcome: Completed/Met   Problem: Coping: Goal: Level of anxiety will decrease Outcome: Completed/Met   Problem: Elimination: Goal: Will not experience complications related to bowel motility Outcome: Completed/Met Goal: Will not experience complications related to urinary retention Outcome: Completed/Met   Problem: Pain Managment: Goal: General experience of comfort will improve Outcome: Completed/Met   Problem: Safety: Goal: Ability to remain free from injury will improve Outcome: Completed/Met   Problem: Skin Integrity: Goal: Risk for impaired skin integrity will decrease Outcome: Completed/Met   Problem: Education: Goal: Ability to demonstrate management of disease process will improve Outcome: Completed/Met Goal: Ability to verbalize understanding of medication therapies will improve Outcome: Completed/Met Goal: Individualized Educational Video(s) Outcome: Completed/Met   Problem: Activity: Goal: Capacity to carry out activities will improve Outcome: Completed/Met   Problem: Cardiac: Goal: Ability to achieve and maintain adequate cardiopulmonary perfusion will improve Outcome: Completed/Met   Problem: Education: Goal: Ability to demonstrate  management of disease process will improve Outcome: Completed/Met Goal: Ability to verbalize understanding of medication therapies will improve Outcome: Completed/Met Goal: Individualized Educational Video(s) Outcome: Completed/Met   Problem: Activity: Goal: Capacity to carry out activities will improve Outcome: Completed/Met   Problem: Cardiac: Goal: Ability to achieve and maintain adequate cardiopulmonary perfusion will improve Outcome: Completed/Met

## 2017-12-10 NOTE — Progress Notes (Signed)
   Ricky Barnett presented for a nuclear stress test today.  No immediate complications.  Stress imaging is pending at this time.  Preliminary EKG findings may be listed in the chart, but the stress test result will not be finalized until perfusion imaging is complete.   Kathyrn Drown, NP-C 12/10/2017, 10:19 AM

## 2017-12-10 NOTE — Progress Notes (Signed)
Discharge instructions, RX's and follow up appts explained and provided patient and c/g verbalized understanding. Patient left floor via wheelchair accompanied by staff.  Lavanna Rog, Tivis Ringer, RN

## 2017-12-11 ENCOUNTER — Other Ambulatory Visit: Payer: Self-pay

## 2017-12-11 NOTE — Telephone Encounter (Signed)
With Verbal permission, spoke to Pt's wife.  Patient contacted regarding discharge from [facility] on 01/10/18.  Patient understands to follow up with provider Almyra Deforest, [place] on 12/24/17 at 11 am at [facility] . Patient understands discharge instructions? yes Patient understands medications and regiment? yes Patient understands to bring all medications to this visit? yes

## 2017-12-11 NOTE — Patient Outreach (Signed)
[place] [facility]) Care Management  12/11/2017  Ricky Barnett 19-Dec-1940 [id number]  Successful call to the patient's wife, Ricky Barnett who is listed on the patients consent. Ricky Barnett is able to confirm patients HIPAA identifiers. BSW introduced self to Ricky Barnett and the reason for today's call, indicating the patient had been referred to [facility] for assistance with transportation. Ricky Barnett states she and the patient do not have a car. Ricky Barnett also states the patient has Medicare coverage under [facility] and [facility].  The patient is aware of [employer] transportation benefit and has utilized this service in the past. Ricky Barnett reports recently the arranged transportation has not shown up causing the patient to miss scheduled appointments. Ricky Barnett denies reporting these missed rides to the patients health plan. BSW educated Ricky Barnett on [employer] and encouraged her to always call with concerns. Ricky Barnett stated understanding.  BSW spoke with Ricky Barnett about applying for SCAT. Ricky Barnett is interested in this but states she must end today's call as her ride has arrived to take her to the pharmacy. Ricky Barnett requests this BSW contact her at a later time.   Prior to ending today's call, BSW was able to discuss the [facility] Well Dine benefit. Ricky Barnett is in agreement with BSW referring the patient to this program. Ricky Barnett states her husband is diabetic and on a low sodium diet. Ricky Barnett denies food allergies or any special diet concerns. BSW placed a call to [facility] and referred the patient to the Well Dine program. The patient is scheduled to receive low-sodium, diabetic meals on 10/16.  Plan: BSW to continue to follow to assist with a SCAT application.  Daneen Schick, BSW, CDP [facility] [phone]

## 2017-12-11 NOTE — Patient Outreach (Signed)
[place] [facility]) Care Management  12/11/2017  Ricky Barnett Jan 31, 1941 [id number]   77 year old with history of heart failure, HTN, DM, paroxysmal atrial fibrillation, pacemaker. Client hospitalized 10/8-10/10 with acute on chronic heart failure. Primary care listed as completing transition of care.  RNCM called to follow up. Spoke with client's wife as client was asleep. Ricky Barnett on consent. She reports client is doing well, but tires easily. She denies any edema. She denies any signs/symptoms of heart failure exacerbation.  She reports obtaining a scale and states will begin weighing client today.  Medications reviewed. Ricky Barnett denies any questions or concerns.  Plan: update assigned care coordinator, [place], [place]. THN CM Care Plan Problem One     Most Recent Value  Care Plan Problem One  at risk for readmission as evidence by recent admission.  Role Documenting the Problem One  Care Management [place] for Problem One  Active  [facility] Long Term Goal   client will not be readmitted within the next 31 days.  [facility]N Long Term Goal Start Date  12/11/17  Interventions for Problem One Long Term Goal  medications reviewed, reviewed upcoming appointments, encouraged Mrs. Radel to call RNCM as needed,   [facility]N CM Short Term Goal #1   client will weigh daily within the next 30 days.  [facility] CM Short Term Goal #1 Start Date  12/11/17  Interventions for Short Term Goal #1  importance of daily weights discussed.  THN CM Short Term Goal #2   client/caregiver will record weights within the next 30 days.   Interventions for Short Term Goal #2  RNCM instructed on best practice to weigh-encouraged to weight client daily before meals, after going to the bathroom and before meals/fluids.     Covering RNCM: Thea Silversmith, RN, MSN, [place] Coordinator Cell: [phone]

## 2017-12-15 ENCOUNTER — Encounter (HOSPITAL_COMMUNITY): Payer: Self-pay | Admitting: Internal Medicine

## 2017-12-15 ENCOUNTER — Other Ambulatory Visit: Payer: Self-pay

## 2017-12-15 ENCOUNTER — Other Ambulatory Visit: Payer: Self-pay | Admitting: *Deleted

## 2017-12-15 NOTE — Patient Outreach (Signed)
[place] [facility]) Care Management  12/15/2017  RODMAN RECUPERO 1940/11/03 [id number]   Call placed to member/wife to introduce self as assigned care manager and schedule home visit.  Wife report member is progressing well, denies any urgent concerns.  Home visit scheduled for next week.  [name], [place], MSN [place] [phone]

## 2017-12-15 NOTE — Patient Outreach (Signed)
[place] [facility]) Care Management  12/15/2017  Ricky Barnett [birth year] [id number]  Successful outreach to the patients wife, Ricky Barnett on today's date. HIPAA identifiers confirmed. BSW informed [name] to expect the patients [employer] Well Dine order to arrive within the next 1-2 business days. [name] stated understanding. BSW revisited transportation needs as the previous call was cut short due to [name] running an errand. BSW spoke with [name] about SCAT.   [name] states that she and the patient have used the city bus route in the past but it is difficult to walk to and from bus routes due to the patients "bad knees and bad heart". BSW completed a SCAT application with [name] during today's call. BSW explained the SCAT interview process to [name] and is mailing the information for her review.   Plan: BSW to contact the patient and/or [name] in the next three weeks to confirm receipt of mailing. BSW will perform a case closure once it is confirmed the patient has been linked with SCAT.  Daneen Schick, BSW, CDP [facility] [phone]

## 2017-12-16 ENCOUNTER — Ambulatory Visit: Payer: Medicare HMO

## 2017-12-18 ENCOUNTER — Ambulatory Visit: Payer: Medicare HMO

## 2017-12-23 ENCOUNTER — Encounter: Payer: Self-pay | Admitting: *Deleted

## 2017-12-23 ENCOUNTER — Other Ambulatory Visit: Payer: Self-pay | Admitting: *Deleted

## 2017-12-23 NOTE — Patient Outreach (Signed)
[place] [facility]) Care Management   12/23/2017  AZIZ SLAPE 10-18-1940 [id number]  MONTOYA BRANDEL is an 77 y.o. male  Subjective:   Member alert and oriented x3, denies any pain or discomfort at this time, does report some shortness of breath at times with activity.  Medications managed by wife using pill boxes, report compliance.  Had started daily weights but stopped when scale stopped working.  Has follow up with cardiology tomorrow, follow up with primary MD within the next 2 weeks.  Objective:   Review of Systems  Constitutional: Negative.   HENT: Negative.   Eyes: Negative.   Respiratory: Positive for shortness of breath.        Intermittent  Cardiovascular: Negative.   Gastrointestinal: Negative.   Genitourinary: Negative.   Musculoskeletal: Negative.   Skin: Negative.   Neurological: Negative.   Endo/Heme/Allergies: Negative.   Psychiatric/Behavioral: Negative.     Physical Exam  Constitutional: He is oriented to person, place, and time. He appears well-developed and well-nourished.  Neck: Normal range of motion.  Cardiovascular: Normal rate and regular rhythm.  Respiratory: Effort normal and breath sounds normal.  GI: Soft. Bowel sounds are normal.  Musculoskeletal: Normal range of motion.  Neurological: He is alert and oriented to person, place, and time.  Skin: Skin is warm and dry.   BP 118/64 (BP Location: Right Arm, Patient Position: Sitting, Cuff Size: Normal)   Pulse 93   Resp 18   Ht 1.803 m ('5\' 11"'$ )   Wt 219 lb (99.3 kg)   SpO2 98%   BMI 30.54 kg/m   Encounter Medications:   Outpatient Encounter Medications as of 12/23/2017  Medication Sig  . acetaminophen (TYLENOL) 325 MG tablet Take 1-2 tablets (325-650 mg total) by mouth every 4 (four) hours as needed. (Patient taking differently: Take 325-650 mg by mouth every 4 (four) hours as needed (for pain.). )  . apixaban (ELIQUIS) 5 MG TABS tablet Take 1 tablet (5 mg total) by mouth 2  (two) times daily.  . furosemide (LASIX) 80 MG tablet Take 1 tablet (80 mg total) by mouth daily.  . metFORMIN (GLUCOPHAGE-XR) 750 MG 24 hr tablet Take 750 mg by mouth 2 (two) times daily.   . metoprolol succinate (TOPROL-XL) 50 MG 24 hr tablet Take 50 mg by mouth daily. Take with or immediately following a meal.   . potassium chloride SA (K-DUR,KLOR-CON) 20 MEQ tablet Take 1 tablet (20 mEq total) by mouth daily.  . pravastatin (PRAVACHOL) 40 MG tablet Take 1 tablet (40 mg total) by mouth daily.  . sacubitril-valsartan (ENTRESTO) 97-103 MG Take 1 tablet by mouth 2 (two) times daily.  . Tamsulosin HCl (FLOMAX) 0.4 MG CAPS Take 0.4 mg by mouth daily after supper.  . Multiple Vitamin (MULTIVITAMIN) tablet Take 1 tablet by mouth daily.   No facility-administered encounter medications on file as of 12/23/2017.     Functional Status:   In your present state of health, do you have any difficulty performing the following activities: 12/23/2017 12/08/2017  Hearing? N N  Vision? N N  Difficulty concentrating or making decisions? N N  Walking or climbing stairs? N Y  Dressing or bathing? N N  Doing errands, shopping? Y Y  Comment No transportation -  Conservation officer, nature and eating ? N -  Using the Toilet? N -  In the past six months, have you accidently leaked urine? N -  Do you have problems with loss of bowel control? N -  Managing  your Medications? Y -  Managing your Finances? N -  Housekeeping or managing your Housekeeping? N -  Some recent data might be hidden    Fall/Depression Screening:    Fall Risk  12/23/2017 12/27/2014  Falls in the past year? No No   PHQ 2/9 Scores 12/23/2017  PHQ - 2 Score 0    Assessment:    Met with member at scheduled time.  Wife report member is in need of a new pacemaker however the procedure will not be a simple revision.  State procedure will be more invasive then the initial implant.  They will find out what the plan is within the next week.  Denies  any urgent concerns, provided with this care manager's contact information, advised to contact with questions.  Plan:   Will follow up with member within the next month.  [facility] CM Care Plan Problem One     Most Recent Value  Care Plan Problem One  at risk for readmission as evidence by recent admission.  Role Documenting the Problem One  Care Management [place] for Problem One  Active  [facility] Long Term Goal   client will not be readmitted within the next 31 days.  THN Long Term Goal Start Date  12/11/17  Interventions for Problem One Long Term Goal  Educated member and wife on heart failure zones.  Reviewed booklet Living Better with Heart Failure.   THN CM Short Term Goal #1   client will weigh daily within the next 30 days.  [facility]N CM Short Term Goal #1 Start Date  12/11/17  Interventions for Short Term Goal #1  Discussed with member and wife the importance of getting new scale to continue daily weights in effort to monitor fluid status  THN CM Short Term Goal #2   client/caregiver will record weights within the next 30 days.   Interventions for Short Term Goal #2  Provided member and wife with copies of logs for recording daily weights.     [name], [place], MSN [place] [phone]

## 2017-12-24 ENCOUNTER — Ambulatory Visit (INDEPENDENT_AMBULATORY_CARE_PROVIDER_SITE_OTHER): Payer: Medicare HMO | Admitting: Physician Assistant

## 2017-12-24 ENCOUNTER — Encounter: Payer: Self-pay | Admitting: Physician Assistant

## 2017-12-24 ENCOUNTER — Other Ambulatory Visit: Payer: Self-pay

## 2017-12-24 VITALS — BP 118/78 | HR 110 | Ht 71.0 in | Wt 214.0 lb

## 2017-12-24 DIAGNOSIS — I480 Paroxysmal atrial fibrillation: Secondary | ICD-10-CM

## 2017-12-24 DIAGNOSIS — I5022 Chronic systolic (congestive) heart failure: Secondary | ICD-10-CM | POA: Diagnosis not present

## 2017-12-24 DIAGNOSIS — Z950 Presence of cardiac pacemaker: Secondary | ICD-10-CM | POA: Diagnosis not present

## 2017-12-24 DIAGNOSIS — I10 Essential (primary) hypertension: Secondary | ICD-10-CM

## 2017-12-24 LAB — BASIC METABOLIC PANEL
BUN/Creatinine Ratio: 10 (ref 10–24)
BUN: 16 mg/dL (ref 8–27)
CO2: 23 mmol/L (ref 20–29)
Calcium: 8.7 mg/dL (ref 8.6–10.2)
Chloride: 100 mmol/L (ref 96–106)
Creatinine, Ser: 1.63 mg/dL — ABNORMAL HIGH (ref 0.76–1.27)
GFR calc Af Amer: 46 mL/min/{1.73_m2} — ABNORMAL LOW (ref >59)
GFR, EST NON AFRICAN AMERICAN: 40 mL/min/{1.73_m2} — AB (ref >59)
GLUCOSE: 144 mg/dL — AB (ref 65–99)
Potassium: 4.2 mmol/L (ref 3.5–5.2)
Sodium: 139 mmol/L (ref 134–144)

## 2017-12-24 NOTE — Patient Instructions (Signed)
Medication Instructions:  No Changes If you need a refill on your cardiac medications before your next appointment, please call your pharmacy.   Lab work: BMET If you have labs (blood work) drawn today and your tests are completely normal, you will receive your results only by: Marland Kitchen MyChart Message (if you have MyChart) OR . A paper copy in the mail If you have any lab test that is abnormal or we need to change your treatment, we will call you to review the results.  Testing/Procedures: None Ordered.  Follow-Up: At [facility], you and your health needs are our priority.  As part of our continuing mission to provide you with exceptional heart care, we have created designated Provider Care Teams.  These Care Teams include your primary Cardiologist (physician) and Advanced Practice Providers (APPs -  Physician Assistants and Nurse Practitioners) who all work together to provide you with the care you need, when you need it. Marland Kitchen Keep follow up appointment with Dr.Taylor on 01/01/18 . Keep follow up appointment with Dr. Sallyanne Kuster on 02/03/18  Any Other Special Instructions Will Be Listed Below (If Applicable). None

## 2017-12-24 NOTE — Progress Notes (Signed)
Cardiology Office Note    Date:  12/24/2017   ID:  Ricky Barnett, DOB 02/24/1941, MRN [id number]  PCP:  Merrilee Seashore, MD  Cardiologist:  Dr. Sallyanne Kuster Primary electrophysiologist: Dr. Lovena Le  Chief Complaint  Patient presents with  . Follow-up    denies shortness of breath discomfort    History of Present Illness:  Ricky Barnett is a 77 y.o. male with past medical history of hypertension, s/p [employer] dual-chamber pacemaker, chronic systolic heart failure, paroxysmal atrial fibrillation and atrial flutter.  Patient had a worsening LV dysfunction with EF decreased from the previous 45- 50% in 2014 to 20% by echocardiogram in September 2019.  There was also significant pacing induced dyssynchrony on his echocardiogram and a markedly elevated filling pressure as well as moderate to severe MR.  Previous work-up for ischemic heart disease in 2014 but Myoview was negative.  He was scheduled to undergo coronary CT, but this was not done.  He underwent attempted upgraded to biventricular pacemaker by Dr. Lovena Le in October 2019, this was unsuccessful due to total occlusion of the subclavian vein demonstrated by venography.  He is dry weight was previously around 220 pounds.  He was readmitted to the hospital on 12/08/2017 with worsening shortness of breath and a new weight of 245 pounds.  He underwent aggressive IV diuresis with improvement, his discharge weight was 220 pounds again.  During this admission, Delene Loll was maximized.  Myoview obtained on 12/10/2017 showed a large region of remote prior infarct or scarring in the inferior wall extending into the inferoseptal and inferolateral wall from the base to apex, no evidence of reversible ischemia, EF 21%. He will still need to be seen by Dr. Lovena Le for consideration of pacing alternative.  Patient presents today for cardiology office visit.  He denies any chest pain or shortness of breath.  He has no lower extremity edema, orthopnea or  PND.  He lost another 5 pounds since discharge, I am concerned he is the being dehydrated.  I will obtain a basic metabolic panel for reassessment.  He has a follow-up with Dr. Lovena Le next week regarding pacemaker procedure.  Once the pacemaker issue is fixed, I would recommend a repeat echocardiogram in 3 months.  I am unable to uptitrate heart failure medication any further.  Note, today's EKG does show he is in trigeminy, however he is asymptomatic from this.  I did not add on more beta-blocker given prolonged first-degree heart block.   Past Medical History:  Diagnosis Date  . Angina    just started recently ,2-3 days ago-went to see Dr. Einar Gip 05/14/2011  . Arthritis    bil knees  . Diabetes mellitus    was at one time lost weight amd no problem with it now. About 2-3 yrs ago  . Dysrhythmia    2-3 yrs ago  . Headache(784.0)    occas  . Hypertension   . Mastoiditis    right ear  . Pacemaker 11/29/2012   dual chamber  . Palpitations     Past Surgical History:  Procedure Laterality Date  . BIV UPGRADE N/A 12/04/2017   Procedure: Dual Chamber PPM upgrading to BIV PPM;  Surgeon: Evans Lance, MD;  Location: [place] [facility];  Service: Cardiovascular;  Laterality: N/A;  . CYSTECTOMY     under chin 1983  . FINGER SURGERY     states he cut finger off when cutting wood  . 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 NASAL SINUS SURGERY     left side  . PERMANENT PACEMAKER INSERTION N/A 11/29/2012   Procedure: PERMANENT PACEMAKER INSERTION;  Surgeon: Sanda Klein, MD;  Location: [place] [facility];  Service: Cardiovascular;  Laterality: N/A;  . TYMPANOPLASTY  05/19/2011   Procedure: TYMPANOPLASTY;  Surgeon: Thornell Sartorius, MD;  Location: [facility];  Service: ENT;  Laterality: Right;  Exploratory Tympanotomy    Current Medications: Outpatient Medications Prior to Visit  Medication Sig Dispense Refill  . acetaminophen (TYLENOL) 325 MG tablet Take 1-2 tablets  (325-650 mg total) by mouth every 4 (four) hours as needed. (Patient taking differently: Take 325-650 mg by mouth every 4 (four) hours as needed (for pain.). )    . apixaban (ELIQUIS) 5 MG TABS tablet Take 1 tablet (5 mg total) by mouth 2 (two) times daily. 60 tablet 0  . furosemide (LASIX) 80 MG tablet Take 1 tablet (80 mg total) by mouth daily. 30 tablet 3  . metFORMIN (GLUCOPHAGE-XR) 750 MG 24 hr tablet Take 750 mg by mouth 2 (two) times daily.     . metoprolol succinate (TOPROL-XL) 50 MG 24 hr tablet Take 50 mg by mouth daily. Take with or immediately following a meal.     . Multiple Vitamin (MULTIVITAMIN) tablet Take 1 tablet by mouth daily.    . potassium chloride SA (K-DUR,KLOR-CON) 20 MEQ tablet Take 1 tablet (20 mEq total) by mouth daily. 90 tablet 3  . pravastatin (PRAVACHOL) 40 MG tablet Take 1 tablet (40 mg total) by mouth daily. 90 tablet 2  . sacubitril-valsartan (ENTRESTO) 97-103 MG Take 1 tablet by mouth 2 (two) times daily. 60 tablet 3  . Tamsulosin HCl (FLOMAX) 0.4 MG CAPS Take 0.4 mg by mouth daily after supper.     No facility-administered medications prior to visit.      Allergies:   Patient has no known allergies.   Social History   Socioeconomic History  . Marital status: Married    Spouse name: Not on file  . Number of children: Not on file  . Years of education: Not on file  . Highest education level: Not on file  Occupational History  . Not on file  Social Needs  . Financial resource strain: Not on file  . Food insecurity:    Worry: Not on file    Inability: Not on file  . Transportation needs:    Medical: Not on file    Non-medical: Not on file  Tobacco Use  . Smoking status: Never Smoker  . Smokeless tobacco: Never Used  Substance and Sexual Activity  . Alcohol use: No  . Drug use: No  . Sexual activity: Not on file  Lifestyle  . Physical activity:    Days per week: Not on file    Minutes per session: Not on file  . Stress: Not on file    Relationships  . Social connections:    Talks on phone: Not on file    Gets together: Not on file    Attends religious service: Not on file    Active member of club or organization: Not on file    Attends meetings of clubs or organizations: Not on file    Relationship status: Not on file  Other Topics Concern  . Not on file  Social History Narrative  . Not on file     Family History:  The patient's family history includes Heart attack in his father.   ROS:  Please see the history of present illness.    ROS All other systems reviewed and are negative.   PHYSICAL EXAM:   VS:  BP 118/78 (BP Location: Left Arm, Patient Position: Sitting, Cuff Size: Large)   Pulse (!) 110   Ht 5\' 11"  (1.803 m)   Wt 214 lb (97.1 kg)   SpO2 97%   BMI 29.85 kg/m    GEN: Well nourished, well developed, in no acute distress  HEENT: normal  Neck: no JVD, carotid bruits, or masses Cardiac: RRR; no murmurs, rubs, or gallops,no edema  Respiratory:  clear to auscultation bilaterally, normal work of breathing GI: soft, nontender, nondistended, + BS MS: no deformity or atrophy  Skin: warm and dry, no rash Neuro:  Alert and Oriented x 3, Strength and sensation are intact Psych: euthymic mood, full affect  Wt Readings from Last 3 Encounters:  12/24/17 214 lb (97.1 kg)  12/23/17 219 lb (99.3 kg)  12/10/17 220 lb 3.2 oz (99.9 kg)      Studies/Labs Reviewed:   EKG:  EKG is ordered today.  The ekg ordered today demonstrates sinus rhythm, first-degree AV block and trigeminy.  Recent Labs: 12/08/2017: B Natriuretic Peptide 3,398.8; Hemoglobin 13.9; Platelets 364 12/10/2017: BUN 22; Creatinine, Ser 1.55; Potassium 3.5; Sodium 142   Lipid Panel No results found for: CHOL, TRIG, HDL, CHOLHDL, VLDL, LDLCALC, LDLDIRECT  Additional studies/ records that were reviewed today include:   Echo 11/17/2017 LV EF: 20% Study Conclusions  - Left ventricle: The cavity size was mildly dilated. Wall    thickness was increased in a pattern of mild LVH. The estimated   ejection fraction was 20%. Diffuse hypokinesis. Septal-lateral   dyssynchrony. Features are consistent with a pseudonormal left   ventricular filling pattern, with concomitant abnormal relaxation   and increased filling pressure (grade 2 diastolic dysfunction).   E/medial e&' > 15, suggesting LV end diastolic pressure at least   20 mmHg. - Aortic valve: There was no stenosis. There was mild to moderate   regurgitation. - Mitral valve: There was moderate to severe regurgitation. Central   MR, suspect functional. - Left atrium: The atrium was moderately dilated. - Right ventricle: Pacer wire or catheter noted in right ventricle.   Systolic function was mildly to moderately reduced. - Right atrium: The atrium was mildly dilated. - Tricuspid valve: Peak RV-RA gradient (S): 69 mm Hg. - Pulmonary arteries: PA peak pressure: 84 mm Hg (S). - Systemic veins: IVC measured 2.7 cm with < 50% respirophasic   variation, suggesting RA pressure 15 mmHg.  Impressions:  - Mildly dilated LV with mild LV hypertrophy. EF 20%, diffuse   hypokinesis with septal-lateral dyssynchrony. Moderate diastolic   dysfunction with evidence for elevated LV filling pressure.   Normal RV size with mild-moderately decreased systolic function.   Dilated IVC suggesting elevated RV filling pressure.   Moderate-severe MR, suspect functional. Severe pulmonary   hypertension.   Myoview 12/10/2017 IMPRESSION: 1. Large region of remote prior infarct or scarring in the inferior wall extending into the inferoseptal and inferolateral walls from the base to the apex. No evidence of reversible ischemia.  2. Marked left ventricular dilatation and severe global hypokinesis.  3. Left ventricular ejection fraction 21%  4. Non invasive risk stratification*: High   ASSESSMENT:    1. Chronic systolic heart failure ([place])   2. Essential hypertension   3.  Pacemaker   4. PAF (paroxysmal atrial fibrillation) ([facility])      PLAN:  In order of  problems listed above:  1. Chronic systolic heart failure: He has worsening ejection fraction.  Last echocardiogram in September 2019 showed EF 20%.  Myoview showed large region of scar however no reversible ischemia.  There is some suspicion that pacemaker induced dyssynergy may be contributing to the worsening LV function.  He underwent diuresis during the recent hospitalization.  He appears to be euvolemic on physical exam.  However, his weight continue to drop since discharge.  I will obtain a basic metabolic panel to rule out dehydration.  Delene Loll has been maximized.  Continue Toprol-XL.  2. Pacemaker: BiV PPM upgrade was unsuccessful on 12/04/2017 due to chronic occlusion of left subclavian vein.  He will need follow-up with Dr. Lovena Le to discuss alternative therapy.  3. Hypertension: Blood pressure stable  4. PAF: On Eliquis 5 mg twice daily.  Rate controlled on metoprolol.    Medication Adjustments/Labs and Tests Ordered: Current medicines are reviewed at length with the patient today.  Concerns regarding medicines are outlined above.  Medication changes, Labs and Tests ordered today are listed in the Patient Instructions below. Patient Instructions  Medication Instructions:  No Changes If you need a refill on your cardiac medications before your next appointment, please call your pharmacy.   Lab work: BMET If you have labs (blood work) drawn today and your tests are completely normal, you will receive your results only by: Marland Kitchen MyChart Message (if you have MyChart) OR . A paper copy in the mail If you have any lab test that is abnormal or we need to change your treatment, we will call you to review the results.  Testing/Procedures: None Ordered.  Follow-Up: At [facility], you and your health needs are our priority.  As part of our continuing mission to provide you with exceptional heart  care, we have created designated Provider Care Teams.  These Care Teams include your primary Cardiologist (physician) and Advanced Practice Providers (APPs -  Physician Assistants and Nurse Practitioners) who all work together to provide you with the care you need, when you need it. Marland Kitchen Keep follow up appointment with Dr.Taylor on 01/01/18 . Keep follow up appointment with Dr. Sallyanne Kuster on 02/03/18  Any Other Special Instructions Will Be Listed Below (If Applicable). None      Hilbert Corrigan, [place]  12/24/2017 1:16 PM    [facility] [address], [place], [place]  [address] Phone: [phone]; Fax: [phone]

## 2017-12-25 LAB — CUP PACEART INCLINIC DEVICE CHECK
Date Time Interrogation Session: 20191025154412
Implantable Lead Implant Date: 20140929
Implantable Lead Implant Date: 20140929
Implantable Lead Location: 753859
Implantable Lead Serial Number: 29473237
Implantable Pulse Generator Implant Date: 20140929
MDC IDC LEAD LOCATION: 753860
MDC IDC LEAD SERIAL: 29365186
Pulse Gen Serial Number: 112906

## 2017-12-29 NOTE — Progress Notes (Signed)
Thank you Mcr

## 2017-12-30 ENCOUNTER — Ambulatory Visit (HOSPITAL_COMMUNITY): Admission: RE | Admit: 2017-12-30 | Payer: Medicare HMO | Source: Ambulatory Visit

## 2017-12-30 ENCOUNTER — Ambulatory Visit (HOSPITAL_COMMUNITY): Payer: Medicare HMO

## 2018-01-01 ENCOUNTER — Ambulatory Visit (INDEPENDENT_AMBULATORY_CARE_PROVIDER_SITE_OTHER): Payer: Medicare HMO | Admitting: Internal Medicine

## 2018-01-01 ENCOUNTER — Encounter: Payer: Self-pay | Admitting: Internal Medicine

## 2018-01-01 VITALS — BP 124/76 | HR 89 | Ht 71.0 in | Wt 216.0 lb

## 2018-01-01 DIAGNOSIS — I480 Paroxysmal atrial fibrillation: Secondary | ICD-10-CM

## 2018-01-01 DIAGNOSIS — I441 Atrioventricular block, second degree: Secondary | ICD-10-CM | POA: Diagnosis not present

## 2018-01-01 DIAGNOSIS — Z950 Presence of cardiac pacemaker: Secondary | ICD-10-CM

## 2018-01-01 LAB — CUP PACEART INCLINIC DEVICE CHECK
Date Time Interrogation Session: 20191101040000
Implantable Lead Implant Date: 20140929
Implantable Lead Location: 753859
Implantable Lead Location: 753860
Implantable Lead Serial Number: 29473237
Implantable Pulse Generator Implant Date: 20140929
Lead Channel Impedance Value: 643 Ohm
Lead Channel Pacing Threshold Amplitude: 0.7 V
Lead Channel Pacing Threshold Pulse Width: 0.5 ms
Lead Channel Sensing Intrinsic Amplitude: 24.7 mV
Lead Channel Setting Pacing Amplitude: 2 V
Lead Channel Setting Pacing Pulse Width: 0.5 ms
Lead Channel Setting Sensing Sensitivity: 2.5 mV
MDC IDC LEAD IMPLANT DT: 20140929
MDC IDC LEAD SERIAL: 29365186
MDC IDC MSMT LEADCHNL RA IMPEDANCE VALUE: 621 Ohm
MDC IDC MSMT LEADCHNL RA PACING THRESHOLD AMPLITUDE: 0.7 V
MDC IDC MSMT LEADCHNL RA PACING THRESHOLD PULSEWIDTH: 0.5 ms
MDC IDC MSMT LEADCHNL RA SENSING INTR AMPL: 3.6 mV
MDC IDC SET LEADCHNL RV PACING AMPLITUDE: 2.5 V
MDC IDC STAT BRADY RA PERCENT PACED: 3 %
MDC IDC STAT BRADY RV PERCENT PACED: 85 %
Pulse Gen Serial Number: 112906

## 2018-01-01 NOTE — H&P (View-Only) (Signed)
HPI [name] returns today for followup. He is a pleasant 77 yo man with CHB, s/p PPM who has developed PM induced LV dysfunction. He then under went attempted insertion of a new LV lead but was found to have an occluded left subclavian vein. He has a PR interval of 500 ms. He has felt weak and his energy level is low. No Known Allergies   Current Outpatient Medications  Medication Sig Dispense Refill  . acetaminophen (TYLENOL) 325 MG tablet Take 1-2 tablets (325-650 mg total) by mouth every 4 (four) hours as needed. (Patient taking differently: Take 325-650 mg by mouth every 4 (four) hours as needed (for pain.). )    . apixaban (ELIQUIS) 5 MG TABS tablet Take 1 tablet (5 mg total) by mouth 2 (two) times daily. 60 tablet 0  . furosemide (LASIX) 80 MG tablet Take 1 tablet (80 mg total) by mouth daily. 30 tablet 3  . metFORMIN (GLUCOPHAGE-XR) 750 MG 24 hr tablet Take 750 mg by mouth 2 (two) times daily.     . metoprolol succinate (TOPROL-XL) 50 MG 24 hr tablet Take 50 mg by mouth daily. Take with or immediately following a meal.     . Multiple Vitamin (MULTIVITAMIN) tablet Take 1 tablet by mouth daily.    . potassium chloride SA (K-DUR,KLOR-CON) 20 MEQ tablet Take 1 tablet (20 mEq total) by mouth daily. 90 tablet 3  . pravastatin (PRAVACHOL) 40 MG tablet Take 1 tablet (40 mg total) by mouth daily. 90 tablet 2  . sacubitril-valsartan (ENTRESTO) 97-103 MG Take 1 tablet by mouth 2 (two) times daily. 60 tablet 3  . Tamsulosin HCl (FLOMAX) 0.4 MG CAPS Take 0.4 mg by mouth daily after supper.     No current facility-administered medications for this visit.      Past Medical History:  Diagnosis Date  . Angina    just started recently ,2-3 days ago-went to see Dr. Einar Gip 05/14/2011  . Arthritis    bil knees  . Diabetes mellitus    was at one time lost weight amd no problem with it now. About 2-3 yrs ago  . Dysrhythmia    2-3 yrs ago  . Headache(784.0)    occas  . Hypertension   .  Mastoiditis    right ear  . Pacemaker 11/29/2012   dual chamber  . Palpitations     ROS:   All systems reviewed and negative except as noted in the HPI.   Past Surgical History:  Procedure Laterality Date  . BIV UPGRADE N/A 12/04/2017   Procedure: Dual Chamber PPM upgrading to BIV PPM;  Surgeon: Evans Lance, MD;  Location: [place] [facility];  Service: Cardiovascular;  Laterality: N/A;  . CYSTECTOMY     under chin 1983  . FINGER SURGERY     states he cut finger off when cutting wood  . INSERT / REPLACE / REMOVE PACEMAKER  11/29/2012   [employer]   . NASAL SINUS SURGERY     left side  . PERMANENT PACEMAKER INSERTION N/A 11/29/2012   Procedure: PERMANENT PACEMAKER INSERTION;  Surgeon: Sanda Klein, MD;  Location: [place] [facility];  Service: Cardiovascular;  Laterality: N/A;  . TYMPANOPLASTY  05/19/2011   Procedure: TYMPANOPLASTY;  Surgeon: Thornell Sartorius, MD;  Location: [facility] OR;  Service: ENT;  Laterality: Right;  Exploratory Tympanotomy     Family History  Problem Relation Age of Onset  . Heart attack Father  Social History   Socioeconomic History  . Marital status: Married    Spouse name: Not on file  . Number of children: Not on file  . Years of education: Not on file  . Highest education level: Not on file  Occupational History  . Not on file  Social Needs  . Financial resource strain: Not on file  . Food insecurity:    Worry: Not on file    Inability: Not on file  . Transportation needs:    Medical: Not on file    Non-medical: Not on file  Tobacco Use  . Smoking status: Never Smoker  . Smokeless tobacco: Never Used  Substance and Sexual Activity  . Alcohol use: No  . Drug use: No  . Sexual activity: Not on file  Lifestyle  . Physical activity:    Days per week: Not on file    Minutes per session: Not on file  . Stress: Not on file  Relationships  . Social connections:    Talks on phone: Not on file    Gets together: Not on file     Attends religious service: Not on file    Active member of club or organization: Not on file    Attends meetings of clubs or organizations: Not on file    Relationship status: Not on file  . Intimate partner violence:    Fear of current or ex partner: Not on file    Emotionally abused: Not on file    Physically abused: Not on file    Forced sexual activity: Not on file  Other Topics Concern  . Not on file  Social History Narrative  . Not on file     BP 124/76   Pulse 89   Ht 5\' 11"  (1.803 m)   Wt 216 lb (98 kg)   SpO2 94%   BMI 30.13 kg/m   Physical Exam:  Well appearing NAD HEENT: Unremarkable Neck:  No JVD, no thyromegally Lymphatics:  No adenopathy Back:  No CVA tenderness Lungs:  Clear with no wheezes. HEART:  Regular rate rhythm, no murmurs, no rubs, no clicks Abd:  soft, positive bowel sounds, no organomegally, no rebound, no guarding Ext:  2 plus pulses, no edema, no cyanosis, no clubbing Skin:  No rashes no nodules Neuro:  CN II through XII intact, motor grossly intact  EKG - none  DEVICE  Normal device function.  See PaceArt for details. AV delay at 160/180 as he was pacing at 340.  Assess/Plan: 1. Pacing induced LBBB/LV dysfunction - we discussed the options from extraction/insertion of a new lead to epicardial lead placement. I have reviewed the risks/benefits/goals/expectations of both surgical options and he wishes to proceed. I spent 25 minutes with over 50% face to face time discussing the options with the patient.  Mikle Bosworth.D.

## 2018-01-01 NOTE — Progress Notes (Signed)
HPI Ricky Barnett returns today for followup. He is a pleasant 77 yo man with CHB, s/p PPM who has developed PM induced LV dysfunction. He then under went attempted insertion of a new LV lead but was found to have an occluded left subclavian vein. He has a PR interval of 500 ms. He has felt weak and his energy level is low. No Known Allergies   Current Outpatient Medications  Medication Sig Dispense Refill  . acetaminophen (TYLENOL) 325 MG tablet Take 1-2 tablets (325-650 mg total) by mouth every 4 (four) hours as needed. (Patient taking differently: Take 325-650 mg by mouth every 4 (four) hours as needed (for pain.). )    . apixaban (ELIQUIS) 5 MG TABS tablet Take 1 tablet (5 mg total) by mouth 2 (two) times daily. 60 tablet 0  . furosemide (LASIX) 80 MG tablet Take 1 tablet (80 mg total) by mouth daily. 30 tablet 3  . metFORMIN (GLUCOPHAGE-XR) 750 MG 24 hr tablet Take 750 mg by mouth 2 (two) times daily.     . metoprolol succinate (TOPROL-XL) 50 MG 24 hr tablet Take 50 mg by mouth daily. Take with or immediately following a meal.     . Multiple Vitamin (MULTIVITAMIN) tablet Take 1 tablet by mouth daily.    . potassium chloride SA (K-DUR,KLOR-CON) 20 MEQ tablet Take 1 tablet (20 mEq total) by mouth daily. 90 tablet 3  . pravastatin (PRAVACHOL) 40 MG tablet Take 1 tablet (40 mg total) by mouth daily. 90 tablet 2  . sacubitril-valsartan (ENTRESTO) 97-103 MG Take 1 tablet by mouth 2 (two) times daily. 60 tablet 3  . Tamsulosin HCl (FLOMAX) 0.4 MG CAPS Take 0.4 mg by mouth daily after supper.     No current facility-administered medications for this visit.      Past Medical History:  Diagnosis Date  . Angina    just started recently ,2-3 days ago-went to see Dr. Einar Gip 05/14/2011  . Arthritis    bil knees  . Diabetes mellitus    was at one time lost weight amd no problem with it now. About 2-3 yrs ago  . Dysrhythmia    2-3 yrs ago  . Headache(784.0)    occas  . Hypertension   .  Mastoiditis    right ear  . Pacemaker 11/29/2012   dual chamber  . Palpitations     ROS:   All systems reviewed and negative except as noted in the HPI.   Past Surgical History:  Procedure Laterality Date  . BIV UPGRADE N/A 12/04/2017   Procedure: Dual Chamber PPM upgrading to BIV PPM;  Surgeon: Evans Lance, MD;  Location: [place] [facility];  Service: Cardiovascular;  Laterality: N/A;  . CYSTECTOMY     under chin 1983  . FINGER SURGERY     states he cut finger off when cutting wood  . INSERT / REPLACE / REMOVE PACEMAKER  11/29/2012   [employer]   . NASAL SINUS SURGERY     left side  . PERMANENT PACEMAKER INSERTION N/A 11/29/2012   Procedure: PERMANENT PACEMAKER INSERTION;  Surgeon: Sanda Klein, MD;  Location: [place] [facility];  Service: Cardiovascular;  Laterality: N/A;  . TYMPANOPLASTY  05/19/2011   Procedure: TYMPANOPLASTY;  Surgeon: Thornell Sartorius, MD;  Location: [facility];  Service: ENT;  Laterality: Right;  Exploratory Tympanotomy     Family History  Problem Relation Age of Onset  . Heart attack Father  Social History   Socioeconomic History  . Marital status: Married    Spouse name: Not on file  . Number of children: Not on file  . Years of education: Not on file  . Highest education level: Not on file  Occupational History  . Not on file  Social Needs  . Financial resource strain: Not on file  . Food insecurity:    Worry: Not on file    Inability: Not on file  . Transportation needs:    Medical: Not on file    Non-medical: Not on file  Tobacco Use  . Smoking status: Never Smoker  . Smokeless tobacco: Never Used  Substance and Sexual Activity  . Alcohol use: No  . Drug use: No  . Sexual activity: Not on file  Lifestyle  . Physical activity:    Days per week: Not on file    Minutes per session: Not on file  . Stress: Not on file  Relationships  . Social connections:    Talks on phone: Not on file    Gets together: Not on file     Attends religious service: Not on file    Active member of club or organization: Not on file    Attends meetings of clubs or organizations: Not on file    Relationship status: Not on file  . Intimate partner violence:    Fear of current or ex partner: Not on file    Emotionally abused: Not on file    Physically abused: Not on file    Forced sexual activity: Not on file  Other Topics Concern  . Not on file  Social History Narrative  . Not on file     BP 124/76   Pulse 89   Ht 5\' 11"  (1.803 m)   Wt 216 lb (98 kg)   SpO2 94%   BMI 30.13 kg/m   Physical Exam:  Well appearing NAD HEENT: Unremarkable Neck:  No JVD, no thyromegally Lymphatics:  No adenopathy Back:  No CVA tenderness Lungs:  Clear with no wheezes. HEART:  Regular rate rhythm, no murmurs, no rubs, no clicks Abd:  soft, positive bowel sounds, no organomegally, no rebound, no guarding Ext:  2 plus pulses, no edema, no cyanosis, no clubbing Skin:  No rashes no nodules Neuro:  CN II through XII intact, motor grossly intact  EKG - none  DEVICE  Normal device function.  See PaceArt for details. AV delay at 160/180 as he was pacing at 340.  Assess/Plan: 1. Pacing induced LBBB/LV dysfunction - we discussed the options from extraction/insertion of a new lead to epicardial lead placement. I have reviewed the risks/benefits/goals/expectations of both surgical options and he wishes to proceed. I spent 25 minutes with over 50% face to face time discussing the options with the patient.  Ricky Barnett.D.

## 2018-01-01 NOTE — Patient Instructions (Addendum)
Medication Instructions:  Your physician recommends that you continue on your current medications as directed. Please refer to the Current Medication list given to you today.  Labwork: You will get lab work today:  BMP and CBC.  Testing/Procedures: Your physician has recommended that you upgrade to a biventricular pacemaker  Follow-Up: You will follow up with device clinic 10-14 days after your procedure for a wound check.  You will follow up with Dr. Lovena Le 91 days after your procedure.  Any Other Special Instructions Will Be Listed Below (If Applicable).  Please arrive at the [facility] main entrance of [facility] hospital at:  xxx  Use the CHG surgical scrub as directed  Do not eat or drink after midnight prior to procedure  Do not take any your ELIQUIS for 24 hours prior to your procedure.  On the morning of your procedure take ONLY your metoprolol and your ENTRESTO.  Plan for one night stay  You will need someone to drive you home at discharge  If you need a refill on your cardiac medications before your next appointment, please call your pharmacy.

## 2018-01-02 LAB — BASIC METABOLIC PANEL
BUN / CREAT RATIO: 17 (ref 10–24)
BUN: 36 mg/dL — AB (ref 8–27)
CALCIUM: 9.2 mg/dL (ref 8.6–10.2)
CHLORIDE: 104 mmol/L (ref 96–106)
CO2: 21 mmol/L (ref 20–29)
CREATININE: 2.06 mg/dL — AB (ref 0.76–1.27)
GFR calc non Af Amer: 30 mL/min/{1.73_m2} — ABNORMAL LOW (ref >59)
GFR, EST AFRICAN AMERICAN: 35 mL/min/{1.73_m2} — AB (ref >59)
Glucose: 90 mg/dL (ref 65–99)
Potassium: 4.7 mmol/L (ref 3.5–5.2)
Sodium: 143 mmol/L (ref 134–144)

## 2018-01-02 LAB — CBC WITH DIFFERENTIAL/PLATELET
BASOS ABS: 0.1 10*3/uL (ref 0.0–0.2)
Basos: 1 %
EOS (ABSOLUTE): 0 10*3/uL (ref 0.0–0.4)
EOS: 1 %
HEMATOCRIT: 42.8 % (ref 37.5–51.0)
HEMOGLOBIN: 13.9 g/dL (ref 13.0–17.7)
Immature Grans (Abs): 0 10*3/uL (ref 0.0–0.1)
Immature Granulocytes: 0 %
Lymphocytes Absolute: 1.9 10*3/uL (ref 0.7–3.1)
Lymphs: 31 %
MCH: 26.6 pg (ref 26.6–33.0)
MCHC: 32.5 g/dL (ref 31.5–35.7)
MCV: 82 fL (ref 79–97)
MONOCYTES: 12 %
Monocytes Absolute: 0.7 10*3/uL (ref 0.1–0.9)
NEUTROS ABS: 3.2 10*3/uL (ref 1.4–7.0)
Neutrophils: 55 %
Platelets: 270 10*3/uL (ref 150–450)
RBC: 5.23 x10E6/uL (ref 4.14–5.80)
RDW: 15.2 % (ref 12.3–15.4)
WBC: 5.9 10*3/uL (ref 3.4–10.8)

## 2018-01-04 ENCOUNTER — Other Ambulatory Visit: Payer: Self-pay

## 2018-01-04 NOTE — Patient Outreach (Signed)
[facility]) Care Management  01/04/2018  KLAUS CASTENEDA June 03, 1940 [id number]  BSW placed a call to the patient today, spoke with wife Pamala Hurry. HIPAA identifiers confirmed. Pamala Hurry states the patient will receive surgery either the 15th or 26th of this month. Pamala Hurry is awaiting a call from the physician office to confirm date. BSW discussed opportunity to access [employer] benefit after discharge. Pamala Hurry stated understanding and would like to plan for this. Pamala Hurry has not heard from [facility] regarding eligibility interview. BSW encouraged Pamala Hurry to contact [facility] to arranged interview. Pamala Hurry stated understanding.  Plan: BSW to update [facility] RN CM [name] of surgery plans. BSW will plan to contact the patient in the next month to assist with arranging [employer] meals. Pamala Hurry has this BSW's contact information and is aware she may call BSW directly if needed prior to the next scheduled call.  Daneen Schick, BSW, CDP [facility] [phone]

## 2018-01-05 ENCOUNTER — Telehealth: Payer: Self-pay

## 2018-01-05 NOTE — Telephone Encounter (Signed)
LMTCB

## 2018-01-05 NOTE — Telephone Encounter (Signed)
-----   Message from Evans Lance, MD sent at 01/03/2018  8:05 PM EST ----- Reduce lasix to 40 mg daily

## 2018-01-07 MED ORDER — FUROSEMIDE 40 MG PO TABS: 40.0000 mg | ORAL_TABLET | Freq: Every day | ORAL | 3 refills | Status: DC

## 2018-01-07 NOTE — Telephone Encounter (Signed)
Follow Up   Pt's wife returning call to nurse

## 2018-01-07 NOTE — Telephone Encounter (Signed)
Spoke to patient's wife and informed her of Dr Tanna Furry recommendation to reduce Lasix to 40 mg daily, reflective of elevated kidney function.  She verbalized understanding.

## 2018-01-08 ENCOUNTER — Telehealth: Payer: Self-pay

## 2018-01-08 NOTE — Telephone Encounter (Signed)
Call placed to [facility] family.  Spoke with Pt and wife.  1.  Lasix reduced to 40 mg daily at last OV  2.  Procedure scheduled for January 15, 2018 at 3:00 pm. Following instructions given:  Please arrive to Admitting at [facility] at 12:30 pm on January 15, 2018 You may have a light breakfast before 7:00 am.  Do not eat or drink after 7:00 am on the day of your procedure.  You may have small sips of water as needed for dry mouth but try to limit your intake Hold your Eliquis for 24 hours prior to your procedure.  Your last dose will be January 13, 2018 your PM dose. On the AM of your procedure ONLY TAKE:  Entresto and metoprolol Plan for one night stay You will need someone to drive you home at discharge   Pt and wife indicate understanding.  Pt lab work is completed.  Pt was given surgical scrub at last OV.  Notified wife to call this nurse if any additional questions.

## 2018-01-08 NOTE — Telephone Encounter (Signed)
-----   Message from Evans Lance, MD sent at 01/03/2018  8:05 PM EST ----- Reduce lasix to 40 mg daily

## 2018-01-12 NOTE — Pre-Procedure Instructions (Signed)
Ricky Barnett  01/12/2018      [id number] [address] [place], [place] [address] Phone: [phone] Fax: [place], [place] [address] Phone: [phone] Fax: [phone]    Your procedure is scheduled on Friday, November 15th from 3:00pm-7:00pm.  Report to [facility] Admitting at 1:00 P.M.  Call this number if you have problems the morning of surgery:  [phone]   Remember:  Do not eat or drink after midnight.    Take these medicines the morning of surgery with A SIP OF WATER  metoprolol succinate (TOPROL-XL)  acetaminophen (TYLENOL)-if needed  Follow your surgeon's instructions on when to stop/resume Eliquis.    As of today, STOP taking any Aspirin(unless otherwise instructed by your surgeon), Aleve, Naproxen, Ibuprofen, Motrin, Advil, Goody's, BC's, all herbal medications, fish oil, and all vitamins.    WHAT DO I DO ABOUT MY DIABETES MEDICATION?   Ricky Kitchen Do not take oral diabetes medicines (pills) the morning of surgery. DO NOT TAKE YOUR metFORMIN (GLUCOPHAGE).    How to Manage Your Diabetes Before and After Surgery  Why is it important to control my blood sugar before and after surgery? . Improving blood sugar levels before and after surgery helps healing and can limit problems. . A way of improving blood sugar control is eating a healthy diet by: o  Eating less sugar and carbohydrates o  Increasing activity/exercise o  Talking with your doctor about reaching your blood sugar goals . High blood sugars (greater than 180 mg/dL) can raise your risk of infections and slow your recovery, so you will need to focus on controlling your diabetes during the weeks before surgery. . Make sure that the doctor who takes care of your diabetes knows about your planned surgery including  the date and location.  How do I manage my blood sugar before surgery? . Check your blood sugar at least 4 times a day, starting 2 days before surgery, to make sure that the level is not too high or low. o Check your blood sugar the morning of your surgery when you wake up and every 2 hours until you get to the Short Stay unit. . If your blood sugar is less than 70 mg/dL, you will need to treat for low blood sugar: o Do not take insulin. o Treat a low blood sugar (less than 70 mg/dL) with  cup of clear juice (cranberry or apple), 4 glucose tablets, OR glucose gel. o Recheck blood sugar in 15 minutes after treatment (to make sure it is greater than 70 mg/dL). If your blood sugar is not greater than 70 mg/dL on recheck, call [phone] for further instructions. . Report your blood sugar to the short stay nurse when you get to Short Stay.  . If you are admitted to the hospital after surgery: o Your blood sugar will be checked by the staff and you will probably be given insulin after surgery (instead of oral diabetes medicines) to make sure you have good blood sugar levels. o The goal for blood sugar control after surgery is 80-180 mg/dL.          Do not wear jewelry.  Do not wear lotions, powders, or colognes, or deodorant.  Men may shave face and neck.  Do not bring valuables to the hospital.  [facility] is not responsible for any belongings or valuables.  Hearing aids, eyeglasses, contacts, dentures or bridgework may not be worn into surgery.  Leave your suitcase in the car.  After surgery it may be brought to your room.  For patients admitted to the hospital, discharge time will be determined by your treatment team.  Patients discharged the day of surgery will not be allowed to drive home.   Special instructions:  [facility]- Preparing For Surgery  Before surgery, you can play an important role. Because skin is not sterile, your skin needs to be as free of germs as possible.  You can reduce the number of germs on your skin by washing with CHG (chlorahexidine gluconate) Soap before surgery.  CHG is an antiseptic cleaner which kills germs and bonds with the skin to continue killing germs even after washing.    Oral Hygiene is also important to reduce your risk of infection.  Remember - BRUSH YOUR TEETH THE MORNING OF SURGERY WITH YOUR REGULAR TOOTHPASTE  Please do not use if you have an allergy to CHG or antibacterial soaps. If your skin becomes reddened/irritated stop using the CHG.  Do not shave (including legs and underarms) for at least 48 hours prior to first CHG shower. It is OK to shave your face.  Please follow these instructions carefully.   1. Shower the NIGHT BEFORE SURGERY and the MORNING OF SURGERY with CHG.   2. If you chose to wash your hair, wash your hair first as usual with your normal shampoo.  3. After you shampoo, rinse your hair and body thoroughly to remove the shampoo.  4. Use CHG as you would any other liquid soap. You can apply CHG directly to the skin and wash gently with a scrungie or a clean washcloth.   5. Apply the CHG Soap to your body ONLY FROM THE NECK DOWN.  Do not use on open wounds or open sores. Avoid contact with your eyes, ears, mouth and genitals (private parts). Wash Face and genitals (private parts)  with your normal soap.  6. Wash thoroughly, paying special attention to the area where your surgery will be performed.  7. Thoroughly rinse your body with warm water from the neck down.  8. DO NOT shower/wash with your normal soap after using and rinsing off the CHG Soap.  9. Pat yourself dry with a CLEAN TOWEL.  10. Wear CLEAN PAJAMAS to bed the night before surgery, wear comfortable clothes the morning of surgery  11. Place CLEAN SHEETS on your bed the night of your first shower and DO NOT SLEEP WITH PETS.    Day of Surgery:  Do not apply any deodorants/lotions.  Please wear clean clothes to the hospital/surgery  center.   Remember to brush your teeth WITH YOUR REGULAR TOOTHPASTE.  Please read over the following fact sheets that you were given.

## 2018-01-13 ENCOUNTER — Other Ambulatory Visit: Payer: Self-pay | Admitting: *Deleted

## 2018-01-13 ENCOUNTER — Encounter (HOSPITAL_COMMUNITY): Payer: Self-pay

## 2018-01-13 ENCOUNTER — Encounter (HOSPITAL_COMMUNITY)
Admission: RE | Admit: 2018-01-13 | Discharge: 2018-01-13 | Disposition: A | Discharge status: Home / Self Care | Payer: Medicare HMO | Source: Ambulatory Visit | Attending: Internal Medicine | Admitting: Internal Medicine

## 2018-01-13 ENCOUNTER — Other Ambulatory Visit: Payer: Self-pay

## 2018-01-13 DIAGNOSIS — Z01812 Encounter for preprocedural laboratory examination: Secondary | ICD-10-CM

## 2018-01-13 HISTORY — DX: Chronic kidney disease, unspecified: N18.9

## 2018-01-13 HISTORY — DX: Heart disease, unspecified: I51.9

## 2018-01-13 LAB — GLUCOSE, CAPILLARY: GLUCOSE-CAPILLARY: 120 mg/dL — AB (ref 70–99)

## 2018-01-13 LAB — HEMOGLOBIN A1C
HEMOGLOBIN A1C: 5.9 % — AB (ref 4.8–5.6)
MEAN PLASMA GLUCOSE: 122.63 mg/dL

## 2018-01-13 LAB — ABO/RH: ABO/RH(D): O POS

## 2018-01-13 LAB — SURGICAL PCR SCREEN
MRSA, PCR: NEGATIVE
Staphylococcus aureus: NEGATIVE

## 2018-01-13 NOTE — Progress Notes (Signed)
PCP - Dr. Ashby Dawes  Cardiologist - Dr. Sallyanne Kuster  Chest x-ray - 12/08/17 1-view (E)  EKG - 1024/19 (E)  Stress Test - 12/10/17 (E)  ECHO - 11/17/17 (E)  Cardiac Cath - Denies  AICD- na PM- Yes LOOP- na  Sleep Study - Denies CPAP - None  LABS- 01/13/18: PCR 01/01/18: CBC, BMP- within 30 days, per sx order 01/15/18: PT  ASA- Denies Eliquis- LD- 11/13  HA1C- 01/13/18 Fasting Blood Sugar - today, 120 Checks Blood Sugar ___0__ times a day  Anesthesia- Yes- cardiac history  Pt denies having chest pain, sob, or fever at this time. All instructions explained to the pt, with a verbal understanding of the material. Pt agrees to go over the instructions while at home for a better understanding. The opportunity to ask questions was provided.

## 2018-01-13 NOTE — Patient Outreach (Signed)
[facility]) Care Management  01/13/2018  Ricky Barnett 1940/09/26 [id number]   Call placed to member/wife to follow up on upcoming surgery for pacemaker, scheduled for 11/15.  Wife report member has pre-op testing today, denies any questions/concerns.  State they have not received any follow up calls regarding interview for [facility].  Will follow up with BSW.  She also request meals post discharge.  Advised that this care manager will notify BSW, also advised that this care manager will follow up with member after discharge.  She verbalizes understanding.  [name], [place], MSN [place] [phone]

## 2018-01-14 ENCOUNTER — Ambulatory Visit (HOSPITAL_COMMUNITY): Admission: RE | Admit: 2018-01-14 | Payer: Medicare HMO | Source: Ambulatory Visit

## 2018-01-14 ENCOUNTER — Encounter (HOSPITAL_COMMUNITY): Payer: Self-pay

## 2018-01-14 NOTE — Anesthesia Preprocedure Evaluation (Addendum)
Anesthesia Evaluation  Patient identified by MRN, date of birth, ID band Patient awake    Reviewed: Allergy & Precautions, NPO status , Patient's Chart, lab work & pertinent test results  Airway Mallampati: I  TM Distance: >3 FB Neck ROM: Full    Dental   Pulmonary    Pulmonary exam normal        Cardiovascular hypertension, Pt. on medications + angina +CHF  Normal cardiovascular exam+ dysrhythmias + pacemaker      Neuro/Psych    GI/Hepatic   Endo/Other  diabetes  Renal/GU Renal InsufficiencyRenal disease     Musculoskeletal   Abdominal   Peds  Hematology   Anesthesia Other Findings   Reproductive/Obstetrics                            Anesthesia Physical Anesthesia Plan  ASA: III  Anesthesia Plan: General   Post-op Pain Management:    Induction: Intravenous  PONV Risk Score and Plan: 2 and Treatment may vary due to age or medical condition and Ondansetron  Airway Management Planned: Oral ETT  Additional Equipment: Arterial line  Intra-op Plan:   Post-operative Plan: Extubation in OR  Informed Consent: I have reviewed the patients History and Physical, chart, labs and discussed the procedure including the risks, benefits and alternatives for the proposed anesthesia with the patient or authorized representative who has indicated his/her understanding and acceptance.     Plan Discussed with: CRNA and Surgeon  Anesthesia Plan Comments: (CHB, s/p PPM has developed PM induced LV dysfunction. Follows with Dr. Lovena Le. Echo 11/2017 notes mildly dilated LV with mild LV hypertrophy. EF 20%, diffuse hypokinesis with septal-lateral dyssynchrony. Moderate-severe MR, suspect functional. Severe pulmonary hypertension. Myoview 12/10/17 showed large region of scar however no reversible ischemia. Marked left ventricular dilatation and severe global hypokinesis. LVEF 21%. Non invasive risk  stratification*: High)     Anesthesia Quick Evaluation

## 2018-01-15 ENCOUNTER — Encounter (HOSPITAL_COMMUNITY): Payer: Self-pay | Admitting: Certified Registered Nurse Anesthetist

## 2018-01-15 ENCOUNTER — Encounter (HOSPITAL_COMMUNITY)
Admission: RE | Disposition: A | Discharge status: Home Health | Payer: Self-pay | Source: Ambulatory Visit | Attending: Internal Medicine

## 2018-01-15 ENCOUNTER — Inpatient Hospital Stay (HOSPITAL_COMMUNITY): Payer: Medicare HMO | Admitting: Physician Assistant

## 2018-01-15 ENCOUNTER — Inpatient Hospital Stay (HOSPITAL_COMMUNITY)
Admission: RE | Admit: 2018-01-15 | Discharge: 2018-01-20 | DRG: 243 | Disposition: A | Discharge status: Home Health | Payer: Medicare HMO | Source: Ambulatory Visit | Attending: Internal Medicine | Admitting: Internal Medicine

## 2018-01-15 ENCOUNTER — Inpatient Hospital Stay (HOSPITAL_COMMUNITY): Payer: Medicare HMO | Admitting: Certified Registered Nurse Anesthetist

## 2018-01-15 ENCOUNTER — Inpatient Hospital Stay (HOSPITAL_COMMUNITY): Payer: Medicare HMO

## 2018-01-15 DIAGNOSIS — I5042 Chronic combined systolic (congestive) and diastolic (congestive) heart failure: Secondary | ICD-10-CM | POA: Diagnosis present

## 2018-01-15 DIAGNOSIS — I82B12 Acute embolism and thrombosis of left subclavian vein: Secondary | ICD-10-CM | POA: Diagnosis not present

## 2018-01-15 DIAGNOSIS — Y718 Miscellaneous cardiovascular devices associated with adverse incidents, not elsewhere classified: Secondary | ICD-10-CM | POA: Diagnosis not present

## 2018-01-15 DIAGNOSIS — Z79899 Other long term (current) drug therapy: Secondary | ICD-10-CM

## 2018-01-15 DIAGNOSIS — I5022 Chronic systolic (congestive) heart failure: Secondary | ICD-10-CM | POA: Diagnosis not present

## 2018-01-15 DIAGNOSIS — I442 Atrioventricular block, complete: Secondary | ICD-10-CM | POA: Diagnosis not present

## 2018-01-15 DIAGNOSIS — R410 Disorientation, unspecified: Secondary | ICD-10-CM | POA: Diagnosis not present

## 2018-01-15 DIAGNOSIS — S25392A Other specified injury of left innominate or subclavian vein, initial encounter: Secondary | ICD-10-CM | POA: Diagnosis not present

## 2018-01-15 DIAGNOSIS — Z7984 Long term (current) use of oral hypoglycemic drugs: Secondary | ICD-10-CM

## 2018-01-15 DIAGNOSIS — Y832 Surgical operation with anastomosis, bypass or graft as the cause of abnormal reaction of the patient, or of later complication, without mention of misadventure at the time of the procedure: Secondary | ICD-10-CM | POA: Diagnosis not present

## 2018-01-15 DIAGNOSIS — T82897A Other specified complication of cardiac prosthetic devices, implants and grafts, initial encounter: Secondary | ICD-10-CM | POA: Diagnosis not present

## 2018-01-15 DIAGNOSIS — I447 Left bundle-branch block, unspecified: Secondary | ICD-10-CM | POA: Diagnosis not present

## 2018-01-15 DIAGNOSIS — Z950 Presence of cardiac pacemaker: Secondary | ICD-10-CM

## 2018-01-15 DIAGNOSIS — J9811 Atelectasis: Secondary | ICD-10-CM | POA: Diagnosis not present

## 2018-01-15 DIAGNOSIS — I97618 Postprocedural hemorrhage and hematoma of a circulatory system organ or structure following other circulatory system procedure: Secondary | ICD-10-CM | POA: Diagnosis not present

## 2018-01-15 DIAGNOSIS — Z23 Encounter for immunization: Secondary | ICD-10-CM

## 2018-01-15 DIAGNOSIS — I110 Hypertensive heart disease with heart failure: Secondary | ICD-10-CM | POA: Diagnosis not present

## 2018-01-15 DIAGNOSIS — Z7901 Long term (current) use of anticoagulants: Secondary | ICD-10-CM

## 2018-01-15 DIAGNOSIS — I5043 Acute on chronic combined systolic (congestive) and diastolic (congestive) heart failure: Secondary | ICD-10-CM | POA: Diagnosis not present

## 2018-01-15 DIAGNOSIS — I97610 Postprocedural hemorrhage and hematoma of a circulatory system organ or structure following a cardiac catheterization: Secondary | ICD-10-CM | POA: Diagnosis not present

## 2018-01-15 HISTORY — PX: PACEMAKER LEAD REMOVAL: SHX5064

## 2018-01-15 LAB — GLUCOSE, CAPILLARY
GLUCOSE-CAPILLARY: 100 mg/dL — AB (ref 70–99)
GLUCOSE-CAPILLARY: 104 mg/dL — AB (ref 70–99)

## 2018-01-15 LAB — POCT I-STAT 7, (LYTES, BLD GAS, ICA,H+H)
Acid-base deficit: 5 mmol/L — ABNORMAL HIGH (ref 0.0–2.0)
Acid-base deficit: 5 mmol/L — ABNORMAL HIGH (ref 0.0–2.0)
BICARBONATE: 21.9 mmol/L (ref 20.0–28.0)
Bicarbonate: 21.3 mmol/L (ref 20.0–28.0)
Calcium, Ion: 1.11 mmol/L — ABNORMAL LOW (ref 1.15–1.40)
Calcium, Ion: 1.15 mmol/L (ref 1.15–1.40)
HCT: 34 % — ABNORMAL LOW (ref 39.0–52.0)
HCT: 35 % — ABNORMAL LOW (ref 39.0–52.0)
Hemoglobin: 11.6 g/dL — ABNORMAL LOW (ref 13.0–17.0)
Hemoglobin: 11.9 g/dL — ABNORMAL LOW (ref 13.0–17.0)
O2 SAT: 100 %
O2 Saturation: 100 %
PCO2 ART: 39.9 mmHg (ref 32.0–48.0)
PH ART: 7.313 — AB (ref 7.350–7.450)
PO2 ART: 413 mmHg — AB (ref 83.0–108.0)
Patient temperature: 35.8
Potassium: 4.3 mmol/L (ref 3.5–5.1)
Potassium: 5 mmol/L (ref 3.5–5.1)
SODIUM: 142 mmol/L (ref 135–145)
Sodium: 144 mmol/L (ref 135–145)
TCO2: 23 mmol/L (ref 22–32)
TCO2: 23 mmol/L (ref 22–32)
pCO2 arterial: 42.5 mmHg (ref 32.0–48.0)
pH, Arterial: 7.331 — ABNORMAL LOW (ref 7.350–7.450)
pO2, Arterial: 213 mmHg — ABNORMAL HIGH (ref 83.0–108.0)

## 2018-01-15 LAB — PREPARE RBC (CROSSMATCH)

## 2018-01-15 LAB — PROTIME-INR
INR: 1.31
PROTHROMBIN TIME: 16.2 s — AB (ref 11.4–15.2)

## 2018-01-15 SURGERY — REMOVAL, ELECTRODE LEAD, CARDIAC PACEMAKER, WITHOUT REPLACEMENT
Anesthesia: General | Site: Chest

## 2018-01-15 MED ORDER — MEPERIDINE HCL 50 MG/ML IJ SOLN: 6.2500 mg | INTRAMUSCULAR | Status: DC | PRN

## 2018-01-15 MED ORDER — CHLORHEXIDINE GLUCONATE 4 % EX LIQD: 60.0000 mL | Freq: Once | CUTANEOUS | Status: DC

## 2018-01-15 MED ORDER — SODIUM CHLORIDE 0.9 % IV SOLN
INTRAVENOUS | Status: AC
  Filled 2018-01-15: qty 1.2

## 2018-01-15 MED ORDER — SODIUM CHLORIDE 0.9 % IV SOLN: INTRAVENOUS | Status: DC

## 2018-01-15 MED ORDER — IODIXANOL 320 MG/ML IV SOLN
INTRAVENOUS | Status: DC | PRN
  Administered 2018-01-15: 24 mL via INTRAVENOUS
  Administered 2018-01-15: 50 mL via INTRAVENOUS

## 2018-01-15 MED ORDER — ONDANSETRON HCL 4 MG/2ML IJ SOLN: 4.0000 mg | Freq: Four times a day (QID) | INTRAMUSCULAR | Status: DC | PRN

## 2018-01-15 MED ORDER — CEFAZOLIN SODIUM-DEXTROSE 2-4 GM/100ML-% IV SOLN
2.0000 g | INTRAVENOUS | Status: AC
  Administered 2018-01-15: 2 g via INTRAVENOUS

## 2018-01-15 MED ORDER — DEXAMETHASONE SODIUM PHOSPHATE 10 MG/ML IJ SOLN
INTRAMUSCULAR | Status: DC | PRN
  Administered 2018-01-15: 8 mg via INTRAVENOUS

## 2018-01-15 MED ORDER — ADULT MULTIVITAMIN W/MINERALS CH
1.0000 | ORAL_TABLET | Freq: Every day | ORAL | Status: DC
  Administered 2018-01-16 – 2018-01-20 (×5): 1 via ORAL
  Filled 2018-01-15 (×6): qty 1

## 2018-01-15 MED ORDER — ROCURONIUM BROMIDE 50 MG/5ML IV SOSY
PREFILLED_SYRINGE | INTRAVENOUS | Status: AC
  Filled 2018-01-15: qty 5

## 2018-01-15 MED ORDER — SODIUM CHLORIDE 0.9 % IV SOLN
INTRAVENOUS | Status: AC
  Filled 2018-01-15 (×2): qty 2

## 2018-01-15 MED ORDER — LACTATED RINGERS IV SOLN
INTRAVENOUS | Status: DC | PRN
  Administered 2018-01-15: 14:00:00 via INTRAVENOUS

## 2018-01-15 MED ORDER — ROCURONIUM BROMIDE 50 MG/5ML IV SOSY
PREFILLED_SYRINGE | INTRAVENOUS | Status: AC
  Filled 2018-01-15: qty 10

## 2018-01-15 MED ORDER — CEFAZOLIN SODIUM-DEXTROSE 2-4 GM/100ML-% IV SOLN
INTRAVENOUS | Status: AC
  Filled 2018-01-15: qty 100

## 2018-01-15 MED ORDER — FENTANYL CITRATE (PF) 250 MCG/5ML IJ SOLN
INTRAMUSCULAR | Status: AC
  Filled 2018-01-15: qty 5

## 2018-01-15 MED ORDER — PRAVASTATIN SODIUM 40 MG PO TABS
40.0000 mg | ORAL_TABLET | Freq: Every day | ORAL | Status: DC
  Administered 2018-01-16 – 2018-01-20 (×5): 40 mg via ORAL
  Filled 2018-01-15 (×6): qty 1

## 2018-01-15 MED ORDER — SUGAMMADEX SODIUM 200 MG/2ML IV SOLN
INTRAVENOUS | Status: DC | PRN
  Administered 2018-01-15: 200 mg via INTRAVENOUS

## 2018-01-15 MED ORDER — SODIUM CHLORIDE 0.9% IV SOLUTION
Freq: Once | INTRAVENOUS | Status: AC
  Administered 2018-01-16: 12:00:00 via INTRAVENOUS

## 2018-01-15 MED ORDER — SODIUM CHLORIDE 0.9 % IV SOLN
80.0000 mg | INTRAVENOUS | Status: DC
  Filled 2018-01-15: qty 2

## 2018-01-15 MED ORDER — PHENYLEPHRINE HCL 10 MG/ML IJ SOLN
INTRAMUSCULAR | Status: AC
  Filled 2018-01-15: qty 1

## 2018-01-15 MED ORDER — METOPROLOL SUCCINATE ER 50 MG PO TB24
50.0000 mg | ORAL_TABLET | Freq: Every day | ORAL | Status: DC
  Administered 2018-01-16 – 2018-01-20 (×5): 50 mg via ORAL
  Filled 2018-01-15 (×5): qty 1

## 2018-01-15 MED ORDER — PROPOFOL 10 MG/ML IV BOLUS
INTRAVENOUS | Status: AC
  Filled 2018-01-15: qty 20

## 2018-01-15 MED ORDER — ROCURONIUM BROMIDE 50 MG/5ML IV SOSY
PREFILLED_SYRINGE | INTRAVENOUS | Status: DC | PRN
  Administered 2018-01-15: 15 mg via INTRAVENOUS
  Administered 2018-01-15 (×2): 50 mg via INTRAVENOUS
  Administered 2018-01-15: 15 mg via INTRAVENOUS
  Administered 2018-01-15: 10 mg via INTRAVENOUS

## 2018-01-15 MED ORDER — ONDANSETRON HCL 4 MG/2ML IJ SOLN
INTRAMUSCULAR | Status: AC
  Filled 2018-01-15: qty 2

## 2018-01-15 MED ORDER — TAMSULOSIN HCL 0.4 MG PO CAPS
0.4000 mg | ORAL_CAPSULE | Freq: Every day | ORAL | Status: DC
  Administered 2018-01-16 – 2018-01-19 (×4): 0.4 mg via ORAL
  Filled 2018-01-15 (×4): qty 1

## 2018-01-15 MED ORDER — ACETAMINOPHEN 325 MG PO TABS
325.0000 mg | ORAL_TABLET | ORAL | Status: DC | PRN
  Administered 2018-01-15: 650 mg via ORAL
  Filled 2018-01-15 (×2): qty 2

## 2018-01-15 MED ORDER — SODIUM CHLORIDE 0.9 % IV SOLN
INTRAVENOUS | Status: DC | PRN
  Administered 2018-01-15: 25 ug/min via INTRAVENOUS

## 2018-01-15 MED ORDER — SODIUM CHLORIDE 0.9 % IV SOLN
INTRAVENOUS | Status: DC
  Administered 2018-01-15 (×2): via INTRAVENOUS

## 2018-01-15 MED ORDER — SUGAMMADEX SODIUM 200 MG/2ML IV SOLN
INTRAVENOUS | Status: AC
  Filled 2018-01-15: qty 2

## 2018-01-15 MED ORDER — SODIUM CHLORIDE 0.9% IV SOLUTION: Freq: Once | INTRAVENOUS | Status: DC

## 2018-01-15 MED ORDER — DEXAMETHASONE SODIUM PHOSPHATE 10 MG/ML IJ SOLN
INTRAMUSCULAR | Status: AC
  Filled 2018-01-15: qty 1

## 2018-01-15 MED ORDER — ONDANSETRON HCL 4 MG/2ML IJ SOLN
INTRAMUSCULAR | Status: DC | PRN
  Administered 2018-01-15: 4 mg via INTRAVENOUS

## 2018-01-15 MED ORDER — PHENYLEPHRINE HCL 10 MG/ML IJ SOLN
INTRAMUSCULAR | Status: DC | PRN
  Administered 2018-01-15: 80 ug via INTRAVENOUS

## 2018-01-15 MED ORDER — LIDOCAINE HCL (PF) 1 % IJ SOLN
INTRAMUSCULAR | Status: AC
  Filled 2018-01-15: qty 30

## 2018-01-15 MED ORDER — PROMETHAZINE HCL 25 MG/ML IJ SOLN: 6.2500 mg | INTRAMUSCULAR | Status: DC | PRN

## 2018-01-15 MED ORDER — SODIUM CHLORIDE 0.9 % IV SOLN
INTRAVENOUS | Status: DC | PRN
  Administered 2018-01-15 (×2): 500 mL

## 2018-01-15 MED ORDER — HYDROMORPHONE HCL 1 MG/ML IJ SOLN: 0.2500 mg | INTRAMUSCULAR | Status: DC | PRN

## 2018-01-15 MED ORDER — ONE-DAILY MULTI VITAMINS PO TABS: 1.0000 | ORAL_TABLET | Freq: Every day | ORAL | Status: DC

## 2018-01-15 MED ORDER — PROPOFOL 10 MG/ML IV BOLUS
INTRAVENOUS | Status: DC | PRN
  Administered 2018-01-15: 130 mg via INTRAVENOUS

## 2018-01-15 MED ORDER — PHENYLEPHRINE 40 MCG/ML (10ML) SYRINGE FOR IV PUSH (FOR BLOOD PRESSURE SUPPORT)
PREFILLED_SYRINGE | INTRAVENOUS | Status: AC
  Filled 2018-01-15: qty 10

## 2018-01-15 MED ORDER — CEFAZOLIN SODIUM-DEXTROSE 1-4 GM/50ML-% IV SOLN
1.0000 g | Freq: Four times a day (QID) | INTRAVENOUS | Status: AC
  Administered 2018-01-15 – 2018-01-16 (×2): 1 g via INTRAVENOUS
  Filled 2018-01-15 (×4): qty 50

## 2018-01-15 MED ORDER — LIDOCAINE 2% (20 MG/ML) 5 ML SYRINGE
INTRAMUSCULAR | Status: DC | PRN
  Administered 2018-01-15: 100 mg via INTRAVENOUS

## 2018-01-15 MED ORDER — FUROSEMIDE 40 MG PO TABS
40.0000 mg | ORAL_TABLET | Freq: Every day | ORAL | Status: DC
  Administered 2018-01-17 – 2018-01-20 (×4): 40 mg via ORAL
  Filled 2018-01-15 (×6): qty 1

## 2018-01-15 MED ORDER — LIDOCAINE HCL (PF) 1 % IJ SOLN
INTRAMUSCULAR | Status: DC | PRN
  Administered 2018-01-15: 30 mL via INTRADERMAL

## 2018-01-15 MED ORDER — SACUBITRIL-VALSARTAN 97-103 MG PO TABS
1.0000 | ORAL_TABLET | Freq: Two times a day (BID) | ORAL | Status: DC
  Administered 2018-01-15 – 2018-01-20 (×10): 1 via ORAL
  Filled 2018-01-15 (×10): qty 1

## 2018-01-15 MED ORDER — ALBUMIN HUMAN 5 % IV SOLN
INTRAVENOUS | Status: DC | PRN
  Administered 2018-01-15: 16:00:00 via INTRAVENOUS

## 2018-01-15 MED ORDER — POTASSIUM CHLORIDE CRYS ER 20 MEQ PO TBCR
20.0000 meq | EXTENDED_RELEASE_TABLET | Freq: Every day | ORAL | Status: DC
  Administered 2018-01-16 – 2018-01-20 (×5): 20 meq via ORAL
  Filled 2018-01-15 (×6): qty 1

## 2018-01-15 MED ORDER — FENTANYL CITRATE (PF) 100 MCG/2ML IJ SOLN
INTRAMUSCULAR | Status: DC | PRN
  Administered 2018-01-15: 50 ug via INTRAVENOUS
  Administered 2018-01-15: 100 ug via INTRAVENOUS

## 2018-01-15 MED ORDER — EPHEDRINE SULFATE 50 MG/ML IJ SOLN
INTRAMUSCULAR | Status: DC | PRN
  Administered 2018-01-15: 5 mg via INTRAVENOUS
  Administered 2018-01-15: 10 mg via INTRAVENOUS

## 2018-01-15 SURGICAL SUPPLY — 70 items
BAG BANDED W/RUBBER/TAPE 36X54 (MISCELLANEOUS) ×3 IMPLANT
BAG DECANTER FOR FLEXI CONT (MISCELLANEOUS) ×12 IMPLANT
BLADE 10 SAFETY STRL DISP (BLADE) ×3 IMPLANT
BLADE OSCILLATING /SAGITTAL (BLADE) IMPLANT
BLADE STERNUM SYSTEM 6 (BLADE) ×3 IMPLANT
BNDG COHESIVE 4X5 WHT NS (GAUZE/BANDAGES/DRESSINGS) IMPLANT
CANISTER SUCT 3000ML PPV (MISCELLANEOUS) ×3 IMPLANT
CATH ACUITYPRO 45CM H 9F (CATHETERS) ×3 IMPLANT
CATH ACUITYPRO 45CM W 9F (CATHETERS) ×3 IMPLANT
CONT SPEC 4OZ CLIKSEAL STRL BL (MISCELLANEOUS) ×3 IMPLANT
COVER BACK TABLE 60X90IN (DRAPES) ×3 IMPLANT
COVER SURGICAL LIGHT HANDLE (MISCELLANEOUS) ×6 IMPLANT
COVER WAND RF STERILE (DRAPES) ×3 IMPLANT
CRT-P PPM VALITUDE U128 (Pacemaker) ×3 IMPLANT
DEVICE CRT-P PPM VALITUDE U128 (Pacemaker) ×1 IMPLANT
DEVICE LCKNG LEAD CARDIAC (CATHETERS) ×1 IMPLANT
DRAPE CARDIOVASCULAR INCISE (DRAPES) ×2
DRAPE SRG 135X102X78XABS (DRAPES) ×1 IMPLANT
DRSG COVADERM 4X6 (GAUZE/BANDAGES/DRESSINGS) ×3 IMPLANT
DRSG OPSITE 6X11 MED (GAUZE/BANDAGES/DRESSINGS) IMPLANT
DRSG TEGADERM 4X4.75 (GAUZE/BANDAGES/DRESSINGS) ×3 IMPLANT
ELECT BLADE 4.0 EZ CLEAN MEGAD (MISCELLANEOUS) ×3
ELECT REM PT RETURN 9FT ADLT (ELECTROSURGICAL) ×6
ELECTRODE BLDE 4.0 EZ CLN MEGD (MISCELLANEOUS) ×1 IMPLANT
ELECTRODE REM PT RTRN 9FT ADLT (ELECTROSURGICAL) ×2 IMPLANT
FELT TEFLON 1X6 (MISCELLANEOUS) ×3 IMPLANT
GAUZE 4X4 16PLY RFD (DISPOSABLE) ×12 IMPLANT
GAUZE SPONGE 2X2 8PLY STRL LF (GAUZE/BANDAGES/DRESSINGS) ×1 IMPLANT
GAUZE SPONGE 4X4 12PLY STRL (GAUZE/BANDAGES/DRESSINGS) IMPLANT
GLOVE BIOGEL PI IND STRL 7.5 (GLOVE) ×1 IMPLANT
GLOVE BIOGEL PI INDICATOR 7.5 (GLOVE) ×2
GLOVE ECLIPSE 8.0 STRL XLNG CF (GLOVE) ×3 IMPLANT
GOWN STRL REUS W/ TWL LRG LVL3 (GOWN DISPOSABLE) ×2 IMPLANT
GOWN STRL REUS W/ TWL XL LVL3 (GOWN DISPOSABLE) ×1 IMPLANT
GOWN STRL REUS W/TWL LRG LVL3 (GOWN DISPOSABLE) ×4
GOWN STRL REUS W/TWL XL LVL3 (GOWN DISPOSABLE) ×2
INGEVITY MRI 7742-59CM (Lead) ×3 IMPLANT
KIT TURNOVER KIT B (KITS) ×3 IMPLANT
LEAD ACUITY X4 4671 (Lead) ×3 IMPLANT
LEAD ACUITY X4 4677 (Lead) ×3 IMPLANT
LEAD PACING INGEVITY MRI 59CM (Lead) ×1 IMPLANT
NEEDLE PERC 18GX7CM (NEEDLE) ×3 IMPLANT
PAD ARMBOARD 7.5X6 YLW CONV (MISCELLANEOUS) ×6 IMPLANT
PAD ELECT DEFIB RADIOL ZOLL (MISCELLANEOUS) ×3 IMPLANT
PENCIL BUTTON HOLSTER BLD 10FT (ELECTRODE) ×3 IMPLANT
REMOVAL LLD CARDIAC LEAD EZ (CATHETERS) ×3
SHEATH CLASSIC 9.5F (SHEATH) ×3 IMPLANT
SHEATH CLASSIC 9F 25CM (SHEATH) ×3 IMPLANT
SHEATH PINNACLE 6F 10CM (SHEATH) ×3 IMPLANT
SHEATH TIGHTRAIL MECH (SHEATH) ×1
SHEATH TIGHTRAIL MECH 11F (SHEATH) ×2 IMPLANT
SHEATH TIGHTRAIL SUB-C (SHEATH) ×3 IMPLANT
SLING ARM FOAM STRAP LRG (SOFTGOODS) ×3 IMPLANT
SPONGE GAUZE 2X2 STER 10/PKG (GAUZE/BANDAGES/DRESSINGS) ×2
SPONGE LAP 18X18 X RAY DECT (DISPOSABLE) ×3 IMPLANT
SUT PROLENE 2 0 SH DA (SUTURE) IMPLANT
SUT PROLENE 4 0 RB 1 (SUTURE) ×6
SUT PROLENE 4-0 RB1 .5 CRCL 36 (SUTURE) ×3 IMPLANT
SUT PROLENE 5 0 C 1 36 (SUTURE) ×6 IMPLANT
SUT SILK 2 0 SH CR/8 (SUTURE) ×3 IMPLANT
SUT VIC AB 3-0 SH 18 (SUTURE) ×3 IMPLANT
SYR BULB IRRIGATION 50ML (SYRINGE) ×3 IMPLANT
SYRINGE 20CC LL (MISCELLANEOUS) ×3 IMPLANT
TOWEL OR 17X24 6PK STRL BLUE (TOWEL DISPOSABLE) ×6 IMPLANT
TOWEL OR 17X26 10 PK STRL BLUE (TOWEL DISPOSABLE) ×6 IMPLANT
TRAY FOLEY SLVR 16FR TEMP STAT (SET/KITS/TRAYS/PACK) ×3 IMPLANT
TUBE CONNECTING 12'X1/4 (SUCTIONS) ×1
TUBE CONNECTING 12X1/4 (SUCTIONS) ×2 IMPLANT
WIRE MAILMAN 182CM (WIRE) ×6 IMPLANT
YANKAUER SUCT BULB TIP NO VENT (SUCTIONS) ×3 IMPLANT

## 2018-01-15 NOTE — Discharge Instructions (Signed)
° ° °  Supplemental Discharge Instructions for  Pacemaker/Defibrillator Patients  Activity No heavy lifting or vigorous activity with your left/right arm for 6 to 8 weeks.  Do not raise your left/right arm above your head for one week.  Gradually raise your affected arm as drawn below.              01/19/18                   01/20/18                 01/21/18                 01/22/18 __  NO DRIVING for  1 week   ; you may begin driving on  00/86/76 .  WOUND CARE - Keep the wound area clean and dry.  Do not get this area wet for one week. No showers for one week; you may shower on  01/22/18   . - The tape/steri-strips on your wound will fall off; do not pull them off.  No bandage is needed on the site.  DO  NOT apply any creams, oils, or ointments to the wound area. - If you notice any drainage or discharge from the wound, any swelling or bruising at the site, or you develop a fever > 101? F after you are discharged home, call the office at once.  Special Instructions - You are still able to use cellular telephones; use the ear opposite the side where you have your pacemaker/defibrillator.  Avoid carrying your cellular phone near your device. - When traveling through airports, show security personnel your identification card to avoid being screened in the metal detectors.  Ask the security personnel to use the hand wand. - Avoid arc welding equipment, MRI testing (magnetic resonance imaging), TENS units (transcutaneous nerve stimulators).  Call the office for questions about other devices. - Avoid electrical appliances that are in poor condition or are not properly grounded. - Microwave ovens are safe to be near or to operate.

## 2018-01-15 NOTE — Interval H&P Note (Signed)
History and Physical Interval Note:  01/15/2018 2:16 PM  Ricky Barnett  has presented today for surgery, with the diagnosis of HEART FAILURE  The various methods of treatment have been discussed with the patient and family. After consideration of risks, benefits and other options for treatment, the patient has consented to  Procedure(s): PACEMAKER LEAD REMOVAL,REMOVE ONE LEAD,OCCLUDED ACCESS,PUT IN BIVENTRICULAR PACEMAKER (N/A) as a surgical intervention .  The patient's history has been reviewed, patient examined, no change in status, stable for surgery.  I have reviewed the patient's chart and labs.  Questions were answered to the patient's satisfaction.     [name]

## 2018-01-15 NOTE — Anesthesia Postprocedure Evaluation (Signed)
Anesthesia Post Note  Patient: Ricky Barnett  Procedure(s) Performed: PACEMAKER LEAD REMOVAL,REMOVE ONE LEAD,OCCLUDED ACCESS,PUT IN BIVENTRICULAR PACEMAKER (N/A Chest)     Patient location during evaluation: PACU Anesthesia Type: General Level of consciousness: sedated and patient cooperative Pain management: pain level controlled Vital Signs Assessment: post-procedure vital signs reviewed and stable Respiratory status: spontaneous breathing Cardiovascular status: stable Anesthetic complications: no    Last Vitals:  Vitals:   01/15/18 2015 01/15/18 2029  BP: 122/74 (!) 108/92  Pulse: 82 78  Resp: 20   Temp:  36.7 C  SpO2: 96% 98%    Last Pain:  Vitals:   01/15/18 2000  TempSrc:   PainSc: 0-No pain                 Nolon Nations

## 2018-01-15 NOTE — Anesthesia Procedure Notes (Signed)
Procedure Name: Intubation Date/Time: 01/15/2018 2:53 PM Performed by: Inda Coke, CRNA Pre-anesthesia Checklist: Patient identified, Emergency Drugs available, Suction available and Patient being monitored Patient Re-evaluated:Patient Re-evaluated prior to induction Oxygen Delivery Method: Circle System Utilized Preoxygenation: Pre-oxygenation with 100% oxygen Induction Type: IV induction Ventilation: Mask ventilation without difficulty and Oral airway inserted - appropriate to patient size Laryngoscope Size: Mac and 4 Grade View: Grade I Tube type: Oral Tube size: 7.5 mm Number of attempts: 1 Airway Equipment and Method: Stylet and Oral airway Placement Confirmation: ETT inserted through vocal cords under direct vision,  positive ETCO2 and breath sounds checked- equal and bilateral Secured at: 22 cm Tube secured with: Tape Dental Injury: Teeth and Oropharynx as per pre-operative assessment

## 2018-01-15 NOTE — Progress Notes (Signed)
Okay to remove right radial arterial line per Dr. Lissa Hoard.  Removed, catheter intact, pressure held for 10 minutes.  No bleeding.  Pressure dressing applied.  Will continue to monitor.

## 2018-01-15 NOTE — Transfer of Care (Signed)
Immediate Anesthesia Transfer of Care Note  Patient: Ricky Barnett  Procedure(s) Performed: PACEMAKER LEAD REMOVAL,REMOVE ONE LEAD,OCCLUDED ACCESS,PUT IN BIVENTRICULAR PACEMAKER (N/A Chest)  Patient Location: PACU  Anesthesia Type:General  Level of Consciousness: awake, pateint uncooperative and confused  Airway & Oxygen Therapy: Patient Spontanous Breathing and Patient connected to nasal cannula oxygen  Post-op Assessment: Report given to RN and Post -op Vital signs reviewed and stable  Post vital signs: Reviewed and stable  Last Vitals:  Vitals Value Taken Time  BP    Temp    Pulse 68 01/15/2018  7:10 PM  Resp 16 01/15/2018  7:11 PM  SpO2 95 % 01/15/2018  7:10 PM  Vitals shown include unvalidated device data.  Last Pain:  Vitals:   01/15/18 1314  TempSrc:   PainSc: 0-No pain         Complications: No apparent anesthesia complications

## 2018-01-15 NOTE — Op Note (Signed)
EP procedure note  Procedure performed: Extraction of an RV pacing lead, insertion of a new RV pacing lead, and insertion of a new left ventricular pacing lead in a patient with high-grade heart block, pacing induced congestive heart failure, EF 30% with an occluded left subclavian vein  Preoperative diagnosis: Chronic systolic heart failure, complete heart block, pacing induced LV dysfunction with pacing induced left bundle branch block and an occluded left subclavian vein  Postoperative diagnosis: Same as preoperative diagnosis  Description of the procedure: After informed consent was obtained, the patient was taken to the operating room in the fasting state.  The anesthesia service was utilized to provide invasive hemodynamic monitoring and general endotracheal anesthesia.  After the usual preparation draping and appropriate timeouts carried out, the right femoral vein was punctured and a 6 French sheath placed for central access.  Attention was then turned to the left chest.  A 5 cm incision was carried out and electrocautery utilized to dissect down to the pacemaker generator.  It was removed with gentle traction.  The leads were freed up from there are dense fibrous scar tissue with electrocautery.  The ventricular lead was targeted for removal to obtain access to the central circulation.  The patient had an underlying rhythm in the 40 bpm range.  First a stylette was inserted into the body of the lead and the helix retracted.  Next the lead was cut and a [employer] locking stylette inserted into the body of the lead and locked in place.  The proximal portion of the lead was secured with a silk suture.  The [employer] 45 [place] short extraction sheath was then advanced over the body of the lead down to the innominate vein.  The lead remained in place.  The [employer] 39 [place] long extraction sheath was then advanced over the lead and the lead was removed in total with no hemodynamic  consequences.  Access to the central circulation was maintained.  A [place] wire was advanced into the sheath and the sheath was removed in total.  There is no hemodynamic sequelae.  At this point attention was turned to placement of the new RV and new LV pacing leads.  The 9 French long sheath was inserted into the central circulation with some difficulty.  The [facility] [employer] dual-chamber active-fixation RV pacing lead, serial [id number] and was advanced under fluoroscopic guidance into the right ventricle.  R waves measured 12.  The pacing impedance was 700 ohms.  The pacing threshold was less than a volt at 0.5 ms.  With the satisfactory parameters attention was then turned to placement of the left ventricular lead.  The guiding catheter was inserted over the guidewire and advanced along with a 6 French hexapolar EP catheter into the coronary sinus.  Coronary sinus cannulation and advancement of the lead was made more difficult by valve sitting at approximately 5:00 in the LAO projection.  The valve was traversed and venography of the coronary sinus was obtained.  This demonstrated a posterior vein as well as a lateral vein.  A guidewire was inserted into the lateral vein but the lead could not be advanced over the guidewire secondary to the valve in the coronary sinus and the extreme tortuosity of the coronary sinus.  Attention was then turned to the posterior vein and with a moderate amount of difficulty the [facility] [employer] acuity spiral quadripolar LV lead, serial [id number] was advanced to the distal portion of the vein and the guiding catheter was removed in the  usual manner.  At this point attention was turned to hemostasis in the pocket.  There was severe bleeding which was present for quite some time during the procedure.  Multiple silk sutures were utilized to slow the bleeding but it could not be taken care of completely.  After approximately 30 minutes of trying to obtain hemostasis, the patient  remained bleeding and I asked cardiac surgery to come and help me with repair of the torn vein.  Dr. Servando Snare was quite successful in sewing together the vein and obtaining hemostasis.  His procedure note will be dictated separately.  The leads were then secured to the fascia with silk suture and the sewing sleeve was secured with silk suture.  Electrocautery was utilized to make sure there is no additional bleeders and the pocket was irrigated with copious amounts of antibiotic irrigation.  The new [employer] biventricular pacemaker, serial [id number] was connected to the old right atrial lead, the new RV lead and the new LV lead in the usual manner.  The pocket was irrigated with antibiotic irrigation and the incision was closed with 2 layers of Vicryl suture.  The patient 6 French sheath was removed from the right femoral vein and he was returned to the recovery area in stable condition.  Complications: There were no immediate procedure complications with the exception of the torn left subclavian vein which was repaired as noted.  Conclusion: Successful extraction of a old RV pacing lead with successful insertion of a new RV lead and a new LV lead in a patient with pacing induced congestive heart failure and high-grade heart block.  [name], MD

## 2018-01-15 NOTE — Progress Notes (Signed)
Lindsi forte, RN notified [employer] of pt's surgery time. Stated Ricky Barnett, rep. Is on his way.

## 2018-01-15 NOTE — Progress Notes (Signed)
  Echocardiogram Echocardiogram Transesophageal has been performed.  Ricky Barnett 01/15/2018, 3:20 PM

## 2018-01-15 NOTE — Anesthesia Procedure Notes (Signed)
Arterial Line Insertion Start/End11/15/2019 1:15 PM, 01/15/2018 1:20 PM Performed by: CRNA  Patient location: Pre-op. Preanesthetic checklist: patient identified, IV checked, site marked, risks and benefits discussed, surgical consent, monitors and equipment checked, pre-op evaluation, timeout performed and anesthesia consent Right, ulnar was placed Catheter size: 20 G Hand hygiene performed  and maximum sterile barriers used  Allen's test indicative of satisfactory collateral circulation Attempts: 1 Procedure performed without using ultrasound guided technique. Ultrasound Notes:anatomy identified Following insertion, dressing applied and Biopatch. Post procedure assessment: normal  Patient tolerated the procedure well with no immediate complications.

## 2018-01-16 ENCOUNTER — Inpatient Hospital Stay (HOSPITAL_COMMUNITY): Payer: Medicare HMO

## 2018-01-16 ENCOUNTER — Encounter (HOSPITAL_COMMUNITY): Payer: Self-pay

## 2018-01-16 ENCOUNTER — Other Ambulatory Visit: Payer: Self-pay

## 2018-01-16 DIAGNOSIS — I442 Atrioventricular block, complete: Secondary | ICD-10-CM

## 2018-01-16 LAB — CBC
HEMATOCRIT: 35 % — AB (ref 39.0–52.0)
Hemoglobin: 11.3 g/dL — ABNORMAL LOW (ref 13.0–17.0)
MCH: 26.5 pg (ref 26.0–34.0)
MCHC: 32.3 g/dL (ref 30.0–36.0)
MCV: 82 fL (ref 80.0–100.0)
Platelets: 189 10*3/uL (ref 150–400)
RBC: 4.27 MIL/uL (ref 4.22–5.81)
RDW: 16 % — AB (ref 11.5–15.5)
WBC: 11.4 10*3/uL — AB (ref 4.0–10.5)
nRBC: 0 % (ref 0.0–0.2)

## 2018-01-16 LAB — ECHO INTRAOPERATIVE TEE
Height: 71 in
Weight: 3984 oz

## 2018-01-16 MED ORDER — INFLUENZA VAC SPLIT HIGH-DOSE 0.5 ML IM SUSY
0.5000 mL | PREFILLED_SYRINGE | INTRAMUSCULAR | Status: AC
  Administered 2018-01-18: 0.5 mL via INTRAMUSCULAR
  Filled 2018-01-16: qty 0.5

## 2018-01-16 NOTE — Progress Notes (Signed)
Sitter arrives at bedside for safety. Pt with tele off at this time, will resume tele now that staff will be permanent at bedside.   Pt's MD also turns corner to room to speak to family; given sensitivity of family concerns space allowed, will place tele if MD does not d/c pt.

## 2018-01-16 NOTE — Progress Notes (Signed)
Dr Caryl Comes notified of pt noncompliance with telemetry (indicated understanding ok to DC) and need for IV. IV abx cannot be administered due to lack of IV access.   CCMD notified; order d/c'd

## 2018-01-16 NOTE — Progress Notes (Signed)
Patient resting comfortably during shift report. Denies complaints.  

## 2018-01-16 NOTE — Progress Notes (Signed)
Progress Note  Patient Name: Ricky Barnett Date of Encounter: 01/16/2018  Primary Cardiologist: MCr   Primary Electrophysiologist: GT   Patient Profile     77 y.o. male 11/15 for pacemaker lead extraction and reimplantation of the RV lead and insertion of an LV lead for pacemaker induced left ventricular dysfunction.  (EF 20%) 2014 EF of 55-60%.  Interval Myoview has shown a large inferior wall infarction Extraction was complicated by bleeding requiring subclavian vein repair  Subjective   Denies chest pain or shortness of breath.  Inpatient Medications    Scheduled Meds: . sodium chloride   Intravenous Once  . sodium chloride   Intravenous Once  . furosemide  40 mg Oral Daily  . metoprolol succinate  50 mg Oral Daily  . multivitamin with minerals  1 tablet Oral Daily  . potassium chloride SA  20 mEq Oral Daily  . pravastatin  40 mg Oral Daily  . sacubitril-valsartan  1 tablet Oral BID  . tamsulosin  0.4 mg Oral QPC supper   Continuous Infusions: .  ceFAZolin (ANCEF) IV 1 g (01/16/18 0453)   PRN Meds: acetaminophen, ondansetron (ZOFRAN) IV   Vital Signs    Vitals:   01/15/18 2029 01/15/18 2338 01/16/18 0351 01/16/18 0829  BP: (!) 108/92 94/83 (!) 119/95 115/64  Pulse: 78 86 100 86  Resp:  18 18 18   Temp: 98 F (36.7 C) 97.8 F (36.6 C) 98.3 F (36.8 C) 98.5 F (36.9 C)  TempSrc:  Oral Oral Oral  SpO2: 98% 98% 96% 100%  Weight: 99.8 kg  98.6 kg   Height:        Intake/Output Summary (Last 24 hours) at 01/16/2018 1105 Last data filed at 01/16/2018 0725 Gross per 24 hour  Intake 3137.22 ml  Output 2560 ml  Net 577.22 ml   Filed Weights   01/15/18 1301 01/15/18 2029 01/16/18 0351  Weight: 112.9 kg 99.8 kg 98.6 kg    Telemetry    P synchronous pacing- Personally Reviewed  ECG    Sinus rhythm with P synchronous pacing with an upright QRS lead I and negative QRS lead V1 QRS duration 152 ms- Personally Reviewed  ECG reviewed for pacing induced  left bundle QRS duration 182 ms  ECG 10/25 demonstrated a narrow QRS with variable antegrade conduction  Physical Exam   GEN: No acute distress.   Neck: JVD \ Cardiac: RRR, no murmurs, rubs, or gallops Pocket without  hematoma, swelling or tenderness .  Respiratory: Clear to auscultation bilaterally. GI: Soft, nontender, non-distended  MS:   edema; No deformity. Neuro:  Nonfocal alert and oriented x1. Psych: Normal affect  Skin Warm and dry   Labs    Chemistry Recent Labs  Lab 01/15/18 1643 01/15/18 1806  NA 144 142  K 4.3 5.0     Hematology Recent Labs  Lab 01/15/18 1643 01/15/18 1806  HGB 11.6* 11.9*  HCT 34.0* 35.0*    Cardiac EnzymesNo results for input(s): TROPONINI in the last 168 hours. No results for input(s): TROPIPOC in the last 168 hours.   BNPNo results for input(s): BNP, PROBNP in the last 168 hours.   DDimer No results for input(s): DDIMER in the last 168 hours.   Radiology    Dg Chest 2 View  Result Date: 01/16/2018 CLINICAL DATA:  Pacemaker revision.  Confusion. EXAM: CHEST - 2 VIEW COMPARISON:  12/08/2017 and 12/04/2017 radiographs. FINDINGS: The lateral view is limited by suboptimal inspiration and the inability of the patient  to raise his left arm. The left subclavian pacemaker generator has been revised in the interval. There is a new ventricular lead. There is stable cardiomegaly and mild aortic tortuosity. There is mildly increased atelectasis at both lung bases and vascular congestion, largely due to lower lung volumes. No confluent airspace opacity, pneumothorax or significant pleural effusion. IMPRESSION: Interval revision of left subclavian AV sequential pacemaker with mild bibasilar atelectasis and vascular congestion. No pneumothorax. Electronically Signed   By: Richardean Sale M.D.   On: 01/16/2018 08:05       Device Interrogation    normal device function    Assessment & Plan    High-grade heart block  Pacemaker induced left  bundle branch block  Cardiomyopathy question pacemaker mediated versus interval myocardial infarction  Extraction and upgrade complicated by subclavian venous bleeding  Confusion We will check CBC  Not sure about his baseline mental status.  He has torn out his Foley catheter.  He has removed himself from telemetry repeatedly.  I will be concerned about him going home although this may be related to being in the hospital.  We will have to have this discussion with his wife.   Signed, Virl Axe, MD  01/16/2018, 11:05 AM

## 2018-01-16 NOTE — Progress Notes (Signed)
Pt was very agitated and moving his arm consistently. A sling with the an abdominal band was ordered for the patient to help him keep his left arm in place. The ortho tech helped to apply the sling. Will continue to monitor pt.

## 2018-01-16 NOTE — Plan of Care (Signed)
  Problem: Nutrition: Goal: Adequate nutrition will be maintained Outcome: Progressing   Problem: Coping: Goal: Level of anxiety will decrease Outcome: Progressing   Problem: Pain Managment: Goal: General experience of comfort will improve Outcome: Progressing   

## 2018-01-16 NOTE — Discharge Summary (Addendum)
Discharge Summary    Patient ID: Ricky Barnett MRN: [id number]; DOB: 1940-11-03  Admit date: 01/15/2018 Discharge date: 01/19/2018  Primary Care Provider: Merrilee Seashore, MD  Primary Cardiologist: Sanda Klein, MD  Primary Electrophysiologist:  [name], MD   Discharge Diagnoses    Active Problems:   Chronic systolic heart failure ([place])   Allergies No Known Allergies  Diagnostic Studies/Procedures   Procedure performed: Extraction of an RV pacing lead, insertion of a new RV pacing lead, and insertion of a new left ventricular pacing lead in a patient with high-grade heart block, pacing induced congestive heart failure, EF 30% with an occluded left subclavian vein. See op note for full details.    History of Present Illness/Hospital Course    Pt is a 77 y/o male with a h/o CHB s/p PPM who as developed PM induced LV dysfunction (EF now 20%). He then under went attempted insertion of a new LV lead but was found to have an occluded left subclavian vein. He was brought back to [facility] on 01/15[facility]18 for extraction/insertion of a new lead to epicardial lead placement. Procedure was successful, however extraction was complicated by bleeding requiring subclavian vein repair. He otherwise tolerated the procedure well and was transferred to telemetry unit for overnight observation. His hgb remained stable at 11.3. He did however develop confusion and internal medicine was consulted to assist w/ work-up.   He was seen by PT as well as OT and SNF was recommended. His mental status improved and he was considered to be stable for discharge to SNF by Dr Lovena Le on 01/19/18  _____________  Discharge Vitals Blood pressure 120/65, pulse 91, temperature 98.6 F (37 C), temperature source Oral, resp. rate 20, height 5\' 11"  (1.803 m), weight 99.3 kg, SpO2 100 %.  Filed Weights   01/17/18 0315 01/18/18 0611 01/19/18 0551  Weight: 99.8 kg 100.2 kg 99.3 kg    Labs & Radiologic Studies     CBC Recent Labs    01/16/18 1126  WBC 11.4*  HGB 11.3*  HCT 35.0*  MCV 82.0  PLT 189   _____________  Dg Chest 2 View  Result Date: 01/16/2018 CLINICAL DATA:  Pacemaker revision.  Confusion. EXAM: CHEST - 2 VIEW COMPARISON:  12/08/2017 and 12/04/2017 radiographs. FINDINGS: The lateral view is limited by suboptimal inspiration and the inability of the patient to raise his left arm. The left subclavian pacemaker generator has been revised in the interval. There is a new ventricular lead. There is stable cardiomegaly and mild aortic tortuosity. There is mildly increased atelectasis at both lung bases and vascular congestion, largely due to lower lung volumes. No confluent airspace opacity, pneumothorax or significant pleural effusion. IMPRESSION: Interval revision of left subclavian AV sequential pacemaker with mild bibasilar atelectasis and vascular congestion. No pneumothorax. Electronically Signed   By: Richardean Sale M.D.   On: 01/16/2018 08:05   Disposition   Pt is being discharged to SNF today in good condition.  Follow-up Plans & Appointments     Contact information for follow-up providers    [facility] Follow up.   Specialty:  Cardiology Why:  01/27/18 @ 11:00AM, wound check visit Contact information: [address], [address] [place] [place]       Evans Lance, MD Follow up.   Specialty:  Cardiology Why:  04/21/18 @ 11:30AM Contact information: [address] [place] [address] [phone]  Contact information for after-discharge care    Destination    [facility] Preferred SNF .   Service:  Skilled Nursing Contact information: [address] [place] [phone]                 Discharge Instructions    Diet - low sodium heart healthy   Complete by:  As directed    Increase activity slowly   Complete by:  As  directed       Discharge Medications   Allergies as of 01/19/2018   No Known Allergies     Medication List    TAKE these medications   acetaminophen 325 MG tablet Commonly known as:  TYLENOL Take 1-2 tablets (325-650 mg total) by mouth every 4 (four) hours as needed. What changed:  reasons to take this   apixaban 5 MG Tabs tablet Commonly known as:  ELIQUIS Take 1 tablet (5 mg total) by mouth 2 (two) times daily.   furosemide 40 MG tablet Commonly known as:  LASIX Take 1 tablet (40 mg total) by mouth daily.   metFORMIN 750 MG 24 hr tablet Commonly known as:  GLUCOPHAGE-XR Take 1,500 mg by mouth every evening.   metoprolol succinate 50 MG 24 hr tablet Commonly known as:  TOPROL-XL Take 50 mg by mouth daily. Take with or immediately following a meal.   multivitamin tablet Take 1 tablet by mouth daily.   potassium chloride SA 20 MEQ tablet Commonly known as:  K-DUR,KLOR-CON Take 1 tablet (20 mEq total) by mouth daily.   pravastatin 40 MG tablet Commonly known as:  PRAVACHOL Take 1 tablet (40 mg total) by mouth daily.   sacubitril-valsartan 97-103 MG Commonly known as:  ENTRESTO Take 1 tablet by mouth 2 (two) times daily.   tamsulosin 0.4 MG Caps capsule Commonly known as:  FLOMAX Take 0.4 mg by mouth daily after supper.        Acute coronary syndrome (MI, NSTEMI, STEMI, etc) this admission?: No.    Outstanding Labs/Studies   none  Duration of Discharge Encounter   Greater than 30 minutes including physician time.  Signed, Chanetta Marshall, NP 01/19/2018, 11:19 AM  EP Attending  Patient seen and examined. Agree with the findings as noted by Chanetta Marshall, NP. The patient is stable for DC to a SNF. Usual followup.   Mikle Bosworth.D.

## 2018-01-16 NOTE — Progress Notes (Signed)
Pt disassembled foley tubing  Foley removed by RN  Pt continious moving left arm  Pt attempting to get out of bed and removed mittens  Paged PA for sitter order  RN at bedside

## 2018-01-16 NOTE — Progress Notes (Signed)
Multiple attempts to place an IV by IV team, with [place]. Pt still needs today's dose of cefazolin.

## 2018-01-16 NOTE — Progress Notes (Signed)
Patient's brother-in-law expresses concerns for his sister's safety. Per BIL pt is being nice now, but at home he has a tendency to get aggressive with her (his wife) and this is concerning for her. Pt's wife requests he spend another night in the hospital on account of her fearing he will be too much to handle. She is agreeable to the hazard it might develop into.  Wife and Brother-In-Law advised to consider the options in terms of placement in facility- if she is legitimately concerned about her safety.   Family and patient present for conversation with Dr Caryl Comes.   Per Dr Caryl Comes, he will get medicine on board to review any possible metabolic concerns that might be contributing to pt confusion; or whether or not its advanced dementia causing the issues.   Pt will not discharge today.

## 2018-01-16 NOTE — Progress Notes (Signed)
Wife at bedside.  Page to Cards PA to notify MD.

## 2018-01-17 NOTE — Progress Notes (Signed)
Progress Note  Patient Name: Ricky Barnett Date of Encounter: 01/17/2018  Primary Cardiologist: MCr   Primary Electrophysiologist: GT   Patient Profile     77 y.o. male 11/15 for pacemaker lead extraction and reimplantation of the RV lead and insertion of an LV lead for pacemaker induced left ventricular dysfunction.  (EF 20%) 2014 EF of 55-60%.  Interval Myoview has shown a large inferior wall infarction Extraction was complicated by bleeding requiring subclavian vein repair  Subjective   No chest pain or shortness of breath.  Confabulating.  Inpatient Medications    Scheduled Meds: . sodium chloride   Intravenous Once  . furosemide  40 mg Oral Daily  . Influenza vac split quadrivalent PF  0.5 mL Intramuscular Tomorrow-1000  . metoprolol succinate  50 mg Oral Daily  . multivitamin with minerals  1 tablet Oral Daily  . potassium chloride SA  20 mEq Oral Daily  . pravastatin  40 mg Oral Daily  . sacubitril-valsartan  1 tablet Oral BID  . tamsulosin  0.4 mg Oral QPC supper   Continuous Infusions:  PRN Meds: acetaminophen, ondansetron (ZOFRAN) IV   Vital Signs    Vitals:   01/16/18 1938 01/16/18 2114 01/17/18 0315 01/17/18 1100  BP: 107/69 108/67 100/65 109/77  Pulse: 93 92 87 90  Resp: 18  18 18   Temp: 98.8 F (37.1 C)  97.7 F (36.5 C) 97.6 F (36.4 C)  TempSrc: Oral  Oral Oral  SpO2: 100%  98% 98%  Weight:   99.8 kg   Height:        Intake/Output Summary (Last 24 hours) at 01/17/2018 1224 Last data filed at 01/17/2018 1140 Gross per 24 hour  Intake 600.39 ml  Output 1250 ml  Net -649.61 ml   Filed Weights   01/15/18 2029 01/16/18 0351 01/17/18 0315  Weight: 99.8 kg 98.6 kg 99.8 kg    Telemetry    P-synchronous/ AV  pacing   ECG    Sinus rhythm with P synchronous pacing with an upright QRS lead I and negative QRS lead V1 QRS duration 152 ms-    ECG reviewed for pacing induced left bundle QRS duration 182 ms  ECG 10/25 demonstrated a narrow  QRS with variable antegrade conduction  Physical Exam   Well developed and nourished  RRR NO edema Alert and o X 2  Labs    Chemistry Recent Labs  Lab 01/15/18 1643 01/15/18 1806  NA 144 142  K 4.3 5.0     Hematology Recent Labs  Lab 01/15/18 1643 01/15/18 1806 01/16/18 1126  WBC  --   --  11.4*  RBC  --   --  4.27  HGB 11.6* 11.9* 11.3*  HCT 34.0* 35.0* 35.0*  MCV  --   --  82.0  MCH  --   --  26.5  MCHC  --   --  32.3  RDW  --   --  16.0*  PLT  --   --  189    Cardiac EnzymesNo results for input(s): TROPONINI in the last 168 hours. No results for input(s): TROPIPOC in the last 168 hours.   BNPNo results for input(s): BNP, PROBNP in the last 168 hours.   DDimer No results for input(s): DDIMER in the last 168 hours.   Radiology    Dg Chest 2 View  Result Date: 01/16/2018 CLINICAL DATA:  Pacemaker revision.  Confusion. EXAM: CHEST - 2 VIEW COMPARISON:  12/08/2017 and 12/04/2017 radiographs. FINDINGS: The  lateral view is limited by suboptimal inspiration and the inability of the patient to raise his left arm. The left subclavian pacemaker generator has been revised in the interval. There is a new ventricular lead. There is stable cardiomegaly and mild aortic tortuosity. There is mildly increased atelectasis at both lung bases and vascular congestion, largely due to lower lung volumes. No confluent airspace opacity, pneumothorax or significant pleural effusion. IMPRESSION: Interval revision of left subclavian AV sequential pacemaker with mild bibasilar atelectasis and vascular congestion. No pneumothorax. Electronically Signed   By: Richardean Sale M.D.   On: 01/16/2018 08:05       Device Interrogation    normal device function    Assessment & Plan    High-grade heart block  Pacemaker induced left bundle branch block  Cardiomyopathy question pacemaker mediated versus interval myocardial infarction  Extraction and upgrade complicated by subclavian venous  bleeding  Confusion  Social services has been consulted.  Need to make arrangements for next phase of care.  Ambulate.  Signed, Virl Axe, MD  01/17/2018, 12:24 PM

## 2018-01-17 NOTE — Progress Notes (Signed)
Patient resting comfortably during shift report. Denies complaints. Patient did however remove his IV access again last night, discovered in report.

## 2018-01-17 NOTE — Progress Notes (Signed)
Contact made with CSW after multiple calls/locating correct number.   Requested Tara,CSW have conversation with family at bedside to discuss placement/careplan for after d/c

## 2018-01-17 NOTE — Plan of Care (Signed)
  Problem: Clinical Measurements: Goal: Ability to maintain clinical measurements within normal limits will improve Outcome: Progressing Goal: Respiratory complications will improve Outcome: Progressing Goal: Cardiovascular complication will be avoided Outcome: Progressing   Problem: Activity: Goal: Risk for activity intolerance will decrease Outcome: Progressing   

## 2018-01-17 NOTE — Progress Notes (Signed)
Called social work, Dance movement psychotherapist is full  Will contact again shortly Primary RN aware

## 2018-01-18 DIAGNOSIS — I5022 Chronic systolic (congestive) heart failure: Secondary | ICD-10-CM

## 2018-01-18 LAB — BPAM RBC
BLOOD PRODUCT EXPIRATION DATE: 201912132359
Blood Product Expiration Date: 201912132359
Blood Product Expiration Date: 201912132359
Blood Product Expiration Date: 201912132359
Blood Product Expiration Date: 201912132359
Blood Product Expiration Date: 201912132359
ISSUE DATE / TIME: 201911180054
ISSUE DATE / TIME: 201911151448
ISSUE DATE / TIME: 201911151448
UNIT TYPE AND RH: 5100
UNIT TYPE AND RH: 5100
Unit Type and Rh: 5100
Unit Type and Rh: 5100
Unit Type and Rh: 5100
Unit Type and Rh: 5100

## 2018-01-18 LAB — TYPE AND SCREEN
ABO/RH(D): O POS
ANTIBODY SCREEN: NEGATIVE
UNIT DIVISION: 0
UNIT DIVISION: 0
Unit division: 0
Unit division: 0
Unit division: 0
Unit division: 0

## 2018-01-18 NOTE — Clinical Social Work Note (Signed)
Clinical Social Work Assessment  Patient Details  Name: Ricky Barnett MRN: [id number] Date of Birth: [birth year]  Date of referral:  01/18/18               Reason for consult:  Facility Placement, Discharge Planning                Permission sought to share information with:  Facility Sport and exercise psychologist, Family Supports Permission granted to share information::  Yes, Verbal Permission Granted  Name::     Ricky Barnett  Agency::  SNF's  Relationship::  Wife  Contact Information:  [phone]  Housing/Transportation Living arrangements for the past 2 months:  [place] of Information:  Patient, Medical Team, Spouse Patient Interpreter Needed:  None Criminal Activity/Legal Involvement Pertinent to Current Situation/Hospitalization:  No - Comment as needed Significant Relationships:  Spouse, Adult Children, Other Family Members Lives with:  Spouse Do you feel safe going back to the place where you live?  Yes Need for family participation in patient care:  Yes (Comment)  Care giving concerns:  PT recommending SNF once medically stable for discharge.   Social Worker assessment / plan:  CSW met with patient. Wife and sitter at bedside. Per RN, patient has no negative behaviors. He only has a Actuary because he kept wanting someone in the room with him. CSW introduced role and explained that PT recommendations would be discussed. Patient and his wife agreeable to SNF placement. Initially they stated the did not want [facility] but wife later called CSW and stated this was first preference because she uses the bus system. CSW will send referral and follow up with their hospital liaison. No further concerns. CSW encouraged patient and his wife to contact CSW as needed. CSW will continue to follow patient and his wife for support and facilitate discharge to SNF once medically stable.   Employment status:  Retired Nurse, adult PT  Recommendations:  [place] / Referral to community resources:  [place]  Patient/Family's Response to care:  Patient and his wife agreeable to SNF placement. Patient's wife supportive and involved in patient's care. Patient and his wife appreciated social work intervention.  Patient/Family's Understanding of and Emotional Response to Diagnosis, Current Treatment, and Prognosis:  Patient and his wife have a good understanding of the reason for admission and his need for rehab prior to returning home. Patient and his wife appear happy with hospital care.  Emotional Assessment Appearance:  Appears stated age Attitude/Demeanor/Rapport:  Engaged, Gracious Affect (typically observed):  Accepting, Appropriate, Calm, Pleasant Orientation:  Oriented to Self, Oriented to Place, Oriented to  Time, Oriented to Situation Alcohol / Substance use:  Never Used Psych involvement (Current and /or in the community):  No (Comment)  Discharge Needs  Concerns to be addressed:  Care Coordination Readmission within the last 30 days:  No Current discharge risk:  Dependent with Mobility Barriers to Discharge:  Continued Medical Work up, [place], LCSW 01/18/2018, 2:16 PM

## 2018-01-18 NOTE — Clinical Social Work Note (Addendum)
CSW acknowledges consult "Needs support once discharged, dementia, difficult for wife to care for." Please place PT and OT orders when appropriate to determine if patient is in need of SNF vs. HHPT. Will follow progress.  Day[place] Scrape, CSW [phone]  1:16 pm PT recommending SNF. Patient not fully oriented and wife not at bedside. Tried calling wife but it said she was not accepting incoming calls at this time.  Dayton Scrape, [place]

## 2018-01-18 NOTE — Progress Notes (Signed)
Pt wife at bedside, pt up in chair after working with therapy, pt calm and cooperative, pt says he did not want flu shot as he had so many, I educated on it and wife also told him he needs it. Pt agreeable, sitter remains at bedside as pt reportedly becomes intermittently agitaed and concerns for safety

## 2018-01-18 NOTE — Progress Notes (Addendum)
   Electrophysiology Rounding Note  Patient Name: Ricky Barnett Date of Encounter: 01/18/2018  Primary Cardiologist: [place] Electrophysiologist: Lovena Le   Subjective   The patient is doing ok today.  At this time, the patient denies chest pain, shortness of breath, or any new concerns. PT recommending SNF - sitter needs to be DC'd for 24 hours prior to admitting to SNF.   Inpatient Medications    Scheduled Meds: . sodium chloride   Intravenous Once  . furosemide  40 mg Oral Daily  . metoprolol succinate  50 mg Oral Daily  . multivitamin with minerals  1 tablet Oral Daily  . potassium chloride SA  20 mEq Oral Daily  . pravastatin  40 mg Oral Daily  . sacubitril-valsartan  1 tablet Oral BID  . tamsulosin  0.4 mg Oral QPC supper   Continuous Infusions:  PRN Meds: acetaminophen, ondansetron (ZOFRAN) IV   Vital Signs    Vitals:   01/17/18 1950 01/18/18 0611 01/18/18 1103 01/18/18 1317  BP: (!) 108/59 133/74 112/70 114/71  Pulse: 95 (!) 101 88 83  Resp: 20 20 16 17   Temp: 98.9 F (37.2 C) 98.3 F (36.8 C) 98.8 F (37.1 C) 98.7 F (37.1 C)  TempSrc: Oral Oral Oral Oral  SpO2: 100% 100% 100% 100%  Weight:  100.2 kg    Height:        Intake/Output Summary (Last 24 hours) at 01/18/2018 1440 Last data filed at 01/18/2018 1405 Gross per 24 hour  Intake 1257 ml  Output 1300 ml  Net -43 ml   Filed Weights   01/16/18 0351 01/17/18 0315 01/18/18 0611  Weight: 98.6 kg 99.8 kg 100.2 kg    Physical Exam    GEN- The patient is elderly appearing, alert today.   Head- normocephalic, atraumatic Eyes-  Sclera clear, conjunctiva pink Ears- hearing intact Oropharynx- clear Neck- supple Lungs- Clear to ausculation bilaterally, normal work of breathing Heart- Regular rate and rhythm  GI- soft, NT, ND, + BS Extremities- no clubbing, cyanosis, or edema Skin- no rash or lesion Psych- euthymic mood, full affect Neuro- strength and sensation are intact  Labs     CBC Recent Labs    01/15/18 1806 01/16/18 1126  WBC  --  11.4*  HGB 11.9* 11.3*  HCT 35.0* 35.0*  MCV  --  82.0  PLT  --  545   Basic Metabolic Panel Recent Labs    01/15/18 1643 01/15/18 1806  NA 144 142  K 4.3 5.0     Telemetry    Sinus rhythm with V pacing (personally reviewed)  Radiology    No results found.    Assessment & Plan    1.  High grade heart block S/p extraction of previously implanted RV lead and upgrade of device complicated by subclavian venous bleeding Stable from device standpoint  2.  Confusion  Has been seen by PT and SNF recommended Will need to DC sitter for 24 hours before placement, sitter discontinued today  Medical issues are stable. Hopefully to SNF tomorrow  For questions or updates, please contact [place] Please consult [email or web address] for contact info under Cardiology/STEMI.  Signed, Chanetta Marshall, NP  01/18/2018, 2:40 PM   EP Attending  Patient seen and examined. Agree with above. The patient is much improved. His confusion on my visit is resolved. He will hopefully be transferred to SNF for rehab tomorrow.   Mikle Bosworth.D.

## 2018-01-18 NOTE — Evaluation (Signed)
Physical Therapy Evaluation Patient Details Name: Ricky Barnett MRN: [id number] DOB: 11-Sep-1940 Today's Date: 01/18/2018   History of Present Illness  Pt is a 77 y/o male with pacemaker induced LBBB and high grade heart block who is s/p pacemaker lead extraction and revision of lead placement that was complicated by need for subclavian repair as well. PMH includes HTN and DM.   Clinical Impression  Pt admitted secondary to problem above with deficits below. Pt very unaware of deficits and required continuous multimodal cues to maintain pacemaker precautions. Pt unsteady as well and requiring min to mod A to maintain balance throughout. Feel pt is at increased risk for falls and will require increased assist at d/c. Pt's wife reports she cannot provide physical assist for the pt. Will continue to follow acutely to maximize functional mobility independence and safety.     Follow Up Recommendations SNF;Supervision/Assistance - 24 hour    Equipment Recommendations  None recommended by PT    Recommendations for Other Services       Precautions / Restrictions Precautions Precautions: Fall;ICD/Pacemaker Precaution Comments: Reviewed pacemaker precautions with pt and family, however, pt required continuous multimodal cues to maintain them.  Restrictions Weight Bearing Restrictions: Yes LUE Weight Bearing: (pacemaker precautions )      Mobility  Bed Mobility Overal bed mobility: Needs Assistance Bed Mobility: Supine to Sit     Supine to sit: Mod assist     General bed mobility comments: Mod A for trunk elevation. Multimodal cues to avoid using LUE during bed mobility.   Transfers Overall transfer level: Needs assistance Equipment used: 1 person hand held assist Transfers: Sit to/from Stand Sit to Stand: Mod assist         General transfer comment: Mod A for lift assist and steadying. Required multimodal cues not to use LUE during transfer from sit to stand. Had to physically  hold LUE for pt to avoid using LUE.   Ambulation/Gait Ambulation/Gait assistance: Min assist;Mod assist Gait Distance (Feet): 30 Feet Assistive device: 1 person hand held assist Gait Pattern/deviations: Step-through pattern;Decreased stride length;Drifts right/left;Staggering left Gait velocity: Decreased    General Gait Details: Slow, unsteady gait. Min to mod A for steadying and pt seemed to have L lateral lean. Pt with a few LOB during short distance ambulation. Cues required to remind pt not to reach out for surfaces with LUE.   Stairs            Wheelchair Mobility    Modified Rankin (Stroke Patients Only)       Balance Overall balance assessment: Needs assistance Sitting-balance support: No upper extremity supported;Feet supported Sitting balance-Leahy Scale: Fair   Postural control: Left lateral lean Standing balance support: Single extremity supported;During functional activity Standing balance-Leahy Scale: Poor Standing balance comment: Reliant on UE and external support                              Pertinent Vitals/Pain Pain Assessment: No/denies pain    Home Living Family/patient expects to be discharged to:: Private residence Living Arrangements: Spouse/significant other Available Help at Discharge: Family;Available PRN/intermittently Type of Home: House Home Access: Stairs to enter Entrance Stairs-Rails: Right;Left;Can reach both Entrance Stairs-Number of Steps: 4 Home Layout: One level Home Equipment: Cane - single point      Prior Function Level of Independence: Independent with assistive device(s)         Comments: Wife reports pt occasionally uses cane.  Hand Dominance        Extremity/Trunk Assessment   Upper Extremity Assessment Upper Extremity Assessment: LUE deficits/detail LUE Deficits / Details: Limited ROM secondary to pacemaker precautions     Lower Extremity Assessment Lower Extremity Assessment:  Generalized weakness    Cervical / Trunk Assessment Cervical / Trunk Assessment: Kyphotic;Other exceptions Cervical / Trunk Exceptions: Left lateral lean   Communication   Communication: No difficulties  Cognition Arousal/Alertness: Awake/alert Behavior During Therapy: WFL for tasks assessed/performed Overall Cognitive Status: Impaired/Different from baseline Area of Impairment: Memory;Following commands;Safety/judgement;Awareness;Problem solving;Attention                   Current Attention Level: Sustained Memory: Decreased short-term memory;Decreased recall of precautions Following Commands: Follows one step commands with increased time Safety/Judgement: Decreased awareness of deficits;Decreased awareness of safety Awareness: Emergent Problem Solving: Slow processing;Requires tactile cues;Requires verbal cues General Comments: Pt very unaware of pacemaker precautions despite max education about importance of maintaining them. Reports "I'm going to use this thing alot" meaning his LUE. Required multmodal cues throughout for safety and maintaining precautions.       General Comments General comments (skin integrity, edema, etc.): Pt's wife present during session and pt's safety sitter present.     Exercises     Assessment/Plan    PT Assessment Patient needs continued PT services  PT Problem List Decreased strength;Decreased balance;Decreased mobility;Decreased knowledge of use of DME;Decreased cognition;Decreased safety awareness;Decreased knowledge of precautions       PT Treatment Interventions DME instruction;Stair training;Gait training;Functional mobility training;Therapeutic activities;Therapeutic exercise;Balance training;Cognitive remediation;Patient/family education    PT Goals (Current goals can be found in the Care Plan section)  Acute Rehab PT Goals Patient Stated Goal: For pt to be able to do for himself before going home per wife.  PT Goal Formulation:  With patient Time For Goal Achievement: 02/01/18 Potential to Achieve Goals: Fair    Frequency Min 2X/week   Barriers to discharge        Co-evaluation               AM-PAC PT "6 Clicks" Daily Activity  Outcome Measure Difficulty turning over in bed (including adjusting bedclothes, sheets and blankets)?: Unable Difficulty moving from lying on back to sitting on the side of the bed? : Unable Difficulty sitting down on and standing up from a chair with arms (e.g., wheelchair, bedside commode, etc,.)?: Unable Help needed moving to and from a bed to chair (including a wheelchair)?: A Little Help needed walking in hospital room?: A Lot Help needed climbing 3-5 steps with a railing? : A Lot 6 Click Score: 10    End of Session Equipment Utilized During Treatment: Gait belt Activity Tolerance: Patient tolerated treatment well Patient left: in chair;with call bell/phone within reach;with chair alarm set;with family/visitor present;with nursing/sitter in room Nurse Communication: Mobility status PT Visit Diagnosis: Unsteadiness on feet (R26.81);Muscle weakness (generalized) (M62.81)    Time: 8527-7824 PT Time Calculation (min) (ACUTE ONLY): 19 min   Charges:   PT Evaluation $PT Eval Moderate Complexity: [place], PT, DPT  Acute Rehabilitation Services  Pager: [phone] Office: [phone]   Rudean Hitt 01/18/2018, 12:50 PM

## 2018-01-18 NOTE — Progress Notes (Signed)
Sitter dc per order, wife at bedside and instructed to call if she leaves to put in bed with alarm

## 2018-01-18 NOTE — NC FL2 (Signed)
[place] LEVEL OF CARE SCREENING TOOL     IDENTIFICATION  Patient Name: Ricky Barnett Birthdate: 1940-03-15 Sex: male Admission Date (Current Location): 01/15/2018  [facility] and [place] Number:  Herbalist and Address:  [facility], [place] [address] [place], [place] [address]      Provider Number: [id number]  Attending Physician Name and Address:  Evans Lance, MD  Relative Name and Phone Number:       Current Level of Care: Hospital Recommended Level of Care: [place] Prior Approval Number:    Date Approved/Denied:   PASRR Number: [id number]  Discharge Plan: SNF    Current Diagnoses: Patient Active Problem List   Diagnosis Date Noted  . Acute on chronic combined systolic (congestive) and diastolic (congestive) heart failure ([place]) 12/08/2017  . Chronic systolic heart failure ([place]) 12/04/2017  . Paroxysmal atrial fibrillation ([place]) 10/31/2017  . Acute on chronic combined systolic and diastolic CHF (congestive heart failure) ([place]) 04/23/2017  . Acute cholecystitis 10/09/2016  . Colitis 06/12/2016  . Nonischemic cardiomyopathy ([place]) 10/14/2014  . Erectile dysfunction 01/09/2013  . Pacemaker 11/30/2012  . NSVT (nonsustained ventricular tachycardia) ([place]) 11/13/2012  . AV block, Mobitz 1, with HR to 39 10/25/2012  . AV junctional tachycardia ([place]) 10/25/2012  . Essential hypertension 10/18/2012  . Dyslipidemia 10/18/2012  . Chest pain 10/18/2012  . Non-insulin dependent type 2 diabetes mellitus ([place]) 10/18/2012  . First degree AV block 10/18/2012  . Palpitations 10/18/2012    Orientation RESPIRATION BLADDER Height & Weight     Self, Situation  Normal Incontinent Weight: 221 lb (100.2 kg) Height:  5\' 11"  (180.3 cm)  BEHAVIORAL SYMPTOMS/MOOD NEUROLOGICAL BOWEL NUTRITION STATUS  (None) (None) Continent Diet(Heart healthy)  AMBULATORY STATUS COMMUNICATION OF NEEDS Skin   Limited Assist Verbally Skin  abrasions, Surgical wounds                       Personal Care Assistance Level of Assistance  Bathing, Feeding, Dressing Bathing Assistance: Limited assistance Feeding assistance: Independent Dressing Assistance: Limited assistance     Functional Limitations Info  Sight, Hearing, Speech Sight Info: Adequate Hearing Info: Adequate Speech Info: Adequate    SPECIAL CARE FACTORS FREQUENCY  PT (By licensed PT), OT (By licensed OT)     PT Frequency: 5 x week OT Frequency: 5 x week            Contractures Contractures Info: Not present    Additional Factors Info  Code Status, Allergies Code Status Info: Full code Allergies Info: NKDA           Current Medications (01/18/2018):  This is the current hospital active medication list Current Facility-Administered Medications  Medication Dose Route Frequency Provider Last Rate Last Dose  . 0.9 %  sodium chloride infusion (Manually program via Guardrails IV Fluids)   Intravenous Once Evans Lance, MD      . acetaminophen (TYLENOL) tablet 325-650 mg  325-650 mg Oral Q4H PRN Evans Lance, MD   650 mg at 01/15/18 2333  . furosemide (LASIX) tablet 40 mg  40 mg Oral Daily Evans Lance, MD   40 mg at 01/18/18 1109  . metoprolol succinate (TOPROL-XL) 24 hr tablet 50 mg  50 mg Oral Daily Evans Lance, MD   50 mg at 01/18/18 1107  . multivitamin with minerals tablet 1 tablet  1 tablet Oral Daily Evans Lance, MD   1 tablet  at 01/18/18 1110  . ondansetron (ZOFRAN) injection 4 mg  4 mg Intravenous Q6H PRN Evans Lance, MD      . potassium chloride SA (K-DUR,KLOR-CON) CR tablet 20 mEq  20 mEq Oral Daily Evans Lance, MD   20 mEq at 01/18/18 1110  . pravastatin (PRAVACHOL) tablet 40 mg  40 mg Oral Daily Evans Lance, MD   40 mg at 01/18/18 1108  . sacubitril-valsartan (ENTRESTO) 97-103 mg per tablet  1 tablet Oral BID Evans Lance, MD   1 tablet at 01/18/18 1107  . tamsulosin (FLOMAX) capsule 0.4 mg  0.4 mg  Oral QPC supper Evans Lance, MD   0.4 mg at 01/17/18 2059     Discharge Medications: Please see discharge summary for a list of discharge medications.  Relevant Imaging Results:  Relevant Lab Results:   Additional Information SS#: [id number]. Has a sitter but RN said it was just because he always wanted someone in the room with him. Will have it discontinued at least 24 hours prior to discharge. Per RN, no behaviors.  Candie Chroman, LCSW

## 2018-01-18 NOTE — Op Note (Signed)
NAME: Ricky Barnett, Ricky Barnett MEDICAL RECORD [id number] ACCOUNT [id number] DATE OF BIRTH:[birth year] FACILITY: [facility] LOCATION: [facility] PHYSICIAN:Raianna Slight Maryruth Bun, MD  OPERATIVE REPORT  DATE OF PROCEDURE:  01/15/2018  PREOPERATIVE DIAGNOSIS:  Pacemaker revision with lead extraction and lead change.  POSTOPERATIVE DIAGNOSIS:  Bleeding from pacer insertion site.  PROCEDURE PERFORMED:  Exploration of left upper chest wound with control of bleeding with suture repair.  SURGEON:  Lanelle Bal, MD  BRIEF HISTORY:  The patient is a 77 year old male with previously placed dual-chamber pacemaker who has been having increasing symptoms of congestive heart failure and had been recommended by Dr. Lovena Le to have revision of his current pacemaker with  placement of a coronary sinus LV lead for biventricular pacing.  The patient's subclavian vein was noted to be occluded.  I was asked by Dr. Lovena Le to provide dedicated OR backup for extraction of the lead to gain access to replace with further leads.   This dedicated backup was provided from approximately 3:00-4:30 with the lead extracted.  Dr. Lovena Le proceeded with placement of additional leads but then encountered bleeding around the insertion site and asked for urgent intraoperative help.  This was  provided quickly.  DESCRIPTION OF PROCEDURE:  The previously placed pacemaker had already been removed.  The left infraclavicular pacer pocket and the insertion site were inspected.  There were areas of muscle that were bleeding, and these were coagulated.  As we moved the  pacing wires around it, it became apparent that bleeding was coming from the subclavian vein along the leads.  Several 4-0 Prolene sutures were placed in the soft tissue around the leads including the tunnel into the vein with several sutures placed,  and the bleeding stopped.  The subcutaneous pocket was revised slightly, and the previous capsule was removed.  With the operative field  hemostatic, the leads were then secured in place with sutures in the slide-on rubber bumpers.  Dr. Lovena Le then  completed the case with implantation of the new pacemaker including attachments and closure of the pocket.  LN/NUANCE  D:01/17/2018 T:01/18/2018 JOB:[id number]

## 2018-01-18 NOTE — Progress Notes (Signed)
On rounds, pt has sitter at bedside, pt agitated and says "I havent had nobody come check my surgery. I had this thing put in 4-5 days ago and youd think something want to make sure its not infected" I educated pt that MD makes daily rounds and maybe he forgot and that a doctor would be around this morning, pt agreeable, sitter remains at bedside

## 2018-01-18 NOTE — Clinical Social Work Placement (Signed)
   CLINICAL SOCIAL WORK PLACEMENT  NOTE  Date:  01/18/2018  Patient Details  Name: Ricky Barnett MRN: [id number] Date of Birth: 13-Mar-1940  Clinical Social Work is seeking post-discharge placement for this patient at the [place] level of care (*CSW will initial, date and re-position this form in  chart as items are completed):  Yes   Patient/family provided with [place] Work Department's list of facilities offering this level of care within the geographic area requested by the patient (or if unable, by the patient's family).  Yes   Patient/family informed of their freedom to choose among providers that offer the needed level of care, that participate in Medicare, Medicaid or managed care program needed by the patient, have an available bed and are willing to accept the patient.  Yes   Patient/family informed of [facility]'s ownership interest in [facility] and [facility], as well as of the fact that they are under no obligation to receive care at these facilities.  PASRR submitted to EDS on 01/18/18     PASRR number received on 01/18/18     Existing PASRR number confirmed on       FL2 transmitted to all facilities in geographic area requested by pt/family on 01/18/18     FL2 transmitted to all facilities within larger geographic area on       Patient informed that his/her managed care company has contracts with or will negotiate with certain facilities, including the following:            Patient/family informed of bed offers received.  Patient chooses bed at       Physician recommends and patient chooses bed at      Patient to be transferred to   on  .  Patient to be transferred to facility by       Patient family notified on   of transfer.  Name of family member notified:        PHYSICIAN Please sign FL2     Additional Comment:    _______________________________________________ Candie Chroman, LCSW 01/18/2018,  2:19 PM

## 2018-01-18 NOTE — Clinical Social Work Note (Signed)
NP will discontinue sitter order. [place] will start insurance authorization. Typically takes 24-48 hours to obtain.  [place] Scrape, [place]

## 2018-01-19 NOTE — Evaluation (Signed)
Occupational Therapy Evaluation Patient Details Name: Ricky Barnett MRN: [id number] DOB: Sep 23, 1940 Today's Date: 01/19/2018    History of Present Illness Pt is a 77 y/o male with pacemaker induced LBBB and high grade heart block who is s/p pacemaker lead extraction and revision of lead placement that was complicated by need for subclavian repair as well. PMH includes HTN and DM.    Clinical Impression   PTA, pt was living with his wife and was performing BADLs. Pt currently requiring Mod A for UB ADLs and Max A for LB ADLs. Pt presenting with poor cognition, awareness, balance, strength, and adherence to precautions. Pt would benefit from further acute OT to facilitate safe dc. Recommend dc to SNF for further OT to optimize safety, independence with ADLs, and return to PLOF.      Follow Up Recommendations  SNF;Supervision/Assistance - 24 hour    Equipment Recommendations  Other (comment);Tub/shower seat(Defer to next venue)    Recommendations for Other Services PT consult     Precautions / Restrictions Precautions Precautions: Fall;ICD/Pacemaker Precaution Comments: Reviewed pacemaker precautions with pt and family, however, pt required continuous multimodal cues to maintain them.  Restrictions Weight Bearing Restrictions: Yes LUE Weight Bearing: (pacemaker precautions )      Mobility Bed Mobility Overal bed mobility: Needs Assistance Bed Mobility: Supine to Sit;Sit to Supine     Supine to sit: Max assist Sit to supine: Min assist   General bed mobility comments: Max A to manage BLEs and elevate trunk. Min A to assist with LEs in returning to bed  Transfers Overall transfer level: Needs assistance Equipment used: 1 person hand held assist Transfers: Sit to/from Stand Sit to Stand: Mod assist         General transfer comment: Mod A to power up into standing and then gain balance. Poor awareness and max cues for LUE WB status    Balance Overall balance  assessment: Needs assistance Sitting-balance support: No upper extremity supported;Feet supported Sitting balance-Leahy Scale: Fair   Postural control: Left lateral lean Standing balance support: Single extremity supported;During functional activity Standing balance-Leahy Scale: Poor Standing balance comment: Reliant on UE and external support                            ADL either performed or assessed with clinical judgement   ADL Overall ADL's : Needs assistance/impaired Eating/Feeding: Set up;Sitting   Grooming: Set up;Supervision/safety;Sitting   Upper Body Bathing: Moderate assistance;Sitting   Lower Body Bathing: Maximal assistance;Sit to/from stand   Upper Body Dressing : Moderate assistance;Sitting   Lower Body Dressing: Maximal assistance;Sit to/from stand   Toilet Transfer: Moderate assistance;Ambulation(simulated in room) Toilet Transfer Details (indicate cue type and reason): Pt with poor balance and safety awareness         Functional mobility during ADLs: Moderate assistance;Maximal assistance General ADL Comments: Pt requiriung Mod A for functional mobility and Max A during LOB for balance correction and fall prevention     Vision         Perception     Praxis      Pertinent Vitals/Pain Pain Assessment: No/denies pain     Hand Dominance     Extremity/Trunk Assessment Upper Extremity Assessment Upper Extremity Assessment: LUE deficits/detail LUE Deficits / Details: Limited ROM secondary to pacemaker precautions. Edema at LUE    Lower Extremity Assessment Lower Extremity Assessment: Generalized weakness   Cervical / Trunk Assessment Cervical / Trunk Assessment: Kyphotic;Other exceptions  Cervical / Trunk Exceptions: Left lateral lean    Communication Communication Communication: No difficulties   Cognition Arousal/Alertness: Awake/alert Behavior During Therapy: WFL for tasks assessed/performed Overall Cognitive Status:  Impaired/Different from baseline Area of Impairment: Memory;Following commands;Safety/judgement;Awareness;Problem solving;Attention                   Current Attention Level: Sustained Memory: Decreased short-term memory;Decreased recall of precautions Following Commands: Follows one step commands with increased time Safety/Judgement: Decreased awareness of deficits;Decreased awareness of safety Awareness: Emergent Problem Solving: Slow processing;Requires tactile cues;Requires verbal cues General Comments: Pt very unaware of pacemaker precautions despite max education about importance of maintaining them. Reports "I'm going to use this thing alot" meaning his LUE. Pt requiring Max cues throughout for participation as well. Poor awareness, safety, attention, and memory   General Comments  Pt's wife present throughout session and agreeable to SNF    Exercises Exercises: Other exercises Other Exercises Other Exercises: Educating pt's wife and edema management with elevate and gentle ROM (within precautions)   Shoulder Instructions      Home Living Family/patient expects to be discharged to:: Private residence Living Arrangements: Spouse/significant other Available Help at Discharge: Family;Available PRN/intermittently Type of Home: House Home Access: Stairs to enter [employer] of Steps: 4 Entrance Stairs-Rails: Right;Left;Can reach both Home Layout: One level     Bathroom Shower/Tub: Tub/shower unit;Door;Curtain   Biochemist, clinical: Standard     Home Equipment: Cane - single point          Prior Functioning/Environment Level of Independence: Independent with assistive device(s)        Comments: Wife reports pt occasionally uses cane.         OT Problem List: Decreased strength;Decreased range of motion;Decreased activity tolerance;Impaired balance (sitting and/or standing);Decreased safety awareness;Decreased knowledge of use of DME or AE;Decreased  knowledge of precautions      OT Treatment/Interventions: Self-care/ADL training;Therapeutic exercise;Energy conservation;DME and/or AE instruction;Therapeutic activities;Patient/family education    OT Goals(Current goals can be found in the care plan section) Acute Rehab OT Goals Patient Stated Goal: For pt to be able to do for himself before going home per wife.  OT Goal Formulation: With patient Time For Goal Achievement: 02/02/18 Potential to Achieve Goals: Good  OT Frequency: Min 2X/week   Barriers to D/C:            Co-evaluation              AM-PAC PT "6 Clicks" Daily Activity     Outcome Measure Help from another person eating meals?: A Little Help from another person taking care of personal grooming?: A Little Help from another person toileting, which includes using toliet, bedpan, or urinal?: A Lot Help from another person bathing (including washing, rinsing, drying)?: A Lot Help from another person to put on and taking off regular upper body clothing?: A Lot Help from another person to put on and taking off regular lower body clothing?: A Lot 6 Click Score: 14   End of Session Equipment Utilized During Treatment: Gait belt Nurse Communication: Mobility status;Precautions  Activity Tolerance: Patient limited by fatigue;Patient limited by lethargy;Other (comment)(Limited by cognition) Patient left: in bed;with call bell/phone within reach;with bed alarm set;with family/visitor present  OT Visit Diagnosis: Unsteadiness on feet (R26.81);Other abnormalities of gait and mobility (R26.89);Muscle weakness (generalized) (M62.81);Other symptoms and signs involving cognitive function                Time: 2956-2130 OT Time Calculation (min): 23 min Charges:  OT General Charges $OT Visit: 1 Visit OT Evaluation $OT Eval Moderate Complexity: 1 Mod OT Treatments $Self Care/Home Management : 8-22 mins  Porcia Morganti MSOT, OTR/L Acute Rehab Pager: [phone] Office:  [place] 01/19/2018, 8:48 AM

## 2018-01-19 NOTE — Plan of Care (Signed)

## 2018-01-19 NOTE — Consult Note (Signed)
   [facility] CM Inpatient Consult   01/19/2018  Ricky Barnett [birth year] [id number]  Patient is currently active with [facility] for chronic disease management services.  Patient has been engaged by a [facility] and [facility] Social Worker.  Our community based plan of care has focused on disease management and community resource support. Patient and wife is currently awaiting insurance authorization for post hospital rehab needs. Met with patient and wife at the bedside.  Patient is seated up in the chair. Wife endorses ongoing [place] [facility] follow up needs. Will alert [facility] Management staff of post hospital disposition and needs.    Inpatient Case Manager aware that [facility] following prior to admission in progress meeting.. Of note, [facility] Care Management services does not replace or interfere with any services that are needed or arranged by inpatient case management or social work.  For additional questions or referrals please contact:  Natividad Brood, RN BSN [facility] Liaison  [phone] business mobile phone Toll free office [phone]

## 2018-01-19 NOTE — Clinical Social Work Note (Signed)
Insurance authorization still pending.  Graeson Nouri, CSW [phone]  

## 2018-01-20 ENCOUNTER — Other Ambulatory Visit: Payer: Self-pay

## 2018-01-20 DIAGNOSIS — Z23 Encounter for immunization: Secondary | ICD-10-CM | POA: Diagnosis not present

## 2018-01-20 NOTE — Care Management Important Message (Signed)
Important Message  Patient Details  Name: Ricky Barnett MRN: [id number] Date of Birth: [birth year]   Medicare Important Message Given:  Yes    [name] 01/20/2018, 3:03 PM

## 2018-01-20 NOTE — Patient Outreach (Signed)
[name]) Care Management  01/20/2018  Ricky Barnett [birth year] [id number]  Successful outreach to the patients wife on today's date, HIPAA identifiers confirmed. BSW confirmed patient to be home and no diet changes. BSW placed an order for diabetic meals through the [employer] benefit. BSW given a delivery date of 11/27 but informed it may range between 11/26-11/29 due to holiday.   BSW discussed CSW referral to assess patient needs in the home and level of care. Wife agreeable to referral. BSW to sign off as no other community resource needs have been identified at this time.  Daneen Schick, BSW, CDP [facility] [phone]

## 2018-01-20 NOTE — Clinical Social Work Placement (Signed)
   CLINICAL SOCIAL WORK PLACEMENT  NOTE  Date:  01/20/2018  Patient Details  Name: Ricky Barnett MRN: [id number] Date of Birth: 1940/04/15  Clinical Social Work is seeking post-discharge placement for this patient at the [place] level of care (*CSW will initial, date and re-position this form in  chart as items are completed):  Yes   Patient/family provided with [place] Work Department's list of facilities offering this level of care within the geographic area requested by the patient (or if unable, by the patient's family).  Yes   Patient/family informed of their freedom to choose among providers that offer the needed level of care, that participate in Medicare, Medicaid or managed care program needed by the patient, have an available bed and are willing to accept the patient.  Yes   Patient/family informed of 's ownership interest in [facility] and [facility], as well as of the fact that they are under no obligation to receive care at these facilities.  PASRR submitted to EDS on 01/18/18     PASRR number received on 01/18/18     Existing PASRR number confirmed on       FL2 transmitted to all facilities in geographic area requested by pt/family on 01/18/18     FL2 transmitted to all facilities within larger geographic area on       Patient informed that his/her managed care company has contracts with or will negotiate with certain facilities, including the following:        Yes   Patient/family informed of bed offers received.  Patient chooses bed at [facility]     Physician recommends and patient chooses bed at      Patient to be transferred to [facility] on 01/20/18.  Patient to be transferred to facility by [facility]     Patient family notified on 01/20/18 of transfer.  Name of family member notified:  Morrell Riddle     PHYSICIAN Please prepare prescriptions     Additional Comment:     _______________________________________________ Candie Chroman, LCSW 01/20/2018, 8:25 AM

## 2018-01-20 NOTE — Progress Notes (Addendum)
   Electrophysiology Rounding Note  Patient Name: Ricky Barnett Date of Encounter: 01/20/2018  Primary Cardiologist: [place] Electrophysiologist: Lovena Le   Subjective   Patient is doing OK, ambulating in room with tech, he denies any CP, SOB or pain.  Wife at bedside  Inpatient Medications    Scheduled Meds: . furosemide  40 mg Oral Daily  . metoprolol succinate  50 mg Oral Daily  . multivitamin with minerals  1 tablet Oral Daily  . potassium chloride SA  20 mEq Oral Daily  . pravastatin  40 mg Oral Daily  . sacubitril-valsartan  1 tablet Oral BID  . tamsulosin  0.4 mg Oral QPC supper   Continuous Infusions:  PRN Meds: acetaminophen, ondansetron (ZOFRAN) IV   Vital Signs    Vitals:   01/19/18 1011 01/19/18 1227 01/19/18 2031 01/20/18 0511  BP: 120/65 114/61 110/67 97/79  Pulse: 91 84 86 90  Resp:  18 20 16   Temp:  98.2 F (36.8 C) 99 F (37.2 C) 97.9 F (36.6 C)  TempSrc:  Oral Oral Oral  SpO2:  99% 100% 100%  Weight:    96.2 kg  Height:        Intake/Output Summary (Last 24 hours) at 01/20/2018 0816 Last data filed at 01/20/2018 0745 Gross per 24 hour  Intake 720 ml  Output 1075 ml  Net -355 ml   Filed Weights   01/18/18 0611 01/19/18 0551 01/20/18 0511  Weight: 100.2 kg 99.3 kg 96.2 kg    Physical Exam    GEN- The patient is elderly appearing, alert.   Head- normocephalic, atraumatic Eyes-  Sclera clear, conjunctiva pink Ears- hearing intact Oropharynx- clear Neck- supple Lungs- CTA b/l, normal work of breathing Heart- RRR  GI- soft, NT, ND Extremities- no clubbing, cyanosis, or edema Skin- no rash or lesion Psych- euthymic mood, full affect Neuro- no gross deficits appreciated  PPM site, dry, no he,atoma, pt denies pain  Labs    CBC No results for input(s): WBC, NEUTROABS, HGB, HCT, MCV, PLT in the last 72 hours. Basic Metabolic Panel No results for input(s): NA, K, CL, CO2, GLUCOSE, BUN, CREATININE, CALCIUM, MG, PHOS in the last 72  hours.   Telemetry    Off tele  Radiology    No results found.  Assessment & Plan    1.  High grade heart block S/p extraction of previously implanted RV lead and upgrade of device complicated by subclavian venous bleeding Site remains stable  2.  Confusion  Has been seen by PT and SNF recommended Improved, likely some baseline dementia Ready for discharge/discharged yesterday, d/w RN, SW note reviewed, held pending insurance authorization/placement for SNF   Dr. Lovena Le has seen and examined the patient Medical issues remain stable, remains ready for discharge  The patient and wife decline SNF, prefer to go [facility] with [facility], case manager is on case. OK to discharge once [place] arrangements are in place   For questions or updates, please contact [place] Please consult [email or web address] for contact info under Cardiology/STEMI.  Signed, Baldwin [place], PA-C  01/20/2018, 8:16 AM   EP Attending  Patient seen and examined. Agree with above. The patient is stable today. We await DC to SNF. Could consider going home however.  Mikle Bosworth.D.

## 2018-01-20 NOTE — Clinical Social Work Note (Addendum)
CSW facilitated patient discharge including contacting patient family and facility to confirm patient discharge plans. Clinical information faxed to facility and family agreeable with plan. CSW arranged ambulance transport via [facility] to [facility] at 10:30 am. RN to call report prior to discharge [phone]).  CSW will sign off for now as social work intervention is no longer needed. Please consult [place] again if new needs arise.  Dayton Scrape, [place] [phone]  8:53 am  Patient and his wife now want to return home with HHPT now. Patient and his wife feel that he is ambulating better than before and that he can be managed at home. They confirmed they are 100% sure about this. RNCM aware. Paged PA to notify. Left message for SNF clinical liaison. Transport has been cancelled.  CSW signing off.   Dayton Scrape, [place]

## 2018-01-20 NOTE — Progress Notes (Signed)
Patient discharged: Home with wife and HH  Via: Wheelchair   Discharge paperwork given: to patient and family by Ginger, RN  Reviewed with teach back   Belongings given to patient

## 2018-01-20 NOTE — Clinical Social Work Note (Signed)
Patient has insurance approval to discharge to [facility] SNF today.  Ricky Barnett, Ricky Barnett

## 2018-01-20 NOTE — Care Management Note (Signed)
Case Management Note  Patient Details  Name: Ricky Barnett MRN: [id number] Date of Birth: 21-Sep-1940  Subjective/Objective:     CHF              Action/Plan: Patient lives at home with spouse; Primary Care Provider: Merrilee Seashore, MD; has private insurance with [facility] Medicare with prescription drug coverage; [facility] [address]; patient is requesting to return home at discharge; [facility] choice offered, pt chose [place]; Dan with [facility] called for arrangments.DME - cane at home.  Expected Discharge Date:  01/20/18               Expected Discharge Plan:  [place]  In-House Referral:  Clinical Social Work  Discharge planning Services  CM Consult  Choice offered to:  Patient, Spouse  HH Arranged:  RN, Disease Management, PT, OT, Nurse's Aide [place] Agency:  [place]  Status of Service:  In process, will continue to follow  [name] [phone] 01/20/2018, 10:13 AM

## 2018-01-21 ENCOUNTER — Encounter (HOSPITAL_COMMUNITY): Payer: Self-pay | Admitting: Internal Medicine

## 2018-01-21 ENCOUNTER — Other Ambulatory Visit: Payer: Self-pay | Admitting: *Deleted

## 2018-01-21 ENCOUNTER — Encounter: Payer: Self-pay | Admitting: *Deleted

## 2018-01-21 NOTE — Patient Outreach (Signed)
Ricky Barnett) Care Management  01/21/2018  Ricky Barnett Sep 10, 1940 [id number]   CSW made several attempts to try and contact patient's wife, Ricky Barnett today to perform the initial phone assessment on patient, as well as assess and assist with social work needs and services, but received an automated recording stating, "The person you have dialed is not able to receive calls at this time".  CSW was unable to leave a HIPAA compliant message for Ricky Barnett on voicemail.  CSW will make a second outreach attempt within the next 3-4 business days, if a return call is not received from Ricky Barnett in the meantime.  CSW will also mail an Outreach Letter to patient's home requesting that patient and/or Ricky Barnett contact CSW if they are interested in receiving social work services through [place] with Scientist, clinical (histocompatibility and immunogenetics). Ricky Barnett, BSW, MSW, LCSW  Licensed Education officer, environmental [facility]  Mailing [place] [address], [place], [place] [address] Physical Address-[address], [place], [place] [address] Toll Free Main # [phone] Fax # [phone] Cell # [phone]  Office # [phone] [name]

## 2018-01-21 NOTE — Patient Outreach (Signed)
[facility]) Care Management  01/21/2018  RANBIR CHEW 08-Sep-1940 [id number]   Member was discharged from hospital yesterday for pacemaker, primary MD office will complete transition of care assessment.  Member was to discharge to SNF however member wanted to go home instead. Call placed to member's wife to follow up on discharge.  She report he is progressing well, [place] will visit on 11/22.  PCP appointment scheduled for 11/25, Cardiology for 11/27.  Transportation confirmed for 11/25, working on transportation for cardiology appointment.  State she has had contact with [facility] BSW, meals restart next week.  Denies any urgent concerns, advised to contact this care manager with questions.  Will follow up within the next month.  [facility] CM Care Plan Problem One     Most Recent Value  Care Plan Problem One  at risk for readmission as evidence by recent admission.  Role Documenting the Problem One  Care Management [place] for Problem One  Active  [facility] Long Term Goal   client will not be readmitted within the next 31 days.  [facility] Long Term Goal Start Date  12/11/17  [facility] Long Term Goal Met Date  01/21/18  [facility] CM Short Term Goal #1   client will weigh daily within the next 30 days.  [facility] CM Short Term Goal #1 Start Date  12/11/17  [facility] CM Short Term Goal #1 Met Date  01/21/18  [facility] CM Short Term Goal #2   client/caregiver will record weights within the next 30 days.   [facility] CM Short Term Goal #2 Start Date  12/11/17  [facility] CM Short Term Goal #2 Met Date  01/21/18    [facility] CM Care Plan Problem Two     Most Recent Value  Care Plan Problem Two  Risk for injury related to recent surgery for pacemaker as evidenced by recent hospitaliization  Role Documenting the Problem Two  Care Management [place] for Problem Two  Active  Interventions for Problem Two Long Term Goal   Educated on complications of surgery, including infection.  [place] Term Goal  Member will not  report any complications of pacer implant within the next 31 days  [facility] Long Term Goal Start Date  01/21/18  [facility] CM Short Term Goal #1   Member will keep and attend follwo up appointment with cardiology within the next week  [facility] CM Short Term Goal #1 Start Date  01/21/18  Interventions for Short Term Goal #2   Educated on the importance of attending appointment in effort to assess pacer and incision. Confirmed member will have transportation  [facility] CM Short Term Goal #2   Member will report compliance with home health for PT over the next 4 weeks  [facility] CM Short Term Goal #2 Start Date  01/21/18  Interventions for Short Term Goal #2  Confirmed home health is active, advised wife to follow up on home bath aide.     [name], [place], MSN [place] [phone]

## 2018-01-22 ENCOUNTER — Ambulatory Visit: Payer: Self-pay

## 2018-01-22 DIAGNOSIS — E119 Type 2 diabetes mellitus without complications: Secondary | ICD-10-CM | POA: Diagnosis not present

## 2018-01-22 DIAGNOSIS — T82837D Hemorrhage of cardiac prosthetic devices, implants and grafts, subsequent encounter: Secondary | ICD-10-CM | POA: Diagnosis not present

## 2018-01-22 DIAGNOSIS — I447 Left bundle-branch block, unspecified: Secondary | ICD-10-CM | POA: Diagnosis not present

## 2018-01-22 DIAGNOSIS — I5022 Chronic systolic (congestive) heart failure: Secondary | ICD-10-CM | POA: Diagnosis not present

## 2018-01-22 DIAGNOSIS — Z9181 History of falling: Secondary | ICD-10-CM | POA: Diagnosis not present

## 2018-01-22 DIAGNOSIS — Z7901 Long term (current) use of anticoagulants: Secondary | ICD-10-CM | POA: Diagnosis not present

## 2018-01-22 DIAGNOSIS — Z7984 Long term (current) use of oral hypoglycemic drugs: Secondary | ICD-10-CM | POA: Diagnosis not present

## 2018-01-22 DIAGNOSIS — I110 Hypertensive heart disease with heart failure: Secondary | ICD-10-CM | POA: Diagnosis not present

## 2018-01-25 ENCOUNTER — Other Ambulatory Visit: Payer: Self-pay | Admitting: *Deleted

## 2018-01-25 ENCOUNTER — Ambulatory Visit: Payer: Self-pay | Admitting: *Deleted

## 2018-01-25 DIAGNOSIS — I509 Heart failure, unspecified: Secondary | ICD-10-CM | POA: Diagnosis not present

## 2018-01-25 DIAGNOSIS — I10 Essential (primary) hypertension: Secondary | ICD-10-CM | POA: Diagnosis not present

## 2018-01-25 DIAGNOSIS — E782 Mixed hyperlipidemia: Secondary | ICD-10-CM | POA: Diagnosis not present

## 2018-01-25 DIAGNOSIS — R6889 Other general symptoms and signs: Secondary | ICD-10-CM | POA: Diagnosis not present

## 2018-01-25 DIAGNOSIS — E118 Type 2 diabetes mellitus with unspecified complications: Secondary | ICD-10-CM | POA: Diagnosis not present

## 2018-01-25 DIAGNOSIS — Z09 Encounter for follow-up examination after completed treatment for conditions other than malignant neoplasm: Secondary | ICD-10-CM | POA: Diagnosis not present

## 2018-01-25 DIAGNOSIS — E1169 Type 2 diabetes mellitus with other specified complication: Secondary | ICD-10-CM | POA: Diagnosis not present

## 2018-01-25 NOTE — Patient Outreach (Signed)
[place] [facility]) Care Management  01/25/2018  Ricky Barnett 1940/09/22 [id number]   Notified that member triggered red on EMMI heart failure dashboard 11/23 for not weighing self and not having a scale.  Call placed to member's wife to follow up on call.  She report member is doing well but he has not been able to weigh since discharge because their scale broke.  She state she is not able to afford a new one.  Advised that [facility] will order/provide one.  She expresses gratitude, this care manager will follow up within the next 2 weeks.  [name], [place], MSN [place] [phone]

## 2018-01-26 ENCOUNTER — Telehealth: Payer: Self-pay | Admitting: Internal Medicine

## 2018-01-26 DIAGNOSIS — Z7901 Long term (current) use of anticoagulants: Secondary | ICD-10-CM | POA: Diagnosis not present

## 2018-01-26 DIAGNOSIS — I110 Hypertensive heart disease with heart failure: Secondary | ICD-10-CM | POA: Diagnosis not present

## 2018-01-26 DIAGNOSIS — Z7984 Long term (current) use of oral hypoglycemic drugs: Secondary | ICD-10-CM | POA: Diagnosis not present

## 2018-01-26 DIAGNOSIS — I5022 Chronic systolic (congestive) heart failure: Secondary | ICD-10-CM | POA: Diagnosis not present

## 2018-01-26 DIAGNOSIS — T82837D Hemorrhage of cardiac prosthetic devices, implants and grafts, subsequent encounter: Secondary | ICD-10-CM | POA: Diagnosis not present

## 2018-01-26 DIAGNOSIS — I447 Left bundle-branch block, unspecified: Secondary | ICD-10-CM | POA: Diagnosis not present

## 2018-01-26 DIAGNOSIS — Z9181 History of falling: Secondary | ICD-10-CM | POA: Diagnosis not present

## 2018-01-26 DIAGNOSIS — E119 Type 2 diabetes mellitus without complications: Secondary | ICD-10-CM | POA: Diagnosis not present

## 2018-01-26 NOTE — Telephone Encounter (Signed)
New message    Ricky Barnett is calling from [facility] for skilled nursing and wound care if there is any. Please call.

## 2018-01-26 NOTE — Telephone Encounter (Signed)
Follow up     Ricky Barnett is calling for orders for pt.

## 2018-01-27 ENCOUNTER — Encounter: Payer: Self-pay | Admitting: *Deleted

## 2018-01-27 ENCOUNTER — Other Ambulatory Visit: Payer: Self-pay | Admitting: *Deleted

## 2018-01-27 ENCOUNTER — Ambulatory Visit: Payer: Medicare HMO

## 2018-01-27 NOTE — Patient Outreach (Signed)
[facility]) Care Management  01/27/2018  TACUMA GRAFFAM 1940-08-31 [id number]   CSW was able to make initial contact with patient's wife, Rocio Wolak today to perform the phone assessment on patient, as well as assess and assist with social work needs and services.  CSW introduced self, explained role and types of services provided through [place] [facility] ([place] [facility]). CSW further explained to Mrs. Farinas that [place] works with patient's RNCM, also with [place] [facility], [name]. CSW then explained the reason for the call, indicating that Mrs. Orene Desanctis thought that patient would benefit from social work services and resources to assist with assessing patient's home situation.  CSW obtained two HIPAA compliant identifiers from Mrs. Nedrow, which included patient's name and date of birth.  Mrs. Weathers admits that patient has been doing well since discharging from [facility] on Tuesday, January 19, 2018, where the following procedures were performed:  extraction of an RV pacing lead, insertion of a new RV pacing lead and insertion of a new left ventricular pacing lead.  It was recommended that patient go into a skilled nursing facility for short-term rehabilitative services; however, patient declined, wanting to return home to live with his wife and receive home health services.  Home Health Services were arranged through [place], in the form of a nurse, physical therapist, occupational therapist and an aide.  Mrs. Bungert indicated that patient is working well with therapies and may be discharged from their services soon.  Mrs. Savidge admitted that they are having problems with transportation, having to cancel one of patient's appointments this morning because patient had no way to get there.  Mrs. Menna went on to say that they have applied for SCAT [employer]), but never received a return call.  CSW agreed to  place a referral to Daneen Schick, social work Social worker, also with [place] [facility], to follow-up with [facility] to check the status of their applications.  Mrs. Peggye Ley will then make contact with patient and Mrs. Coto within the next 10 business days to report findings.  Mrs. Herbers denied having any follow-up appointment scheduled for patient in which transportation needed to be arranged.  Mrs. Rewerts indicated that they have still not received meals through the [facility] Well Dine Program, inquiring as to when CSW thought their meals should begin being delivered.  After thorough review of patient's chart, CSW noted that Mrs. Humble placed an order for diabetic meals for patient through patient's Well Dine benefit on Wednesday, January 20, 2018.  At the time of the referral, Mrs. Peggye Ley was given a delivery date of Wednesday, January 27, 2018, but informed it may range between Tuesday, January 26, 2018 and Friday, January 29, 2018, due to the Thanksgiving holiday.  CSW explained all of this to Mrs. Custis, reporting that patient's 30-day supply of meals may arrive today, or as late as Friday.  Mrs. Gago voiced understanding and was agreeable to this plan.  CSW will perform a case closure on patient, as all goals of treatment have been met from social work standpoint and no additional social work needs have been identified at this time.  CSW will notify patient's RNCM with [place] [facility], [name] of CSW's plans to close patient's case.  CSW will fax an update to patient's Primary Care Physician, Dr. Merrilee Seashore  to ensure that they are aware of CSW's involvement with patient's plan of care.  CSW  was able to ensure that Mrs. Sobol has the correct contact information for CSW, encouraging Mrs. [place] to contact CSW directly if additional social work needs arise in the near future.  Nat Christen, BSW, MSW, LCSW  Licensed [facility]  Mailing [place] [address] [place], [place] [address] Physical Address-[address], [place], [place] [address] Toll Free Main # [phone] Fax # [phone] Cell # [phone]  Office # [phone] [email or web address]

## 2018-01-27 NOTE — Telephone Encounter (Signed)
Orders returned via fax.  No further action needed.

## 2018-01-29 ENCOUNTER — Encounter: Payer: Self-pay | Admitting: *Deleted

## 2018-01-29 ENCOUNTER — Other Ambulatory Visit: Payer: Self-pay | Admitting: *Deleted

## 2018-01-29 DIAGNOSIS — I5022 Chronic systolic (congestive) heart failure: Secondary | ICD-10-CM | POA: Diagnosis not present

## 2018-01-29 DIAGNOSIS — I110 Hypertensive heart disease with heart failure: Secondary | ICD-10-CM | POA: Diagnosis not present

## 2018-01-29 DIAGNOSIS — I447 Left bundle-branch block, unspecified: Secondary | ICD-10-CM | POA: Diagnosis not present

## 2018-01-29 DIAGNOSIS — Z7984 Long term (current) use of oral hypoglycemic drugs: Secondary | ICD-10-CM | POA: Diagnosis not present

## 2018-01-29 DIAGNOSIS — T82837D Hemorrhage of cardiac prosthetic devices, implants and grafts, subsequent encounter: Secondary | ICD-10-CM | POA: Diagnosis not present

## 2018-01-29 DIAGNOSIS — Z9181 History of falling: Secondary | ICD-10-CM | POA: Diagnosis not present

## 2018-01-29 DIAGNOSIS — E119 Type 2 diabetes mellitus without complications: Secondary | ICD-10-CM | POA: Diagnosis not present

## 2018-01-29 DIAGNOSIS — Z7901 Long term (current) use of anticoagulants: Secondary | ICD-10-CM | POA: Diagnosis not present

## 2018-01-29 NOTE — Patient Outreach (Signed)
[facility]) Care Management [facility] CM Telephone Outreach, Care Coordination EMMI [place] notification- coping Post-hospital discharge day # 9  01/29/2018  [name] [birth year] [id number]  Successful telephone outreach attempt to Morrell Riddle, spouse/ caregiver of Ricky Barnett, 77 y/o male referred to [facility] after recent hospitalization November 15-20, 2019 for CHF exacerbation.  HIPAA/ identity verified with patient's spouse today.  Received notification that patient triggered red on EMMI heart failure dashboard 01/27/18 for coping/ loss of interest; discussed reason for today's call with patient's caregiver and explained that I was covering for primary [place]; patient's spouse states that patient is "doing just fine," and she denies that he is having issues/ problems around coping post-hospital discharge; states, "he is doing great" and further adds that she has no concerns around patient's coping.  States patient is not sad/ depressed, or "feeling bad in any way."  Caregiver further stated that the home health nurse "just left" and also thought that patient "was doing really good."  Caregiver further adds that they received new scales "just about an hour ago;" which was ordered by primary [facility] RN CCM earlier this week.  Spouse states that they have not yet had time to set up the scales, but will do so this afternoon.  Encouraged patient's spouse to promptly begin monitoring and recording daily weights, and reviewed with caregiver weight gain guidelines in setting of CHF, to report weight gain > 3 lbs overnight, 5 lbs in one week to care providers promptly.  Discussed with caregiver that often weight gain is the earliest indicator of fluid retention as rationale for monitoring and recording daily weights.  Caregiver verbalized understanding and agreed to begin monitoring/ recording patient's daily weights promptly.  Patient's caregiver denies further  issues, concerns, or problems today.  I confirmed that patient has primary [place] phone number, the main [place] office phone number, and the [facility] CM 24-hour nurse advice phone number should issues arise prior to next scheduled [place] outreach.  Shared with caregiver that I would make primary RN CCM aware of our conversation today.  Plan:  Will update patient's primary [facility] Community RN CM on today's successful telephone outreach around [place] red-flag notification  Oneta Rack, RN, BSN, [employer] Coordinator [facility] Care Management  [phone]

## 2018-02-01 ENCOUNTER — Other Ambulatory Visit: Payer: Self-pay

## 2018-02-01 ENCOUNTER — Ambulatory Visit: Payer: Medicare HMO

## 2018-02-01 NOTE — Patient Outreach (Signed)
[facility]) Care Management  02/01/2018  [name] 06/04/1940 [id number]  Successful outreach to the patients wife Ricky Barnett on today's date, HIPAA identifiers confirmed. BSW discussed recent patient referral to this BSW from [place] in regards to the patients Well Dine delivery and [employer]AT application. [name] confirms the patient received Well Dine meals on Friday November 29. [name] denies contact by SCAT to arrange eligibility hearing. BSW reminded [name] she is to call the eligibility office to arrange. BSW provided [name] with the SCAT eligibility contact number.  BSW received an incoming call from [name] who stated she was successful in arranging an eligibility interview for December 11 at 11:00 am. Mrs. Swim stated she and her husband are to be transported by the SCAT program to this interview. BSW to perform a discipline closure as social work goals have been met. [name] is agreeable with this plan and understands the patient will remain active with [place].  Daneen Schick, BSW, CDP [facility] [phone]

## 2018-02-02 DIAGNOSIS — I110 Hypertensive heart disease with heart failure: Secondary | ICD-10-CM | POA: Diagnosis not present

## 2018-02-02 DIAGNOSIS — Z7984 Long term (current) use of oral hypoglycemic drugs: Secondary | ICD-10-CM | POA: Diagnosis not present

## 2018-02-02 DIAGNOSIS — I5022 Chronic systolic (congestive) heart failure: Secondary | ICD-10-CM | POA: Diagnosis not present

## 2018-02-02 DIAGNOSIS — Z7901 Long term (current) use of anticoagulants: Secondary | ICD-10-CM | POA: Diagnosis not present

## 2018-02-02 DIAGNOSIS — E119 Type 2 diabetes mellitus without complications: Secondary | ICD-10-CM | POA: Diagnosis not present

## 2018-02-02 DIAGNOSIS — I447 Left bundle-branch block, unspecified: Secondary | ICD-10-CM | POA: Diagnosis not present

## 2018-02-02 DIAGNOSIS — Z9181 History of falling: Secondary | ICD-10-CM | POA: Diagnosis not present

## 2018-02-02 DIAGNOSIS — T82837D Hemorrhage of cardiac prosthetic devices, implants and grafts, subsequent encounter: Secondary | ICD-10-CM | POA: Diagnosis not present

## 2018-02-03 ENCOUNTER — Ambulatory Visit (INDEPENDENT_AMBULATORY_CARE_PROVIDER_SITE_OTHER): Payer: Medicare HMO | Admitting: Cardiovascular Disease

## 2018-02-03 ENCOUNTER — Encounter: Payer: Self-pay | Admitting: Cardiovascular Disease

## 2018-02-03 VITALS — BP 118/60 | HR 83 | Ht 71.0 in | Wt 209.2 lb

## 2018-02-03 DIAGNOSIS — I480 Paroxysmal atrial fibrillation: Secondary | ICD-10-CM

## 2018-02-03 DIAGNOSIS — I440 Atrioventricular block, first degree: Secondary | ICD-10-CM | POA: Diagnosis not present

## 2018-02-03 DIAGNOSIS — Z7901 Long term (current) use of anticoagulants: Secondary | ICD-10-CM

## 2018-02-03 DIAGNOSIS — Z950 Presence of cardiac pacemaker: Secondary | ICD-10-CM

## 2018-02-03 DIAGNOSIS — I472 Ventricular tachycardia: Secondary | ICD-10-CM | POA: Diagnosis not present

## 2018-02-03 DIAGNOSIS — I441 Atrioventricular block, second degree: Secondary | ICD-10-CM

## 2018-02-03 DIAGNOSIS — F09 Unspecified mental disorder due to known physiological condition: Secondary | ICD-10-CM | POA: Diagnosis not present

## 2018-02-03 DIAGNOSIS — I471 Supraventricular tachycardia: Secondary | ICD-10-CM | POA: Diagnosis not present

## 2018-02-03 DIAGNOSIS — I5042 Chronic combined systolic (congestive) and diastolic (congestive) heart failure: Secondary | ICD-10-CM

## 2018-02-03 DIAGNOSIS — R6889 Other general symptoms and signs: Secondary | ICD-10-CM | POA: Diagnosis not present

## 2018-02-03 NOTE — Progress Notes (Signed)
Patient ID: Ricky Barnett, male   DOB: August 03, 1940, 77 y.o.   MRN: [id number]      Cardiology Office Note   Date:  02/03/2018   ID:  Ricky Barnett, DOB 07/27/1940, MRN [id number]  PCP:  Merrilee Seashore, MD  Cardiologist:   Sanda Klein, MD   Chief Complaint  Patient presents with  . Congestive Heart Failure  . Pacemaker Check    Follow-up after biventricular upgrade      History of Present Illness: Ricky Barnett is a 77 y.o. male who presents for  Pacemaker follow-up, paroxysmal atrial flutter and chronic systolic heart failure.   On November 15 he had a complicated pacemaker upgrade procedure.  The right subclavian vein was occluded.  The right ventricular pacing lead was extracted, with implantation of a new RV pacing lead as well as a new left ventricular coronary sinus lead.  The procedure was successful but he did have a lot of bleeding requiring surgical subclavian vein repair.  During that hospitalization he had some problems with confusion and sundowning.  Upon discharge she had a brief transitional stay in a skilled nursing facility.  He reports feeling much better.  He no longer has dyspnea with light activity and has no complaints of orthopnea, PND or edema.  He denies palpitations dizziness or syncope.  He has not had any angina pectoris.  He has had much fewer episodes of confusion since his device upgrade, this is confirmed by his wife.  His pacemaker site is healing very nicely and there is no evidence of swelling, infection or bleeding.  Discharge weight on November 20 was 212 pounds.  On our office scale today he is 3 pounds less..  Device check shows 94% biventricular pacing and only 1% atrial pacing.  He has not had any atrial fibrillation or ventricular tachycardia.  Left ventricular pacing thresholds have changed an initial device implantation in the best vector is currently LV 2-can with a pacing threshold of 4.5 V at 1.0 ms.  He is not pacemaker dependent.  He  does have frequent previous need for pacing of the ventricle due to second-degree AV block.[place] Kitchen   He presented in 2014 with symptomatic bradycardia secondary to second-degree atrioventricular block, Mobitz type I. He received a dual-chamber Engineer, site pacemaker.  Just before receiving the pacemaker, he underwent a stress myocardial perfusion study that showed normal findings and an ejection fraction of 56%. An echocardiogram that was performed after pacemaker implantation, January 2015 showed mildly reduced EF of 45-50% but was otherwise normal study.  In 2019 echo shows LV dilation, EF 35-40% with global hypokinesis and pseudo-normal mitral inflow, moderate pulmonary artery hypertension with estimated systolic PA pressure 66 mmHg.  Prior to upgrade to a biventricular pacemaker his old device did record some episodes of atrial flutter with 3: 1 and 4: 1 AV conduction.   Past Medical History:  Diagnosis Date  . Angina    just started recently ,2-3 days ago-went to see Dr. Einar Gip 05/14/2011  . Arthritis    bil knees  . Chronic kidney disease   . Diabetes mellitus    Type II  . Dysrhythmia    2-3 yrs ago  . Headache(784.0)    occas  . Hypertension   . Left ventricular dysfunction   . Mastoiditis    right ear  . Pacemaker 11/29/2012   dual chamber  . Palpitations     Past Surgical History:  Procedure Laterality Date  . BIV UPGRADE N/A  12/04/2017   Procedure: Dual Chamber PPM upgrading to BIV PPM;  Surgeon: Evans Lance, MD;  Location: [facility] CV LAB;  Service: Cardiovascular;  Laterality: N/A;  . CYSTECTOMY     under chin 1983  . FINGER SURGERY     states he cut finger off when cutting wood  . INSERT / REPLACE / REMOVE PACEMAKER  11/29/2012   [employer]   . NASAL SINUS SURGERY     left side  . PACEMAKER LEAD REMOVAL N/A 01/15/2018   Procedure: PACEMAKER LEAD REMOVAL,REMOVE ONE LEAD,OCCLUDED ACCESS,PUT IN BIVENTRICULAR PACEMAKER;  Surgeon: Evans Lance,  MD;  Location: [facility];  Service: Cardiovascular;  Laterality: N/A;  . PERMANENT PACEMAKER INSERTION N/A 11/29/2012   Procedure: PERMANENT PACEMAKER INSERTION;  Surgeon: Sanda Klein, MD;  Location: [facility] CATH LAB;  Service: Cardiovascular;  Laterality: N/A;  . TYMPANOPLASTY  05/19/2011   Procedure: TYMPANOPLASTY;  Surgeon: Thornell Sartorius, MD;  Location: [facility];  Service: ENT;  Laterality: Right;  Exploratory Tympanotomy     Current Outpatient Medications  Medication Sig Dispense Refill  . acetaminophen (TYLENOL) 325 MG tablet Take 1-2 tablets (325-650 mg total) by mouth every 4 (four) hours as needed. (Patient taking differently: Take 325-650 mg by mouth every 4 (four) hours as needed (for pain.). )    . apixaban (ELIQUIS) 5 MG TABS tablet Take 1 tablet (5 mg total) by mouth 2 (two) times daily. 60 tablet 0  . furosemide (LASIX) 40 MG tablet Take 1 tablet (40 mg total) by mouth daily. 90 tablet 3  . metFORMIN (GLUCOPHAGE-XR) 750 MG 24 hr tablet Take 1,500 mg by mouth every evening.     . metoprolol succinate (TOPROL-XL) 50 MG 24 hr tablet Take 50 mg by mouth daily. Take with or immediately following a meal.     . Multiple Vitamin (MULTIVITAMIN) tablet Take 1 tablet by mouth daily.    . potassium chloride SA (K-DUR,KLOR-CON) 20 MEQ tablet Take 1 tablet (20 mEq total) by mouth daily. 90 tablet 3  . pravastatin (PRAVACHOL) 40 MG tablet Take 1 tablet (40 mg total) by mouth daily. 90 tablet 2  . sacubitril-valsartan (ENTRESTO) 97-103 MG Take 1 tablet by mouth 2 (two) times daily. 60 tablet 3  . Tamsulosin HCl (FLOMAX) 0.4 MG CAPS Take 0.4 mg by mouth daily after supper.     No current facility-administered medications for this visit.     Allergies:   Patient has no known allergies.    Social History:  The patient  reports that he has never smoked. He has never used smokeless tobacco. He reports that he does not drink alcohol or use drugs.   Family History:  The patient's family history includes  Heart attack in his father.    ROS:  Please see the history of present illness.    Otherwise, review of systems positive for none.   All other systems are reviewed and are negative   PHYSICAL EXAM: VS:  BP 118/60   Pulse 83   Ht 5\' 11"  (1.803 m)   Wt 209 lb 3.2 oz (94.9 kg)   SpO2 99%   BMI 29.18 kg/m  , BMI Body mass index is 29.18 kg/m.  General: Alert, oriented x3, no distress, looks well, smiling broadly Head: no evidence of trauma, PERRL, EOMI, no exophtalmos or lid lag, no myxedema, no xanthelasma; normal ears, nose and oropharynx Neck: normal jugular venous pulsations and no hepatojugular reflux; brisk carotid pulses without delay and no carotid bruits  Chest: clear to auscultation, no signs of consolidation by percussion or palpation, normal fremitus, symmetrical and full respiratory excursions.  The left subclavian surgical site is without hematoma, redness or drainage.  The Steri-Strips are still in place. Cardiovascular: normal position and quality of the apical impulse, regular rhythm, normal first and second heart sounds, no murmurs, rubs or gallops Abdomen: no tenderness or distention, no masses by palpation, no abnormal pulsatility or arterial bruits, normal bowel sounds, no hepatosplenomegaly Extremities: no clubbing, cyanosis or edema; 2+ radial, ulnar and brachial pulses bilaterally; 2+ right femoral, posterior tibial and dorsalis pedis pulses; 2+ left femoral, posterior tibial and dorsalis pedis pulses; no subclavian or femoral bruits Neurological: grossly nonfocal Psych: Normal mood and affect  EKG:  EKG is not ordered today.  The intracardiac electrogram shows atrial sensed, biventricular paced rhythm  Recent Labs: 12/08/2017: B Natriuretic Peptide 3,398.8 01/01/2018: BUN 36; Creatinine, Ser 2.06 01/15/2018: Potassium 5.0; Sodium 142 01/16/2018: Hemoglobin 11.3; Platelets 189    Lipid Panel No results found for: CHOL, TRIG, HDL, CHOLHDL, VLDL, LDLCALC,  LDLDIRECT    Wt Readings from Last 3 Encounters:  02/03/18 209 lb 3.2 oz (94.9 kg)  01/20/18 212 lb (96.2 kg)  01/13/18 220 lb (99.8 kg)   .   ASSESSMENT AND PLAN:  1.  CHF: Although the procedure itself was not simple, he seems to have already sustained substantial functional improvement from the biventricular pacemaker.  He has not had problems with overt heart failure and even his mental status seems improved.  Is probably premature to reevaluate left ventricular ejection fraction.  Continue the current dose of diuretic as well as metoprolol and Entresto (maximum dose).  Repeat his echocardiogram in 3 months.  Suspect that at least some of his left ventricular dysfunction was due to frequent right ventricular pacemaker secondary to second-degree atrioventricular block. 2. PAFib and flutter: His old pacemaker showed episodes of atrial flutter and atrial fibrillation, but none has occurred since his device upgrade.  He is on anticoagulation with Eliquis.  CHADSVasc score is 4 (age 82, heart failure, hypertension).   3. Eliquis; no serious bleeding problems since hospital discharge, well-tolerated. 4. Pacemaker: Normal device function.  Site is healing nicely.  Remote downloads every 3 months and yearly office visit.  He has an office appointment with Dr. Lovena Le in February.  I would plan to see him for heart failure 3 months after that. 5. Cognitive dysfunction: He probably has early dementia and had some evidence of confusion and sundowning while in the hospital.  He has shown remarkable improvement in cognitive function after the device upgrade, but we should be on the look out for slow deterioration in the future.   Patient Instructions  Medication Instructions:  Dr Sallyanne Kuster recommends that you continue on your current medications as directed. Please refer to the Current Medication list given to you today.  If you need a refill on your cardiac medications before your next appointment,  please call your pharmacy.   Follow-Up: At [facility], you and your health needs are our priority.  As part of our continuing mission to provide you with exceptional heart care, we have created designated Provider Care Teams.  These Care Teams include your primary Cardiologist (physician) and Advanced Practice Providers (APPs -  Physician Assistants and Nurse Practitioners) who all work together to provide you with the care you need, when you need it. You will need a follow up appointment in 5 months.  Please call our office 2 months in  advance to schedule this appointment.  You may see Sanda Klein, MD or one of the following Advanced Practice Providers on your designated Care Team: [place], [place] . Fabian Sharp, PA-C   Labs/ tests ordered today include:   No orders of the defined types were placed in this encounter.   Patient Instructions  Medication Instructions:  Dr Sallyanne Kuster recommends that you continue on your current medications as directed. Please refer to the Current Medication list given to you today.  If you need a refill on your cardiac medications before your next appointment, please call your pharmacy.   Follow-Up: At [facility], you and your health needs are our priority.  As part of our continuing mission to provide you with exceptional heart care, we have created designated Provider Care Teams.  These Care Teams include your primary Cardiologist (physician) and Advanced Practice Providers (APPs -  Physician Assistants and Nurse Practitioners) who all work together to provide you with the care you need, when you need it. You will need a follow up appointment in 5 months.  Please call our office 2 months in advance to schedule this appointment.  You may see Sanda Klein, MD or one of the following Advanced Practice Providers on your designated Care Team: [place], [place] . Fabian Sharp, PA-C     Signed, Sanda Klein, MD  02/03/2018 5:17 PM    Sanda Klein, MD,  [facility] [phone] office [phone] pager

## 2018-02-03 NOTE — Patient Instructions (Signed)
Medication Instructions:  Dr Sallyanne Kuster recommends that you continue on your current medications as directed. Please refer to the Current Medication list given to you today.  If you need a refill on your cardiac medications before your next appointment, please call your pharmacy.   Follow-Up: At [facility], you and your health needs are our priority.  As part of our continuing mission to provide you with exceptional heart care, we have created designated Provider Care Teams.  These Care Teams include your primary Cardiologist (physician) and Advanced Practice Providers (APPs -  Physician Assistants and Nurse Practitioners) who all work together to provide you with the care you need, when you need it. You will need a follow up appointment in 5 months.  Please call our office 2 months in advance to schedule this appointment.  You may see Sanda Klein, MD or one of the following Advanced Practice Providers on your designated Care Team: [place], [place] . Fabian Sharp, PA-C

## 2018-02-04 DIAGNOSIS — I447 Left bundle-branch block, unspecified: Secondary | ICD-10-CM | POA: Diagnosis not present

## 2018-02-04 DIAGNOSIS — Z9181 History of falling: Secondary | ICD-10-CM | POA: Diagnosis not present

## 2018-02-04 DIAGNOSIS — Z7984 Long term (current) use of oral hypoglycemic drugs: Secondary | ICD-10-CM | POA: Diagnosis not present

## 2018-02-04 DIAGNOSIS — I110 Hypertensive heart disease with heart failure: Secondary | ICD-10-CM | POA: Diagnosis not present

## 2018-02-04 DIAGNOSIS — T82837D Hemorrhage of cardiac prosthetic devices, implants and grafts, subsequent encounter: Secondary | ICD-10-CM | POA: Diagnosis not present

## 2018-02-04 DIAGNOSIS — Z7901 Long term (current) use of anticoagulants: Secondary | ICD-10-CM | POA: Diagnosis not present

## 2018-02-04 DIAGNOSIS — I5022 Chronic systolic (congestive) heart failure: Secondary | ICD-10-CM | POA: Diagnosis not present

## 2018-02-04 DIAGNOSIS — E119 Type 2 diabetes mellitus without complications: Secondary | ICD-10-CM | POA: Diagnosis not present

## 2018-02-08 ENCOUNTER — Other Ambulatory Visit: Payer: Self-pay | Admitting: *Deleted

## 2018-02-08 NOTE — Patient Outreach (Signed)
[place] [facility]) Care Management  02/08/2018  ANTONIN MEININGER 21-Oct-1940 [id number]   Notified by care management assistant that member triggered red on EMMI dashboard 12/8 for new/worsening problems, dizzy/lightheaded, nausea/vomiting.  Call placed to [facility] caregiver/wife.  She report member is doing well, last see by cardiologist on 12/4, no concerns noted.  She state member may have answered phone yesterday and didn't understand the questions.  She also state their phone "act up" sometimes and the [facility] operator may not have understood the answers or call was incomplete.  She denies any red flags, weight has been stable, no shortness of breath.  They have appointment with [facility] on 12/11 for assessment.  Denies any urgent concerns, will follow up within the next 2 weeks.  [name], [place], MSN [place] [phone]

## 2018-02-09 DIAGNOSIS — I110 Hypertensive heart disease with heart failure: Secondary | ICD-10-CM | POA: Diagnosis not present

## 2018-02-09 DIAGNOSIS — I447 Left bundle-branch block, unspecified: Secondary | ICD-10-CM | POA: Diagnosis not present

## 2018-02-09 DIAGNOSIS — Z7984 Long term (current) use of oral hypoglycemic drugs: Secondary | ICD-10-CM | POA: Diagnosis not present

## 2018-02-09 DIAGNOSIS — I5022 Chronic systolic (congestive) heart failure: Secondary | ICD-10-CM | POA: Diagnosis not present

## 2018-02-09 DIAGNOSIS — T82837D Hemorrhage of cardiac prosthetic devices, implants and grafts, subsequent encounter: Secondary | ICD-10-CM | POA: Diagnosis not present

## 2018-02-09 DIAGNOSIS — Z9181 History of falling: Secondary | ICD-10-CM | POA: Diagnosis not present

## 2018-02-09 DIAGNOSIS — E119 Type 2 diabetes mellitus without complications: Secondary | ICD-10-CM | POA: Diagnosis not present

## 2018-02-09 DIAGNOSIS — Z7901 Long term (current) use of anticoagulants: Secondary | ICD-10-CM | POA: Diagnosis not present

## 2018-02-18 ENCOUNTER — Other Ambulatory Visit: Payer: Self-pay | Admitting: *Deleted

## 2018-02-18 NOTE — Patient Outreach (Signed)
[facility] ([facility]) Care Management  02/18/2018  Ricky Barnett 02/26/1941 [id number]   Notified by care management assistant that member triggered red on EMMI heart failure dashboard for losing interest in things he normally enjoy.  Call placed to member, he report he is doing well, state he is much better then a few weeks ago.  "This the best I've done after any surgery."  Denies losing interest, denies signs of depression.  Denies any shortness of breath or chest discomfort. Report that he continues to weight daily, no drastic changes.    Will follow up within the next month.  If remain stable, will transition to health coach.  [facility] CM Care Plan Problem Two     Most Recent Value  Care Plan Problem Two  Risk for injury related to recent surgery for pacemaker as evidenced by recent hospitaliization  Role Documenting the Problem Two  Care Management Coordinator  Care Plan for Problem Two  Not Active  [facility] Long Term Goal  Member will not report any complications of pacer implant within the next 31 days  [facility] Long Term Goal Start Date  01/21/18  [facility] Long Term Goal Met Date  02/18/18  [facility] CM Short Term Goal #1   Member will keep and attend follwo up appointment with cardiology within the next week  [facility] CM Short Term Goal #1 Start Date  01/21/18  [facility] CM Short Term Goal #1 Met Date   02/18/18  [facility] CM Short Term Goal #2   Member will report compliance with home health for PT over the next 4 weeks  [facility] CM Short Term Goal #2 Start Date  01/21/18  [facility] CM Short Term Goal #2 Met Date  02/18/18       [place], RN, MSN [facility] Care Management  Community Care Manager [phone]  

## 2018-02-19 DIAGNOSIS — Z7901 Long term (current) use of anticoagulants: Secondary | ICD-10-CM | POA: Diagnosis not present

## 2018-02-19 DIAGNOSIS — I5022 Chronic systolic (congestive) heart failure: Secondary | ICD-10-CM | POA: Diagnosis not present

## 2018-02-19 DIAGNOSIS — E119 Type 2 diabetes mellitus without complications: Secondary | ICD-10-CM | POA: Diagnosis not present

## 2018-02-19 DIAGNOSIS — I447 Left bundle-branch block, unspecified: Secondary | ICD-10-CM | POA: Diagnosis not present

## 2018-02-19 DIAGNOSIS — Z9181 History of falling: Secondary | ICD-10-CM | POA: Diagnosis not present

## 2018-02-19 DIAGNOSIS — Z7984 Long term (current) use of oral hypoglycemic drugs: Secondary | ICD-10-CM | POA: Diagnosis not present

## 2018-02-19 DIAGNOSIS — I110 Hypertensive heart disease with heart failure: Secondary | ICD-10-CM | POA: Diagnosis not present

## 2018-02-19 DIAGNOSIS — T82837D Hemorrhage of cardiac prosthetic devices, implants and grafts, subsequent encounter: Secondary | ICD-10-CM | POA: Diagnosis not present

## 2018-02-22 DIAGNOSIS — Z9181 History of falling: Secondary | ICD-10-CM | POA: Diagnosis not present

## 2018-02-22 DIAGNOSIS — E119 Type 2 diabetes mellitus without complications: Secondary | ICD-10-CM | POA: Diagnosis not present

## 2018-02-22 DIAGNOSIS — I5022 Chronic systolic (congestive) heart failure: Secondary | ICD-10-CM | POA: Diagnosis not present

## 2018-02-22 DIAGNOSIS — T82837D Hemorrhage of cardiac prosthetic devices, implants and grafts, subsequent encounter: Secondary | ICD-10-CM | POA: Diagnosis not present

## 2018-02-22 DIAGNOSIS — Z7901 Long term (current) use of anticoagulants: Secondary | ICD-10-CM | POA: Diagnosis not present

## 2018-02-22 DIAGNOSIS — Z7984 Long term (current) use of oral hypoglycemic drugs: Secondary | ICD-10-CM | POA: Diagnosis not present

## 2018-02-22 DIAGNOSIS — I110 Hypertensive heart disease with heart failure: Secondary | ICD-10-CM | POA: Diagnosis not present

## 2018-02-22 DIAGNOSIS — I447 Left bundle-branch block, unspecified: Secondary | ICD-10-CM | POA: Diagnosis not present

## 2018-02-23 ENCOUNTER — Ambulatory Visit: Payer: Self-pay | Admitting: *Deleted

## 2018-03-04 ENCOUNTER — Encounter: Payer: Medicare HMO | Admitting: Internal Medicine

## 2018-03-05 ENCOUNTER — Telehealth: Payer: Self-pay | Admitting: Cardiology

## 2018-03-05 NOTE — Telephone Encounter (Signed)
Called and offered patient an appt for today at 3:30 or 4:00. Pt states that he doesn't have transportation. Patient agreed to Tuesday morning at 10:00.

## 2018-03-05 NOTE — Telephone Encounter (Signed)
Patient called and stated that he wants to see a doctor. Patient states that he gets a pain in the device site every now again and it happens when he sleeps on that side. He stated that he needed to have the bandage removed. Pt had a wound check scheduled for 01-27-2018 but he no showed that appointment. Scheduled to see MD on 04-21-2018. Informed pt that I would have a Nurse call him back regarding an appt for this matter. Pt verbalized understanding. He stated that the site is not hot, red, or swollen but the bandage is still in place.

## 2018-03-09 ENCOUNTER — Ambulatory Visit (INDEPENDENT_AMBULATORY_CARE_PROVIDER_SITE_OTHER): Payer: Medicare HMO | Admitting: Nurse Practitioner

## 2018-03-09 ENCOUNTER — Telehealth: Payer: Self-pay | Admitting: Cardiology

## 2018-03-09 ENCOUNTER — Encounter (INDEPENDENT_AMBULATORY_CARE_PROVIDER_SITE_OTHER): Payer: Self-pay

## 2018-03-09 DIAGNOSIS — I5042 Chronic combined systolic (congestive) and diastolic (congestive) heart failure: Secondary | ICD-10-CM | POA: Diagnosis not present

## 2018-03-09 LAB — CUP PACEART INCLINIC DEVICE CHECK
Implantable Lead Implant Date: 20140929
Implantable Lead Implant Date: 20191115
Implantable Lead Implant Date: 20191115
Implantable Lead Location: 753858
Implantable Lead Location: 753859
Implantable Lead Model: 4136
Implantable Lead Model: 4677
Implantable Lead Model: 7742
Implantable Lead Serial Number: 808106
MDC IDC LEAD LOCATION: 753859
MDC IDC LEAD SERIAL: 29473237
MDC IDC LEAD SERIAL: 801425
MDC IDC PG IMPLANT DT: 20191115
MDC IDC PG SERIAL: 737231
MDC IDC SESS DTM: 20200107102957

## 2018-03-09 NOTE — Telephone Encounter (Signed)
New Message:     Pt says he is waiting on his ride with [employer], they are running late.

## 2018-03-09 NOTE — Progress Notes (Signed)

## 2018-03-09 NOTE — Telephone Encounter (Signed)
Noted  

## 2018-03-10 DIAGNOSIS — Z7901 Long term (current) use of anticoagulants: Secondary | ICD-10-CM | POA: Diagnosis not present

## 2018-03-10 DIAGNOSIS — I110 Hypertensive heart disease with heart failure: Secondary | ICD-10-CM | POA: Diagnosis not present

## 2018-03-10 DIAGNOSIS — Z7984 Long term (current) use of oral hypoglycemic drugs: Secondary | ICD-10-CM | POA: Diagnosis not present

## 2018-03-10 DIAGNOSIS — T82837D Hemorrhage of cardiac prosthetic devices, implants and grafts, subsequent encounter: Secondary | ICD-10-CM | POA: Diagnosis not present

## 2018-03-10 DIAGNOSIS — Z9181 History of falling: Secondary | ICD-10-CM | POA: Diagnosis not present

## 2018-03-10 DIAGNOSIS — E119 Type 2 diabetes mellitus without complications: Secondary | ICD-10-CM | POA: Diagnosis not present

## 2018-03-10 DIAGNOSIS — I5022 Chronic systolic (congestive) heart failure: Secondary | ICD-10-CM | POA: Diagnosis not present

## 2018-03-10 DIAGNOSIS — I447 Left bundle-branch block, unspecified: Secondary | ICD-10-CM | POA: Diagnosis not present

## 2018-03-17 DIAGNOSIS — Z7984 Long term (current) use of oral hypoglycemic drugs: Secondary | ICD-10-CM | POA: Diagnosis not present

## 2018-03-17 DIAGNOSIS — T82837D Hemorrhage of cardiac prosthetic devices, implants and grafts, subsequent encounter: Secondary | ICD-10-CM | POA: Diagnosis not present

## 2018-03-17 DIAGNOSIS — Z7901 Long term (current) use of anticoagulants: Secondary | ICD-10-CM | POA: Diagnosis not present

## 2018-03-17 DIAGNOSIS — I110 Hypertensive heart disease with heart failure: Secondary | ICD-10-CM | POA: Diagnosis not present

## 2018-03-17 DIAGNOSIS — Z9181 History of falling: Secondary | ICD-10-CM | POA: Diagnosis not present

## 2018-03-17 DIAGNOSIS — E119 Type 2 diabetes mellitus without complications: Secondary | ICD-10-CM | POA: Diagnosis not present

## 2018-03-17 DIAGNOSIS — I5022 Chronic systolic (congestive) heart failure: Secondary | ICD-10-CM | POA: Diagnosis not present

## 2018-03-17 DIAGNOSIS — I447 Left bundle-branch block, unspecified: Secondary | ICD-10-CM | POA: Diagnosis not present

## 2018-03-19 ENCOUNTER — Encounter: Payer: Self-pay | Admitting: *Deleted

## 2018-03-19 ENCOUNTER — Other Ambulatory Visit: Payer: Self-pay | Admitting: *Deleted

## 2018-03-19 NOTE — Patient Outreach (Signed)
[facility]) Care Management  03/19/2018  Ricky Barnett [birth year] [id number]   Call placed to member to follow up on current health condition.  He report he is doing well, completed PT program this week.  State he has healed without complications, wife continue to provide support with daily weights and medication administration.  Denies the need for continued community involvement but does agree to involvement with health coach for disease management.  Will close to complex at this time and place order for health coach.  Will notify primary MD of transition .  [name], [place], MSN [name] [phone]

## 2018-03-23 LAB — CUP PACEART INCLINIC DEVICE CHECK
Implantable Lead Implant Date: 20191115
Implantable Lead Location: 753859
Implantable Lead Location: 753859
Implantable Lead Model: 4677
Implantable Lead Model: 7742
Implantable Pulse Generator Implant Date: 20191115
MDC IDC LEAD IMPLANT DT: 20140929
MDC IDC LEAD IMPLANT DT: 20191115
MDC IDC LEAD LOCATION: 753858
MDC IDC LEAD SERIAL: 29473237
MDC IDC LEAD SERIAL: 801425
MDC IDC LEAD SERIAL: 808106
MDC IDC SESS DTM: 20200121161946
Pulse Gen Serial Number: 737231

## 2018-04-06 LAB — CUP PACEART INCLINIC DEVICE CHECK
Date Time Interrogation Session: 20200204151144
Implantable Lead Implant Date: 20140929
Implantable Lead Model: 4137
Implantable Lead Serial Number: 29365186
Implantable Pulse Generator Implant Date: 20191115
MDC IDC LEAD LOCATION: 753860
MDC IDC PG SERIAL: 737231

## 2018-04-12 ENCOUNTER — Other Ambulatory Visit: Payer: Self-pay | Admitting: *Deleted

## 2018-04-12 NOTE — Patient Outreach (Signed)
[facility]) Care Management  04/12/2018   Ricky Barnett [birth year] [id number]  RN Health Coach telephone call to patient.  [employer] compliance verified. Per patient he is doing good.Patient stated that he is weighing everyday. He is eating a low sodium diet and adhering to it. Per patient his wife is a Marine scientist and is making sure that he is following everything. Patient is aware of zones and action plan. Patient feels that he has all he needs for his care.   Current Medications:  Current Outpatient Medications  Medication Sig Dispense Refill  . acetaminophen (TYLENOL) 325 MG tablet Take 1-2 tablets (325-650 mg total) by mouth every 4 (four) hours as needed. (Patient taking differently: Take 325-650 mg by mouth every 4 (four) hours as needed (for pain.). )    . apixaban (ELIQUIS) 5 MG TABS tablet Take 1 tablet (5 mg total) by mouth 2 (two) times daily. 60 tablet 0  . furosemide (LASIX) 40 MG tablet Take 1 tablet (40 mg total) by mouth daily. 90 tablet 3  . metFORMIN (GLUCOPHAGE-XR) 750 MG 24 hr tablet Take 1,500 mg by mouth every evening.     . metoprolol succinate (TOPROL-XL) 50 MG 24 hr tablet Take 50 mg by mouth daily. Take with or immediately following a meal.     . Multiple Vitamin (MULTIVITAMIN) tablet Take 1 tablet by mouth daily.    . potassium chloride SA (K-DUR,KLOR-CON) 20 MEQ tablet Take 1 tablet (20 mEq total) by mouth daily. 90 tablet 3  . pravastatin (PRAVACHOL) 40 MG tablet Take 1 tablet (40 mg total) by mouth daily. 90 tablet 2  . sacubitril-valsartan (ENTRESTO) 97-103 MG Take 1 tablet by mouth 2 (two) times daily. 60 tablet 3  . Tamsulosin HCl (FLOMAX) 0.4 MG CAPS Take 0.4 mg by mouth daily after supper.     No current facility-administered medications for this visit.     Functional Status:  In your present state of health, do you have any difficulty performing the following activities: 04/12/2018 01/27/2018  Hearing? N N  Vision? N N  Difficulty  concentrating or making decisions? N N  Walking or climbing stairs? Y Y  Dressing or bathing? N N  Doing errands, shopping? [place] and eating ? N N  Using the Toilet? N N  In the past six months, have you accidently leaked urine? N N  Do you have problems with loss of bowel control? N N  Managing your Medications? Y Y  Managing your Finances? N N  Housekeeping or managing your Housekeeping? Y Y  Some recent data might be hidden    Fall/Depression Screening: Fall Risk  04/12/2018 01/27/2018 12/23/2017  Falls in the past year? 0 1 No  Number falls in past yr: - 0 -  Injury with Fall? - 0 -  Risk for fall due to : - Impaired balance/gait;Impaired mobility -  Follow up - Education provided;Falls prevention discussed -   PHQ 2/9 Scores 04/12/2018 01/27/2018 12/23/2017  PHQ - 2 Score 0 0 0     Assessment:  Patient is weighing daily Patient is adhering to low sodium diet Patient is taking medications as prescribed Patient has met all care plan goals  Plan: RN sent additional educational material on low sodium diet.  RN closed case Patient has met his goals of care   [place] [phone]

## 2018-04-14 DIAGNOSIS — M542 Cervicalgia: Secondary | ICD-10-CM | POA: Diagnosis not present

## 2018-04-21 ENCOUNTER — Encounter: Payer: Self-pay | Admitting: Internal Medicine

## 2018-04-21 ENCOUNTER — Ambulatory Visit (INDEPENDENT_AMBULATORY_CARE_PROVIDER_SITE_OTHER): Payer: Medicare HMO | Admitting: Internal Medicine

## 2018-04-21 ENCOUNTER — Encounter (INDEPENDENT_AMBULATORY_CARE_PROVIDER_SITE_OTHER): Payer: Self-pay

## 2018-04-21 VITALS — BP 124/64 | HR 60 | Ht 71.0 in | Wt 214.0 lb

## 2018-04-21 DIAGNOSIS — I480 Paroxysmal atrial fibrillation: Secondary | ICD-10-CM

## 2018-04-21 DIAGNOSIS — Z950 Presence of cardiac pacemaker: Secondary | ICD-10-CM | POA: Diagnosis not present

## 2018-04-21 DIAGNOSIS — I5042 Chronic combined systolic (congestive) and diastolic (congestive) heart failure: Secondary | ICD-10-CM

## 2018-04-21 NOTE — Progress Notes (Signed)
HPI [name] returns today for followup after undergoing lead extraction to obtain access to his central circulation followed by insertion of a new biv PPM. He had problems with hemostasis when I saw him last. He has healed up nicely in the interim with no chest pain or sob. No syncope. No anginal symptoms.  No Known Allergies   Current Outpatient Medications  Medication Sig Dispense Refill  . acetaminophen (TYLENOL) 325 MG tablet Take 1-2 tablets (325-650 mg total) by mouth every 4 (four) hours as needed. (Patient taking differently: Take 325-650 mg by mouth every 4 (four) hours as needed (for pain.). )    . apixaban (ELIQUIS) 5 MG TABS tablet Take 1 tablet (5 mg total) by mouth 2 (two) times daily. 60 tablet 0  . furosemide (LASIX) 40 MG tablet Take 1 tablet (40 mg total) by mouth daily. 90 tablet 3  . metFORMIN (GLUCOPHAGE-XR) 750 MG 24 hr tablet Take 1,500 mg by mouth every evening.     . metoprolol succinate (TOPROL-XL) 50 MG 24 hr tablet Take 50 mg by mouth daily. Take with or immediately following a meal.     . Multiple Vitamin (MULTIVITAMIN) tablet Take 1 tablet by mouth daily.    . potassium chloride SA (K-DUR,KLOR-CON) 20 MEQ tablet Take 1 tablet (20 mEq total) by mouth daily. 90 tablet 3  . pravastatin (PRAVACHOL) 40 MG tablet Take 1 tablet (40 mg total) by mouth daily. 90 tablet 2  . predniSONE (DELTASONE) 10 MG tablet TAKE 4 TABLETS FOR 3 DAYS, THEN REDUCE BY 1 TABLET EVERY 3 DAYS UNTIL COMPLETED    . sacubitril-valsartan (ENTRESTO) 97-103 MG Take 1 tablet by mouth 2 (two) times daily. 60 tablet 3  . Tamsulosin HCl (FLOMAX) 0.4 MG CAPS Take 0.4 mg by mouth daily after supper.    Marland Kitchen tiZANidine (ZANAFLEX) 4 MG tablet Take 4 mg by mouth 3 (three) times daily as needed.    . traMADol (ULTRAM) 50 MG tablet Take 50 mg by mouth every 6 (six) hours as needed for moderate pain or severe pain.     No current facility-administered medications for this visit.      Past Medical  History:  Diagnosis Date  . Angina    just started recently ,2-3 days ago-went to see Dr. Einar Gip 05/14/2011  . Arthritis    bil knees  . Chronic kidney disease   . Diabetes mellitus    Type II  . Dysrhythmia    2-3 yrs ago  . Headache(784.0)    occas  . Hypertension   . Left ventricular dysfunction   . Mastoiditis    right ear  . Pacemaker 11/29/2012   dual chamber  . Palpitations     ROS:   All systems reviewed and negative except as noted in the HPI.   Past Surgical History:  Procedure Laterality Date  . BIV UPGRADE N/A 12/04/2017   Procedure: Dual Chamber PPM upgrading to BIV PPM;  Surgeon: Evans Lance, MD;  Location: [place] [facility];  Service: Cardiovascular;  Laterality: N/A;  . CYSTECTOMY     under chin 1983  . FINGER SURGERY     states he cut finger off when cutting wood  . INSERT / REPLACE / REMOVE PACEMAKER  11/29/2012   [employer]   . NASAL SINUS SURGERY     left side  . PACEMAKER LEAD REMOVAL N/A 01/15/2018   Procedure: PACEMAKER LEAD REMOVAL,REMOVE ONE LEAD,OCCLUDED ACCESS,PUT IN BIVENTRICULAR PACEMAKER;  Surgeon: Evans Lance, MD;  Location: [place];  Service: Cardiovascular;  Laterality: N/A;  . PERMANENT PACEMAKER INSERTION N/A 11/29/2012   Procedure: PERMANENT PACEMAKER INSERTION;  Surgeon: Sanda Klein, MD;  Location: [place] [facility];  Service: Cardiovascular;  Laterality: N/A;  . TYMPANOPLASTY  05/19/2011   Procedure: TYMPANOPLASTY;  Surgeon: Thornell Sartorius, MD;  Location: [facility] OR;  Service: ENT;  Laterality: Right;  Exploratory Tympanotomy     Family History  Problem Relation Age of Onset  . Heart attack Father      Social History   Socioeconomic History  . Marital status: Married    Spouse name: Not on file  . Number of children: Not on file  . Years of education: Not on file  . Highest education level: Not on file  Occupational History  . Not on file  Social Needs  . Financial resource strain: Not on file  . Food  insecurity:    Worry: Not on file    Inability: Not on file  . Transportation needs:    Medical: Not on file    Non-medical: Not on file  Tobacco Use  . Smoking status: Never Smoker  . Smokeless tobacco: Never Used  Substance and Sexual Activity  . Alcohol use: No  . Drug use: No  . Sexual activity: Not on file  Lifestyle  . Physical activity:    Days per week: Not on file    Minutes per session: Not on file  . Stress: Not on file  Relationships  . Social connections:    Talks on phone: Not on file    Gets together: Not on file    Attends religious service: Not on file    Active member of club or organization: Not on file    Attends meetings of clubs or organizations: Not on file    Relationship status: Not on file  . Intimate partner violence:    Fear of current or ex partner: Not on file    Emotionally abused: Not on file    Physically abused: Not on file    Forced sexual activity: Not on file  Other Topics Concern  . Not on file  Social History Narrative  . Not on file     BP 124/64   Pulse 60   Ht 5\' 11"  (1.803 m)   Wt 214 lb (97.1 kg)   BMI 29.85 kg/m   Physical Exam:  Well appearing NAD HEENT: Unremarkable Neck:  No JVD, no thyromegally Lymphatics:  No adenopathy Back:  No CVA tenderness Lungs:  Clear with no wheezes HEART:  Regular rate rhythm, no murmurs, no rubs, no clicks Abd:  soft, positive bowel sounds, no organomegally, no rebound, no guarding Ext:  2 plus pulses, no edema, no cyanosis, no clubbing Skin:  No rashes no nodules Neuro:  CN II through XII intact, motor grossly intact  DEVICE  Normal device function.  See PaceArt for details.   Assess/Plan: 1. Chronic systolic heart failure - his symptoms are class 2. He will continue his current meds. 2. PPM - his [employer] PPM is working normally. His RV and LV thresholds are under 1.5 volts. His outputs have been turned down to maximize battery longevity. 3. PAF - he is well  controlled. He will continue his current meds.  Ricky Barnett.D.

## 2018-04-21 NOTE — Patient Instructions (Signed)
Medication Instructions:  Your physician recommends that you continue on your current medications as directed. Please refer to the Current Medication list given to you today.  Labwork: None ordered.  Testing/Procedures: None ordered.  Follow-Up: Your physician wants you to follow-up in: one year with Dr. Lovena Le.   You will receive a reminder letter in the mail two months in advance. If you don't receive a letter, please call our office to schedule the follow-up appointment.  Remote monitoring is used to monitor your Pacemaker from home. This monitoring reduces the number of office visits required to check your device to one time per year. It allows [place] to keep an eye on the functioning of your device to ensure it is working properly. You are scheduled for a device check from home on 07/21/2018. You may send your transmission at any time that day. If you have a wireless device, the transmission will be sent automatically. After your physician reviews your transmission, you will receive a postcard with your next transmission date.  Any Other Special Instructions Will Be Listed Below (If Applicable).  If you need a refill on your cardiac medications before your next appointment, please call your pharmacy.

## 2018-05-01 ENCOUNTER — Other Ambulatory Visit: Payer: Self-pay | Admitting: Cardiology

## 2018-05-17 DIAGNOSIS — E118 Type 2 diabetes mellitus with unspecified complications: Secondary | ICD-10-CM | POA: Diagnosis not present

## 2018-05-17 DIAGNOSIS — D509 Iron deficiency anemia, unspecified: Secondary | ICD-10-CM | POA: Diagnosis not present

## 2018-05-17 DIAGNOSIS — E782 Mixed hyperlipidemia: Secondary | ICD-10-CM | POA: Diagnosis not present

## 2018-05-17 DIAGNOSIS — I10 Essential (primary) hypertension: Secondary | ICD-10-CM | POA: Diagnosis not present

## 2018-05-24 DIAGNOSIS — Z0000 Encounter for general adult medical examination without abnormal findings: Secondary | ICD-10-CM | POA: Diagnosis not present

## 2018-05-24 DIAGNOSIS — E782 Mixed hyperlipidemia: Secondary | ICD-10-CM | POA: Diagnosis not present

## 2018-05-24 DIAGNOSIS — I428 Other cardiomyopathies: Secondary | ICD-10-CM | POA: Diagnosis not present

## 2018-05-24 DIAGNOSIS — E1169 Type 2 diabetes mellitus with other specified complication: Secondary | ICD-10-CM | POA: Diagnosis not present

## 2018-05-24 DIAGNOSIS — I5042 Chronic combined systolic (congestive) and diastolic (congestive) heart failure: Secondary | ICD-10-CM | POA: Diagnosis not present

## 2018-05-24 DIAGNOSIS — I495 Sick sinus syndrome: Secondary | ICD-10-CM | POA: Diagnosis not present

## 2018-05-24 DIAGNOSIS — H7010 Chronic mastoiditis, unspecified ear: Secondary | ICD-10-CM | POA: Diagnosis not present

## 2018-05-24 DIAGNOSIS — I480 Paroxysmal atrial fibrillation: Secondary | ICD-10-CM | POA: Diagnosis not present

## 2018-05-24 DIAGNOSIS — I10 Essential (primary) hypertension: Secondary | ICD-10-CM | POA: Diagnosis not present

## 2018-05-24 DIAGNOSIS — E118 Type 2 diabetes mellitus with unspecified complications: Secondary | ICD-10-CM | POA: Diagnosis not present

## 2018-06-08 DIAGNOSIS — I5042 Chronic combined systolic (congestive) and diastolic (congestive) heart failure: Secondary | ICD-10-CM | POA: Diagnosis not present

## 2018-06-08 DIAGNOSIS — F0390 Unspecified dementia without behavioral disturbance: Secondary | ICD-10-CM | POA: Diagnosis not present

## 2018-06-08 DIAGNOSIS — M159 Polyosteoarthritis, unspecified: Secondary | ICD-10-CM | POA: Diagnosis not present

## 2018-06-08 DIAGNOSIS — E119 Type 2 diabetes mellitus without complications: Secondary | ICD-10-CM | POA: Diagnosis not present

## 2018-06-08 DIAGNOSIS — M542 Cervicalgia: Secondary | ICD-10-CM | POA: Diagnosis not present

## 2018-07-15 DIAGNOSIS — M47812 Spondylosis without myelopathy or radiculopathy, cervical region: Secondary | ICD-10-CM | POA: Diagnosis not present

## 2018-07-15 DIAGNOSIS — M5032 Other cervical disc degeneration, mid-cervical region, unspecified level: Secondary | ICD-10-CM | POA: Diagnosis not present

## 2018-07-21 ENCOUNTER — Ambulatory Visit (INDEPENDENT_AMBULATORY_CARE_PROVIDER_SITE_OTHER): Payer: Medicare HMO | Admitting: *Deleted

## 2018-07-21 ENCOUNTER — Other Ambulatory Visit: Payer: Self-pay

## 2018-07-21 DIAGNOSIS — I441 Atrioventricular block, second degree: Secondary | ICD-10-CM | POA: Diagnosis not present

## 2018-07-21 LAB — CUP PACEART REMOTE DEVICE CHECK
Battery Remaining Longevity: 126 mo
Battery Remaining Percentage: 100 %
Brady Statistic RA Percent Paced: 21 %
Brady Statistic RV Percent Paced: 94 %
Date Time Interrogation Session: 20200520124500
Implantable Lead Implant Date: 20140929
Implantable Lead Implant Date: 20191115
Implantable Lead Implant Date: 20191115
Implantable Lead Location: 753858
Implantable Lead Location: 753859
Implantable Lead Location: 753859
Implantable Lead Model: 4136
Implantable Lead Model: 4677
Implantable Lead Model: 7742
Implantable Lead Serial Number: 29473237
Implantable Lead Serial Number: 801425
Implantable Lead Serial Number: 808106
Implantable Pulse Generator Implant Date: 20191115
Lead Channel Impedance Value: 561 Ohm
Lead Channel Impedance Value: 631 Ohm
Lead Channel Impedance Value: 798 Ohm
Lead Channel Pacing Threshold Amplitude: 0.5 V
Lead Channel Pacing Threshold Amplitude: 0.7 V
Lead Channel Pacing Threshold Amplitude: 1.2 V
Lead Channel Pacing Threshold Pulse Width: 0.4 ms
Lead Channel Pacing Threshold Pulse Width: 0.4 ms
Lead Channel Pacing Threshold Pulse Width: 0.4 ms
Lead Channel Setting Pacing Amplitude: 2 V
Lead Channel Setting Pacing Amplitude: 2.5 V
Lead Channel Setting Pacing Amplitude: 2.5 V
Lead Channel Setting Pacing Pulse Width: 0.4 ms
Lead Channel Setting Pacing Pulse Width: 0.4 ms
Lead Channel Setting Sensing Sensitivity: 2.5 mV
Lead Channel Setting Sensing Sensitivity: 2.5 mV
Pulse Gen Serial Number: 737231

## 2018-07-30 ENCOUNTER — Encounter: Payer: Self-pay | Admitting: Cardiology

## 2018-07-30 NOTE — Progress Notes (Signed)
Remote pacemaker transmission.   

## 2018-09-11 ENCOUNTER — Other Ambulatory Visit: Payer: Self-pay | Admitting: Cardiovascular Disease

## 2018-10-06 DIAGNOSIS — M47812 Spondylosis without myelopathy or radiculopathy, cervical region: Secondary | ICD-10-CM | POA: Diagnosis not present

## 2018-10-06 DIAGNOSIS — M7918 Myalgia, other site: Secondary | ICD-10-CM | POA: Diagnosis not present

## 2018-10-06 DIAGNOSIS — M5032 Other cervical disc degeneration, mid-cervical region, unspecified level: Secondary | ICD-10-CM | POA: Diagnosis not present

## 2018-10-14 ENCOUNTER — Ambulatory Visit: Payer: Medicare HMO | Admitting: Cardiovascular Disease

## 2018-10-20 ENCOUNTER — Ambulatory Visit (INDEPENDENT_AMBULATORY_CARE_PROVIDER_SITE_OTHER): Payer: Medicare HMO | Admitting: *Deleted

## 2018-10-20 DIAGNOSIS — I441 Atrioventricular block, second degree: Secondary | ICD-10-CM | POA: Diagnosis not present

## 2018-10-21 LAB — CUP PACEART REMOTE DEVICE CHECK
Battery Remaining Longevity: 120 mo
Battery Remaining Percentage: 100 %
Brady Statistic RA Percent Paced: 24 %
Brady Statistic RV Percent Paced: 93 %
Date Time Interrogation Session: 20200820092926
Implantable Lead Implant Date: 20140929
Implantable Lead Implant Date: 20191115
Implantable Lead Implant Date: 20191115
Implantable Lead Location: 753858
Implantable Lead Location: 753859
Implantable Lead Location: 753859
Implantable Lead Model: 4136
Implantable Lead Model: 4677
Implantable Lead Model: 7742
Implantable Lead Serial Number: 29473237
Implantable Lead Serial Number: 801425
Implantable Lead Serial Number: 808106
Implantable Pulse Generator Implant Date: 20191115
Lead Channel Impedance Value: 551 Ohm
Lead Channel Impedance Value: 637 Ohm
Lead Channel Impedance Value: 828 Ohm
Lead Channel Pacing Threshold Amplitude: 0.4 V
Lead Channel Pacing Threshold Amplitude: 0.8 V
Lead Channel Pacing Threshold Amplitude: 1.2 V
Lead Channel Pacing Threshold Pulse Width: 0.4 ms
Lead Channel Pacing Threshold Pulse Width: 0.4 ms
Lead Channel Pacing Threshold Pulse Width: 0.4 ms
Lead Channel Setting Pacing Amplitude: 2 V
Lead Channel Setting Pacing Amplitude: 2.5 V
Lead Channel Setting Pacing Amplitude: 2.5 V
Lead Channel Setting Pacing Pulse Width: 0.4 ms
Lead Channel Setting Pacing Pulse Width: 0.4 ms
Lead Channel Setting Sensing Sensitivity: 2.5 mV
Lead Channel Setting Sensing Sensitivity: 2.5 mV
Pulse Gen Serial Number: 737231

## 2018-10-28 ENCOUNTER — Encounter: Payer: Self-pay | Admitting: Cardiology

## 2018-10-28 NOTE — Progress Notes (Signed)
Remote pacemaker transmission.   

## 2018-11-05 DIAGNOSIS — M47812 Spondylosis without myelopathy or radiculopathy, cervical region: Secondary | ICD-10-CM | POA: Diagnosis not present

## 2018-11-05 DIAGNOSIS — M791 Myalgia, unspecified site: Secondary | ICD-10-CM | POA: Diagnosis not present

## 2018-11-05 DIAGNOSIS — M5032 Other cervical disc degeneration, mid-cervical region, unspecified level: Secondary | ICD-10-CM | POA: Diagnosis not present

## 2018-11-15 ENCOUNTER — Other Ambulatory Visit: Payer: Self-pay | Admitting: Orthopaedic Surgery

## 2018-11-15 DIAGNOSIS — M47812 Spondylosis without myelopathy or radiculopathy, cervical region: Secondary | ICD-10-CM

## 2018-11-19 DIAGNOSIS — M6281 Muscle weakness (generalized): Secondary | ICD-10-CM | POA: Diagnosis not present

## 2018-11-19 DIAGNOSIS — M542 Cervicalgia: Secondary | ICD-10-CM | POA: Diagnosis not present

## 2018-11-19 DIAGNOSIS — G894 Chronic pain syndrome: Secondary | ICD-10-CM | POA: Diagnosis not present

## 2018-11-22 ENCOUNTER — Ambulatory Visit
Admission: RE | Admit: 2018-11-22 | Discharge: 2018-11-22 | Disposition: A | Discharge status: Home / Self Care | Payer: Medicare HMO | Source: Ambulatory Visit | Attending: Orthopaedic Surgery | Admitting: Orthopaedic Surgery

## 2018-11-22 DIAGNOSIS — Z950 Presence of cardiac pacemaker: Secondary | ICD-10-CM | POA: Diagnosis not present

## 2018-11-22 DIAGNOSIS — M47812 Spondylosis without myelopathy or radiculopathy, cervical region: Secondary | ICD-10-CM

## 2018-11-22 DIAGNOSIS — M4322 Fusion of spine, cervical region: Secondary | ICD-10-CM | POA: Diagnosis not present

## 2018-11-22 DIAGNOSIS — M4803 Spinal stenosis, cervicothoracic region: Secondary | ICD-10-CM | POA: Diagnosis not present

## 2018-11-22 DIAGNOSIS — Z8639 Personal history of other endocrine, nutritional and metabolic disease: Secondary | ICD-10-CM | POA: Diagnosis not present

## 2018-11-22 DIAGNOSIS — M2578 Osteophyte, vertebrae: Secondary | ICD-10-CM | POA: Diagnosis not present

## 2018-11-26 DIAGNOSIS — G894 Chronic pain syndrome: Secondary | ICD-10-CM | POA: Diagnosis not present

## 2018-11-26 DIAGNOSIS — M542 Cervicalgia: Secondary | ICD-10-CM | POA: Diagnosis not present

## 2018-11-26 DIAGNOSIS — M6281 Muscle weakness (generalized): Secondary | ICD-10-CM | POA: Diagnosis not present

## 2018-12-01 DIAGNOSIS — M542 Cervicalgia: Secondary | ICD-10-CM | POA: Diagnosis not present

## 2018-12-01 DIAGNOSIS — M6281 Muscle weakness (generalized): Secondary | ICD-10-CM | POA: Diagnosis not present

## 2018-12-01 DIAGNOSIS — G894 Chronic pain syndrome: Secondary | ICD-10-CM | POA: Diagnosis not present

## 2018-12-08 DIAGNOSIS — Z23 Encounter for immunization: Secondary | ICD-10-CM | POA: Diagnosis not present

## 2018-12-09 DIAGNOSIS — D509 Iron deficiency anemia, unspecified: Secondary | ICD-10-CM | POA: Diagnosis not present

## 2018-12-09 DIAGNOSIS — R5383 Other fatigue: Secondary | ICD-10-CM | POA: Diagnosis not present

## 2018-12-15 DIAGNOSIS — M47819 Spondylosis without myelopathy or radiculopathy, site unspecified: Secondary | ICD-10-CM | POA: Diagnosis not present

## 2018-12-15 DIAGNOSIS — I5042 Chronic combined systolic (congestive) and diastolic (congestive) heart failure: Secondary | ICD-10-CM | POA: Diagnosis not present

## 2018-12-15 DIAGNOSIS — E118 Type 2 diabetes mellitus with unspecified complications: Secondary | ICD-10-CM | POA: Diagnosis not present

## 2018-12-15 DIAGNOSIS — E790 Hyperuricemia without signs of inflammatory arthritis and tophaceous disease: Secondary | ICD-10-CM | POA: Diagnosis not present

## 2018-12-15 DIAGNOSIS — Z7189 Other specified counseling: Secondary | ICD-10-CM | POA: Diagnosis not present

## 2018-12-17 ENCOUNTER — Other Ambulatory Visit: Payer: Self-pay | Admitting: Cardiovascular Disease

## 2018-12-17 MED ORDER — SACUBITRIL-VALSARTAN 97-103 MG PO TABS: 1.0000 | ORAL_TABLET | Freq: Two times a day (BID) | ORAL | 3 refills | Status: DC

## 2018-12-21 DIAGNOSIS — E785 Hyperlipidemia, unspecified: Secondary | ICD-10-CM | POA: Diagnosis not present

## 2018-12-21 DIAGNOSIS — M542 Cervicalgia: Secondary | ICD-10-CM | POA: Diagnosis not present

## 2018-12-21 DIAGNOSIS — M546 Pain in thoracic spine: Secondary | ICD-10-CM | POA: Diagnosis not present

## 2018-12-21 DIAGNOSIS — M5030 Other cervical disc degeneration, unspecified cervical region: Secondary | ICD-10-CM | POA: Diagnosis not present

## 2018-12-21 DIAGNOSIS — M4690 Unspecified inflammatory spondylopathy, site unspecified: Secondary | ICD-10-CM | POA: Diagnosis not present

## 2018-12-21 DIAGNOSIS — E119 Type 2 diabetes mellitus without complications: Secondary | ICD-10-CM | POA: Diagnosis not present

## 2018-12-21 DIAGNOSIS — M549 Dorsalgia, unspecified: Secondary | ICD-10-CM | POA: Diagnosis not present

## 2018-12-21 DIAGNOSIS — M4810 Ankylosing hyperostosis [Forestier], site unspecified: Secondary | ICD-10-CM | POA: Diagnosis not present

## 2018-12-21 DIAGNOSIS — I10 Essential (primary) hypertension: Secondary | ICD-10-CM | POA: Diagnosis not present

## 2019-01-05 DIAGNOSIS — M542 Cervicalgia: Secondary | ICD-10-CM | POA: Diagnosis not present

## 2019-01-05 DIAGNOSIS — E118 Type 2 diabetes mellitus with unspecified complications: Secondary | ICD-10-CM | POA: Diagnosis not present

## 2019-01-05 DIAGNOSIS — Z7189 Other specified counseling: Secondary | ICD-10-CM | POA: Diagnosis not present

## 2019-01-07 DIAGNOSIS — E785 Hyperlipidemia, unspecified: Secondary | ICD-10-CM | POA: Diagnosis not present

## 2019-01-07 DIAGNOSIS — M459 Ankylosing spondylitis of unspecified sites in spine: Secondary | ICD-10-CM | POA: Diagnosis not present

## 2019-01-07 DIAGNOSIS — E119 Type 2 diabetes mellitus without complications: Secondary | ICD-10-CM | POA: Diagnosis not present

## 2019-01-07 DIAGNOSIS — M5030 Other cervical disc degeneration, unspecified cervical region: Secondary | ICD-10-CM | POA: Diagnosis not present

## 2019-01-07 DIAGNOSIS — M542 Cervicalgia: Secondary | ICD-10-CM | POA: Diagnosis not present

## 2019-01-07 DIAGNOSIS — M4690 Unspecified inflammatory spondylopathy, site unspecified: Secondary | ICD-10-CM | POA: Diagnosis not present

## 2019-01-07 DIAGNOSIS — M4810 Ankylosing hyperostosis [Forestier], site unspecified: Secondary | ICD-10-CM | POA: Diagnosis not present

## 2019-01-07 DIAGNOSIS — I10 Essential (primary) hypertension: Secondary | ICD-10-CM | POA: Diagnosis not present

## 2019-01-07 DIAGNOSIS — Z79899 Other long term (current) drug therapy: Secondary | ICD-10-CM | POA: Diagnosis not present

## 2019-01-19 ENCOUNTER — Ambulatory Visit (INDEPENDENT_AMBULATORY_CARE_PROVIDER_SITE_OTHER): Payer: Medicare HMO | Admitting: *Deleted

## 2019-01-19 DIAGNOSIS — I480 Paroxysmal atrial fibrillation: Secondary | ICD-10-CM

## 2019-01-19 DIAGNOSIS — I5042 Chronic combined systolic (congestive) and diastolic (congestive) heart failure: Secondary | ICD-10-CM

## 2019-01-20 LAB — CUP PACEART REMOTE DEVICE CHECK
Battery Remaining Longevity: 120 mo
Battery Remaining Percentage: 100 %
Brady Statistic RA Percent Paced: 22 %
Brady Statistic RV Percent Paced: 93 %
Date Time Interrogation Session: 20201118081900
Implantable Lead Implant Date: 20140929
Implantable Lead Implant Date: 20191115
Implantable Lead Implant Date: 20191115
Implantable Lead Location: 753858
Implantable Lead Location: 753859
Implantable Lead Location: 753859
Implantable Lead Model: 4136
Implantable Lead Model: 4677
Implantable Lead Model: 7742
Implantable Lead Serial Number: 29473237
Implantable Lead Serial Number: 801425
Implantable Lead Serial Number: 808106
Implantable Pulse Generator Implant Date: 20191115
Lead Channel Impedance Value: 540 Ohm
Lead Channel Impedance Value: 612 Ohm
Lead Channel Impedance Value: 761 Ohm
Lead Channel Pacing Threshold Amplitude: 0.4 V
Lead Channel Pacing Threshold Amplitude: 0.8 V
Lead Channel Pacing Threshold Amplitude: 1.2 V
Lead Channel Pacing Threshold Pulse Width: 0.4 ms
Lead Channel Pacing Threshold Pulse Width: 0.4 ms
Lead Channel Pacing Threshold Pulse Width: 0.4 ms
Lead Channel Setting Pacing Amplitude: 2 V
Lead Channel Setting Pacing Amplitude: 2.5 V
Lead Channel Setting Pacing Amplitude: 2.5 V
Lead Channel Setting Pacing Pulse Width: 0.4 ms
Lead Channel Setting Pacing Pulse Width: 0.4 ms
Lead Channel Setting Sensing Sensitivity: 2.5 mV
Lead Channel Setting Sensing Sensitivity: 2.5 mV
Pulse Gen Serial Number: 737231

## 2019-01-24 DIAGNOSIS — H25813 Combined forms of age-related cataract, bilateral: Secondary | ICD-10-CM | POA: Diagnosis not present

## 2019-01-24 DIAGNOSIS — E119 Type 2 diabetes mellitus without complications: Secondary | ICD-10-CM | POA: Diagnosis not present

## 2019-01-24 DIAGNOSIS — H40023 Open angle with borderline findings, high risk, bilateral: Secondary | ICD-10-CM | POA: Diagnosis not present

## 2019-02-16 NOTE — Progress Notes (Signed)
Remote pacemaker transmission.   

## 2019-03-30 DIAGNOSIS — I10 Essential (primary) hypertension: Secondary | ICD-10-CM | POA: Diagnosis not present

## 2019-03-30 DIAGNOSIS — I480 Paroxysmal atrial fibrillation: Secondary | ICD-10-CM | POA: Diagnosis not present

## 2019-03-30 DIAGNOSIS — I5042 Chronic combined systolic (congestive) and diastolic (congestive) heart failure: Secondary | ICD-10-CM | POA: Diagnosis not present

## 2019-03-30 DIAGNOSIS — E1169 Type 2 diabetes mellitus with other specified complication: Secondary | ICD-10-CM | POA: Diagnosis not present

## 2019-03-30 DIAGNOSIS — E118 Type 2 diabetes mellitus with unspecified complications: Secondary | ICD-10-CM | POA: Diagnosis not present

## 2019-03-30 DIAGNOSIS — R42 Dizziness and giddiness: Secondary | ICD-10-CM | POA: Diagnosis not present

## 2019-04-20 ENCOUNTER — Ambulatory Visit (INDEPENDENT_AMBULATORY_CARE_PROVIDER_SITE_OTHER): Payer: Medicare HMO | Admitting: *Deleted

## 2019-04-20 DIAGNOSIS — I5042 Chronic combined systolic (congestive) and diastolic (congestive) heart failure: Secondary | ICD-10-CM | POA: Diagnosis not present

## 2019-04-21 LAB — CUP PACEART REMOTE DEVICE CHECK
Battery Remaining Longevity: 114 mo
Battery Remaining Percentage: 100 %
Brady Statistic RA Percent Paced: 23 %
Brady Statistic RV Percent Paced: 93 %
Date Time Interrogation Session: 20210218095100
Implantable Lead Implant Date: 20140929
Implantable Lead Implant Date: 20191115
Implantable Lead Implant Date: 20191115
Implantable Lead Location: 753858
Implantable Lead Location: 753859
Implantable Lead Location: 753859
Implantable Lead Model: 4136
Implantable Lead Model: 4677
Implantable Lead Model: 7742
Implantable Lead Serial Number: 29473237
Implantable Lead Serial Number: 801425
Implantable Lead Serial Number: 808106
Implantable Pulse Generator Implant Date: 20191115
Lead Channel Impedance Value: 554 Ohm
Lead Channel Impedance Value: 605 Ohm
Lead Channel Impedance Value: 740 Ohm
Lead Channel Pacing Threshold Amplitude: 0.4 V
Lead Channel Pacing Threshold Amplitude: 0.9 V
Lead Channel Pacing Threshold Amplitude: 1.2 V
Lead Channel Pacing Threshold Pulse Width: 0.4 ms
Lead Channel Pacing Threshold Pulse Width: 0.4 ms
Lead Channel Pacing Threshold Pulse Width: 0.4 ms
Lead Channel Setting Pacing Amplitude: 2.2 V
Lead Channel Setting Pacing Amplitude: 2.5 V
Lead Channel Setting Pacing Amplitude: 2.5 V
Lead Channel Setting Pacing Pulse Width: 0.4 ms
Lead Channel Setting Pacing Pulse Width: 0.4 ms
Lead Channel Setting Sensing Sensitivity: 2.5 mV
Lead Channel Setting Sensing Sensitivity: 2.5 mV
Pulse Gen Serial Number: 737231

## 2019-04-21 NOTE — Progress Notes (Signed)
PPM Remote  

## 2019-04-23 ENCOUNTER — Ambulatory Visit: Payer: Medicare HMO | Attending: Internal Medicine

## 2019-04-23 DIAGNOSIS — Z23 Encounter for immunization: Secondary | ICD-10-CM | POA: Insufficient documentation

## 2019-04-23 NOTE — Progress Notes (Signed)
   Covid-19 Vaccination Clinic  Name:  Ricky Barnett    MRN: [id number] DOB: 08-Jun-1940  04/23/2019  [name] was observed post Covid-19 immunization for 15 minutes without incidence. He was provided with Vaccine Information Sheet and instruction to access the V-Safe system.   [name] was instructed to call 911 with any severe reactions post vaccine: Marland Kitchen Difficulty breathing  . Swelling of your face and throat  . A fast heartbeat  . A bad rash all over your body  . Dizziness and weakness    Immunizations Administered    Name Date Dose VIS Date Route   Pfizer COVID-19 Vaccine 04/23/2019  9:17 AM 0.3 mL 02/11/2019 Intramuscular   Manufacturer: [place]   Lot: [id number]   [place]: [id number]

## 2019-04-25 ENCOUNTER — Encounter: Payer: Medicare HMO | Admitting: Internal Medicine

## 2019-04-26 ENCOUNTER — Encounter: Payer: Self-pay | Admitting: Internal Medicine

## 2019-04-26 ENCOUNTER — Other Ambulatory Visit: Payer: Self-pay

## 2019-04-26 ENCOUNTER — Ambulatory Visit: Payer: Medicare HMO | Admitting: Internal Medicine

## 2019-04-26 VITALS — BP 138/80 | HR 67 | Ht 71.0 in | Wt 200.0 lb

## 2019-04-26 DIAGNOSIS — Z950 Presence of cardiac pacemaker: Secondary | ICD-10-CM | POA: Diagnosis not present

## 2019-04-26 DIAGNOSIS — I480 Paroxysmal atrial fibrillation: Secondary | ICD-10-CM | POA: Diagnosis not present

## 2019-04-26 DIAGNOSIS — I5042 Chronic combined systolic (congestive) and diastolic (congestive) heart failure: Secondary | ICD-10-CM

## 2019-04-26 NOTE — Patient Instructions (Signed)

## 2019-04-26 NOTE — Progress Notes (Signed)
HPI Mr. Westermann returns today for followup. He is a pleasant 79 yo man with a h/o chronic systolic heart failure, CHB, s/p DDD PM with up grade to a Biv PPM. He has done well in the interim. He has been on maximal medical therapy. He denies anginal symptoms or peripheral edema.  No Known Allergies   Current Outpatient Medications  Medication Sig Dispense Refill  . acetaminophen (TYLENOL) 325 MG tablet Take 1-2 tablets (325-650 mg total) by mouth every 4 (four) hours as needed.    Marland Kitchen ELIQUIS 5 MG TABS tablet TAKE 1 TABLET BY MOUTH TWICE A DAY 180 tablet 1  . furosemide (LASIX) 40 MG tablet Take 1 tablet (40 mg total) by mouth daily. 90 tablet 3  . KLOR-CON M20 20 MEQ tablet TAKE 1 TABLET BY MOUTH EVERY DAY 90 tablet 3  . metFORMIN (GLUCOPHAGE-XR) 750 MG 24 hr tablet Take 1,500 mg by mouth every evening.     . metoprolol succinate (TOPROL-XL) 50 MG 24 hr tablet Take 50 mg by mouth daily. Take with or immediately following a meal.     . Multiple Vitamin (MULTIVITAMIN) tablet Take 1 tablet by mouth daily.    . pravastatin (PRAVACHOL) 40 MG tablet TAKE 1 TABLET BY MOUTH EVERY DAY 90 tablet 0  . predniSONE (DELTASONE) 10 MG tablet TAKE 4 TABLETS FOR 3 DAYS, THEN REDUCE BY 1 TABLET EVERY 3 DAYS UNTIL COMPLETED    . sacubitril-valsartan (ENTRESTO) 97-103 MG Take 1 tablet by mouth 2 (two) times daily. 60 tablet 3  . Tamsulosin HCl (FLOMAX) 0.4 MG CAPS Take 0.4 mg by mouth daily after supper.    Marland Kitchen tiZANidine (ZANAFLEX) 4 MG tablet Take 4 mg by mouth 3 (three) times daily as needed.    . traMADol (ULTRAM) 50 MG tablet Take 50 mg by mouth every 6 (six) hours as needed for moderate pain or severe pain.     No current facility-administered medications for this visit.     Past Medical History:  Diagnosis Date  . Angina    just started recently ,2-3 days ago-went to see Dr. Einar Gip 05/14/2011  . Arthritis    bil knees  . Chronic kidney disease   . Diabetes mellitus    Type II  . Dysrhythmia    2-3 yrs ago  . Headache(784.0)    occas  . Hypertension   . Left ventricular dysfunction   . Mastoiditis    right ear  . Pacemaker 11/29/2012   dual chamber  . Palpitations     ROS:   All systems reviewed and negative except as noted in the HPI.   Past Surgical History:  Procedure Laterality Date  . BIV UPGRADE N/A 12/04/2017   Procedure: Dual Chamber PPM upgrading to BIV PPM;  Surgeon: Evans Lance, MD;  Location: [place] [facility];  Service: Cardiovascular;  Laterality: N/A;  . CYSTECTOMY     under chin 1983  . FINGER SURGERY     states he cut finger off when cutting wood  . INSERT / REPLACE / REMOVE PACEMAKER  11/29/2012   [employer]   . NASAL SINUS SURGERY     left side  . PACEMAKER LEAD REMOVAL N/A 01/15/2018   Procedure: PACEMAKER LEAD REMOVAL,REMOVE ONE LEAD,OCCLUDED ACCESS,PUT IN BIVENTRICULAR PACEMAKER;  Surgeon: Evans Lance, MD;  Location: [facility];  Service: Cardiovascular;  Laterality: N/A;  . PERMANENT PACEMAKER INSERTION N/A 11/29/2012   Procedure: PERMANENT PACEMAKER INSERTION;  Surgeon: Sanda Klein,  MD;  Location: [place] [facility];  Service: Cardiovascular;  Laterality: N/A;  . TYMPANOPLASTY  05/19/2011   Procedure: TYMPANOPLASTY;  Surgeon: Thornell Sartorius, MD;  Location: [facility] OR;  Service: ENT;  Laterality: Right;  Exploratory Tympanotomy     Family History  Problem Relation Age of Onset  . Heart attack Father      Social History   Socioeconomic History  . Marital status: Married    Spouse name: Not on file  . Number of children: Not on file  . Years of education: Not on file  . Highest education level: Not on file  Occupational History  . Not on file  Tobacco Use  . Smoking status: Never Smoker  . Smokeless tobacco: Never Used  Substance and Sexual Activity  . Alcohol use: No  . Drug use: No  . Sexual activity: Not on file  Other Topics Concern  . Not on file  Social History Narrative  . Not on file   Social Determinants of  Health   Financial Resource Strain:   . Difficulty of Paying Living Expenses: Not on file  Food Insecurity:   . Worried About Charity fundraiser in the Last Year: Not on file  . Ran Out of Food in the Last Year: Not on file  Transportation Needs:   . Lack of Transportation (Medical): Not on file  . Lack of Transportation (Non-Medical): Not on file  Physical Activity:   . Days of Exercise per Week: Not on file  . Minutes of Exercise per Session: Not on file  Stress:   . Feeling of Stress : Not on file  Social Connections:   . Frequency of Communication with Friends and Family: Not on file  . Frequency of Social Gatherings with Friends and Family: Not on file  . Attends Religious Services: Not on file  . Active Member of Clubs or Organizations: Not on file  . Attends Archivist Meetings: Not on file  . Marital Status: Not on file  Intimate Partner Violence:   . Fear of Current or Ex-Partner: Not on file  . Emotionally Abused: Not on file  . Physically Abused: Not on file  . Sexually Abused: Not on file     BP 138/80   Pulse 67   Ht 5\' 11"  (1.803 m)   Wt 200 lb (90.7 kg)   SpO2 99%   BMI 27.89 kg/m   Physical Exam:  Well appearing NAD HEENT: Unremarkable Neck:  No JVD, no thyromegally Lymphatics:  No adenopathy Back:  No CVA tenderness Lungs:  Clear with no wheezes HEART:  Regular rate rhythm, no murmurs, no rubs, no clicks Abd:  soft, positive bowel sounds, no organomegally, no rebound, no guarding Ext:  2 plus pulses, no edema, no cyanosis, no clubbing Skin:  No rashes no nodules Neuro:  CN II through XII intact, motor grossly intact  EKG - nsr with biv pacing  DEVICE  Normal device function.  See PaceArt for details.   Assess/Plan: 1. CHB - he is asymptomatic, s/p PPM insertion. 2. PPM - his [employer] biv PPM is working normally. We will recheck in several months. 3. Chronic systolic heart failure - his symptoms remain class 2. He will continue  his current meds. He will maintain a low sodium diet. 4. Dyslipidemia - he will continue statin therapy.  Mikle Bosworth.D.

## 2019-05-10 ENCOUNTER — Other Ambulatory Visit: Payer: Self-pay | Admitting: Internal Medicine

## 2019-05-16 ENCOUNTER — Ambulatory Visit: Payer: Medicare HMO | Attending: Internal Medicine

## 2019-05-16 DIAGNOSIS — Z23 Encounter for immunization: Secondary | ICD-10-CM

## 2019-05-16 NOTE — Progress Notes (Signed)
   Covid-19 Vaccination Clinic  Name:  Ricky Barnett    MRN: [id number] DOB: Jul 14, 1940  05/16/2019  Ricky Barnett was observed post Covid-19 immunization for 15 minutes without incident. He was provided with Vaccine Information Sheet and instruction to access the V-Safe system.   Ricky Barnett was instructed to call 911 with any severe reactions post vaccine: Marland Kitchen Difficulty breathing  . Swelling of face and throat  . A fast heartbeat  . A bad rash all over body  . Dizziness and weakness   Immunizations Administered    Name Date Dose VIS Date Route   Pfizer COVID-19 Vaccine 05/16/2019  8:24 AM 0.3 mL 02/11/2019 Intramuscular   Manufacturer: [place]   Lot: [id number]   [place]: [id number]

## 2019-06-06 ENCOUNTER — Encounter: Payer: Self-pay | Admitting: Internal Medicine

## 2019-06-06 DIAGNOSIS — I480 Paroxysmal atrial fibrillation: Secondary | ICD-10-CM | POA: Diagnosis not present

## 2019-06-06 DIAGNOSIS — M4810 Ankylosing hyperostosis [Forestier], site unspecified: Secondary | ICD-10-CM | POA: Diagnosis not present

## 2019-06-06 DIAGNOSIS — E118 Type 2 diabetes mellitus with unspecified complications: Secondary | ICD-10-CM | POA: Diagnosis not present

## 2019-06-06 DIAGNOSIS — I10 Essential (primary) hypertension: Secondary | ICD-10-CM | POA: Diagnosis not present

## 2019-06-06 DIAGNOSIS — E58 Dietary calcium deficiency: Secondary | ICD-10-CM | POA: Diagnosis not present

## 2019-06-06 DIAGNOSIS — I5042 Chronic combined systolic (congestive) and diastolic (congestive) heart failure: Secondary | ICD-10-CM | POA: Diagnosis not present

## 2019-06-13 ENCOUNTER — Other Ambulatory Visit: Payer: Self-pay | Admitting: Internal Medicine

## 2019-06-13 DIAGNOSIS — I480 Paroxysmal atrial fibrillation: Secondary | ICD-10-CM | POA: Diagnosis not present

## 2019-06-13 DIAGNOSIS — E118 Type 2 diabetes mellitus with unspecified complications: Secondary | ICD-10-CM | POA: Diagnosis not present

## 2019-06-13 DIAGNOSIS — Z0000 Encounter for general adult medical examination without abnormal findings: Secondary | ICD-10-CM | POA: Diagnosis not present

## 2019-06-13 DIAGNOSIS — I5042 Chronic combined systolic (congestive) and diastolic (congestive) heart failure: Secondary | ICD-10-CM | POA: Diagnosis not present

## 2019-06-13 DIAGNOSIS — I495 Sick sinus syndrome: Secondary | ICD-10-CM | POA: Diagnosis not present

## 2019-06-13 NOTE — Telephone Encounter (Addendum)
Pt last saw Dr Lovena Le 04/26/19, last labs 01/01/18 Creat 2.06, pt is overdue for labwork, age 79, weight 90.7kg, based on specified criteria pt is on appropriate dosage of Eliquis 5mg  BID, but will have birthday [birth year] and will turn 79, then will need Eliquis dosage reduction.  Attempted to call pt, LMOM TCB overdue for labwork.

## 2019-06-15 NOTE — Telephone Encounter (Signed)
Spoke with Lattie Haw with [facility] and she was able to fax over recent labs on the pt.  Eliquis 5mg  refill request received. Patient is 79 years old and will be 79 years old on 06/25/2019, weight-90.7kg, Crea-1.34 on 06/06/2019 and 1.20 on 05/17/2018 from [facility] lab report, Diagnosis-Afib, and last seen by Dr. Lovena Le on 04/26/2019. Dose is appropriate based on dosing criteria. Will send in refill to requested pharmacy.

## 2019-07-20 ENCOUNTER — Ambulatory Visit (INDEPENDENT_AMBULATORY_CARE_PROVIDER_SITE_OTHER): Payer: Medicare Other | Admitting: *Deleted

## 2019-07-20 DIAGNOSIS — I5042 Chronic combined systolic (congestive) and diastolic (congestive) heart failure: Secondary | ICD-10-CM

## 2019-07-20 DIAGNOSIS — I440 Atrioventricular block, first degree: Secondary | ICD-10-CM

## 2019-07-20 LAB — CUP PACEART REMOTE DEVICE CHECK
Battery Remaining Longevity: 114 mo
Battery Remaining Percentage: 100 %
Brady Statistic RA Percent Paced: 21 %
Brady Statistic RV Percent Paced: 93 %
Date Time Interrogation Session: 20210519084300
Implantable Lead Implant Date: 20140929
Implantable Lead Implant Date: 20191115
Implantable Lead Implant Date: 20191115
Implantable Lead Location: 753858
Implantable Lead Location: 753859
Implantable Lead Location: 753859
Implantable Lead Model: 4136
Implantable Lead Model: 4677
Implantable Lead Model: 7742
Implantable Lead Serial Number: 29473237
Implantable Lead Serial Number: 801425
Implantable Lead Serial Number: 808106
Implantable Pulse Generator Implant Date: 20191115
Lead Channel Impedance Value: 504 Ohm
Lead Channel Impedance Value: 616 Ohm
Lead Channel Impedance Value: 781 Ohm
Lead Channel Pacing Threshold Amplitude: 0.4 V
Lead Channel Pacing Threshold Amplitude: 0.6 V
Lead Channel Pacing Threshold Amplitude: 0.7 V
Lead Channel Pacing Threshold Pulse Width: 0.4 ms
Lead Channel Pacing Threshold Pulse Width: 0.4 ms
Lead Channel Pacing Threshold Pulse Width: 0.4 ms
Lead Channel Setting Pacing Amplitude: 2 V
Lead Channel Setting Pacing Amplitude: 2.5 V
Lead Channel Setting Pacing Amplitude: 2.5 V
Lead Channel Setting Pacing Pulse Width: 0.4 ms
Lead Channel Setting Pacing Pulse Width: 0.4 ms
Lead Channel Setting Sensing Sensitivity: 2.5 mV
Lead Channel Setting Sensing Sensitivity: 2.5 mV
Pulse Gen Serial Number: 737231

## 2019-07-25 NOTE — Progress Notes (Signed)
Remote pacemaker transmission.   

## 2019-09-07 ENCOUNTER — Other Ambulatory Visit: Payer: Self-pay | Admitting: Internal Medicine

## 2019-10-06 ENCOUNTER — Other Ambulatory Visit: Payer: Self-pay

## 2019-10-06 ENCOUNTER — Ambulatory Visit: Payer: Medicare Other | Admitting: Podiatry

## 2019-10-06 DIAGNOSIS — E119 Type 2 diabetes mellitus without complications: Secondary | ICD-10-CM

## 2019-10-06 DIAGNOSIS — M79675 Pain in left toe(s): Secondary | ICD-10-CM | POA: Diagnosis not present

## 2019-10-06 DIAGNOSIS — B351 Tinea unguium: Secondary | ICD-10-CM

## 2019-10-06 DIAGNOSIS — M79674 Pain in right toe(s): Secondary | ICD-10-CM | POA: Diagnosis not present

## 2019-10-06 DIAGNOSIS — L602 Onychogryphosis: Secondary | ICD-10-CM | POA: Diagnosis not present

## 2019-10-06 NOTE — Progress Notes (Signed)
  Subjective:  Patient ID: Ricky Barnett, male    DOB: May 18, 1940,  MRN: [id number]  Chief Complaint  Patient presents with  . Diabetes    A1C 5.9, diabetic foot exam, patient is taking Eliquis  . Nail Problem    thick painful toenails    79 y.o. male presents with the above complaint. History confirmed with patient.  He has type 2 diabetes which is well controlled  Objective:  Physical Exam: Bilateral feet are warm well perfused, hallux valgus and digital contractures are present.  Onychomycosis of all 10 digits with severe elongation and onychogryphosis Assessment:   1. Onychomycosis   2. Pain due to onychomycosis of toenails of both feet   3. Onychogryphosis   4. Controlled type 2 diabetes mellitus without complication, without long-term current use of insulin ([place])      Plan:  Patient was evaluated and treated and all questions answered.  Discussed the etiology and treatment options for the condition in detail with the patient. Educated patient on the topical and oral treatment options for mycotic nails. Recommended debridement of the nails today. Sharp and mechanical debridement performed of all painful and mycotic nails today. Nails debrided in length and thickness using a nail nipper and a mechanical burr to level of comfort. Discussed treatment options including appropriate shoe gear. Follow up as needed for painful nails.     Return in about 3 months (around 01/06/2020) for diabetic nail trim.   Lanae Crumbly, DPM 10/06/2019

## 2019-10-06 NOTE — Patient Instructions (Signed)
Diabetes Mellitus and Foot Care Foot care is an important part of your health, especially when you have diabetes. Diabetes may cause you to have problems because of poor blood flow (circulation) to your feet and legs, which can cause your skin to:  Become thinner and drier.  Break more easily.  Heal more slowly.  Peel and crack. You may also have nerve damage (neuropathy) in your legs and feet, causing decreased feeling in them. This means that you may not notice minor injuries to your feet that could lead to more serious problems. Noticing and addressing any potential problems early is the best way to prevent future foot problems. How to care for your feet Foot hygiene  Wash your feet daily with warm water and mild soap. Do not use hot water. Then, pat your feet and the areas between your toes until they are completely dry. Do not soak your feet as this can dry your skin.  Trim your toenails straight across. Do not dig under them or around the cuticle. File the edges of your nails with an emery board or nail file.  Apply a moisturizing lotion or petroleum jelly to the skin on your feet and to dry, brittle toenails. Use lotion that does not contain alcohol and is unscented. Do not apply lotion between your toes. Shoes and socks  Wear clean socks or stockings every day. Make sure they are not too tight. Do not wear knee-high stockings since they may decrease blood flow to your legs.  Wear shoes that fit properly and have enough cushioning. Always look in your shoes before you put them on to be sure there are no objects inside.  To break in new shoes, wear them for just a few hours a day. This prevents injuries on your feet. Wounds, scrapes, corns, and calluses  Check your feet daily for blisters, cuts, bruises, sores, and redness. If you cannot see the bottom of your feet, use a mirror or ask someone for help.  Do not cut corns or calluses or try to remove them with medicine.  If you  find a minor scrape, cut, or break in the skin on your feet, keep it and the skin around it clean and dry. You may clean these areas with mild soap and water. Do not clean the area with peroxide, alcohol, or iodine.  If you have a wound, scrape, corn, or callus on your foot, look at it several times a day to make sure it is healing and not infected. Check for: ? Redness, swelling, or pain. ? Fluid or blood. ? Warmth. ? Pus or a bad smell. General instructions  Do not cross your legs. This may decrease blood flow to your feet.  Do not use heating pads or hot water bottles on your feet. They may burn your skin. If you have lost feeling in your feet or legs, you may not know this is happening until it is too late.  Protect your feet from hot and cold by wearing shoes, such as at the beach or on hot pavement.  Schedule a complete foot exam at least once a year (annually) or more often if you have foot problems. If you have foot problems, report any cuts, sores, or bruises to your health care provider immediately. Contact a health care provider if:  You have a medical condition that increases your risk of infection and you have any cuts, sores, or bruises on your feet.  You have an injury that is not   healing.  You have redness on your legs or feet.  You feel burning or tingling in your legs or feet.  You have pain or cramps in your legs and feet.  Your legs or feet are numb.  Your feet always feel cold.  You have pain around a toenail. Get help right away if:  You have a wound, scrape, corn, or callus on your foot and: ? You have pain, swelling, or redness that gets worse. ? You have fluid or blood coming from the wound, scrape, corn, or callus. ? Your wound, scrape, corn, or callus feels warm to the touch. ? You have pus or a bad smell coming from the wound, scrape, corn, or callus. ? You have a fever. ? You have a red line going up your leg. Summary  Check your feet every day  for cuts, sores, red spots, swelling, and blisters.  Moisturize feet and legs daily.  Wear shoes that fit properly and have enough cushioning.  If you have foot problems, report any cuts, sores, or bruises to your health care provider immediately.  Schedule a complete foot exam at least once a year (annually) or more often if you have foot problems. This information is not intended to replace advice given to you by your health care provider. Make sure you discuss any questions you have with your health care provider. Document Revised: 11/10/2018 Document Reviewed: 03/21/2016 Elsevier Patient Education  2020 [employer]  

## 2019-10-19 ENCOUNTER — Ambulatory Visit (INDEPENDENT_AMBULATORY_CARE_PROVIDER_SITE_OTHER): Payer: Medicare Other | Admitting: *Deleted

## 2019-10-19 DIAGNOSIS — I441 Atrioventricular block, second degree: Secondary | ICD-10-CM | POA: Diagnosis not present

## 2019-10-20 LAB — CUP PACEART REMOTE DEVICE CHECK
Battery Remaining Longevity: 108 mo
Battery Remaining Percentage: 100 %
Brady Statistic RA Percent Paced: 22 %
Brady Statistic RV Percent Paced: 93 %
Date Time Interrogation Session: 20210818091100
Implantable Lead Implant Date: 20140929
Implantable Lead Implant Date: 20191115
Implantable Lead Implant Date: 20191115
Implantable Lead Location: 753858
Implantable Lead Location: 753859
Implantable Lead Location: 753859
Implantable Lead Model: 4136
Implantable Lead Model: 4677
Implantable Lead Model: 7742
Implantable Lead Serial Number: 29473237
Implantable Lead Serial Number: 801425
Implantable Lead Serial Number: 808106
Implantable Pulse Generator Implant Date: 20191115
Lead Channel Impedance Value: 572 Ohm
Lead Channel Impedance Value: 620 Ohm
Lead Channel Impedance Value: 777 Ohm
Lead Channel Pacing Threshold Amplitude: 0.4 V
Lead Channel Pacing Threshold Amplitude: 0.6 V
Lead Channel Pacing Threshold Amplitude: 0.7 V
Lead Channel Pacing Threshold Pulse Width: 0.4 ms
Lead Channel Pacing Threshold Pulse Width: 0.4 ms
Lead Channel Pacing Threshold Pulse Width: 0.4 ms
Lead Channel Setting Pacing Amplitude: 2 V
Lead Channel Setting Pacing Amplitude: 2.5 V
Lead Channel Setting Pacing Amplitude: 2.5 V
Lead Channel Setting Pacing Pulse Width: 0.4 ms
Lead Channel Setting Pacing Pulse Width: 0.4 ms
Lead Channel Setting Sensing Sensitivity: 2.5 mV
Lead Channel Setting Sensing Sensitivity: 2.5 mV
Pulse Gen Serial Number: 737231

## 2019-10-20 NOTE — Progress Notes (Signed)
Remote pacemaker transmission.   

## 2019-10-24 DIAGNOSIS — E118 Type 2 diabetes mellitus with unspecified complications: Secondary | ICD-10-CM | POA: Diagnosis not present

## 2019-10-24 DIAGNOSIS — I5042 Chronic combined systolic (congestive) and diastolic (congestive) heart failure: Secondary | ICD-10-CM | POA: Diagnosis not present

## 2019-10-24 DIAGNOSIS — I480 Paroxysmal atrial fibrillation: Secondary | ICD-10-CM | POA: Diagnosis not present

## 2019-10-31 DIAGNOSIS — E782 Mixed hyperlipidemia: Secondary | ICD-10-CM | POA: Diagnosis not present

## 2019-10-31 DIAGNOSIS — I10 Essential (primary) hypertension: Secondary | ICD-10-CM | POA: Diagnosis not present

## 2019-10-31 DIAGNOSIS — E118 Type 2 diabetes mellitus with unspecified complications: Secondary | ICD-10-CM | POA: Diagnosis not present

## 2019-10-31 DIAGNOSIS — M459 Ankylosing spondylitis of unspecified sites in spine: Secondary | ICD-10-CM | POA: Diagnosis not present

## 2019-11-14 IMAGING — CR DG CHEST 1V PORT
2 series · 2 of 2 positions shown · non-contrast
Comparison: Chest radiograph March 22, 2013

CLINICAL DATA: Pacemaker.

EXAM:
PORTABLE CHEST 1 VIEW

[AP (1 of 2)]
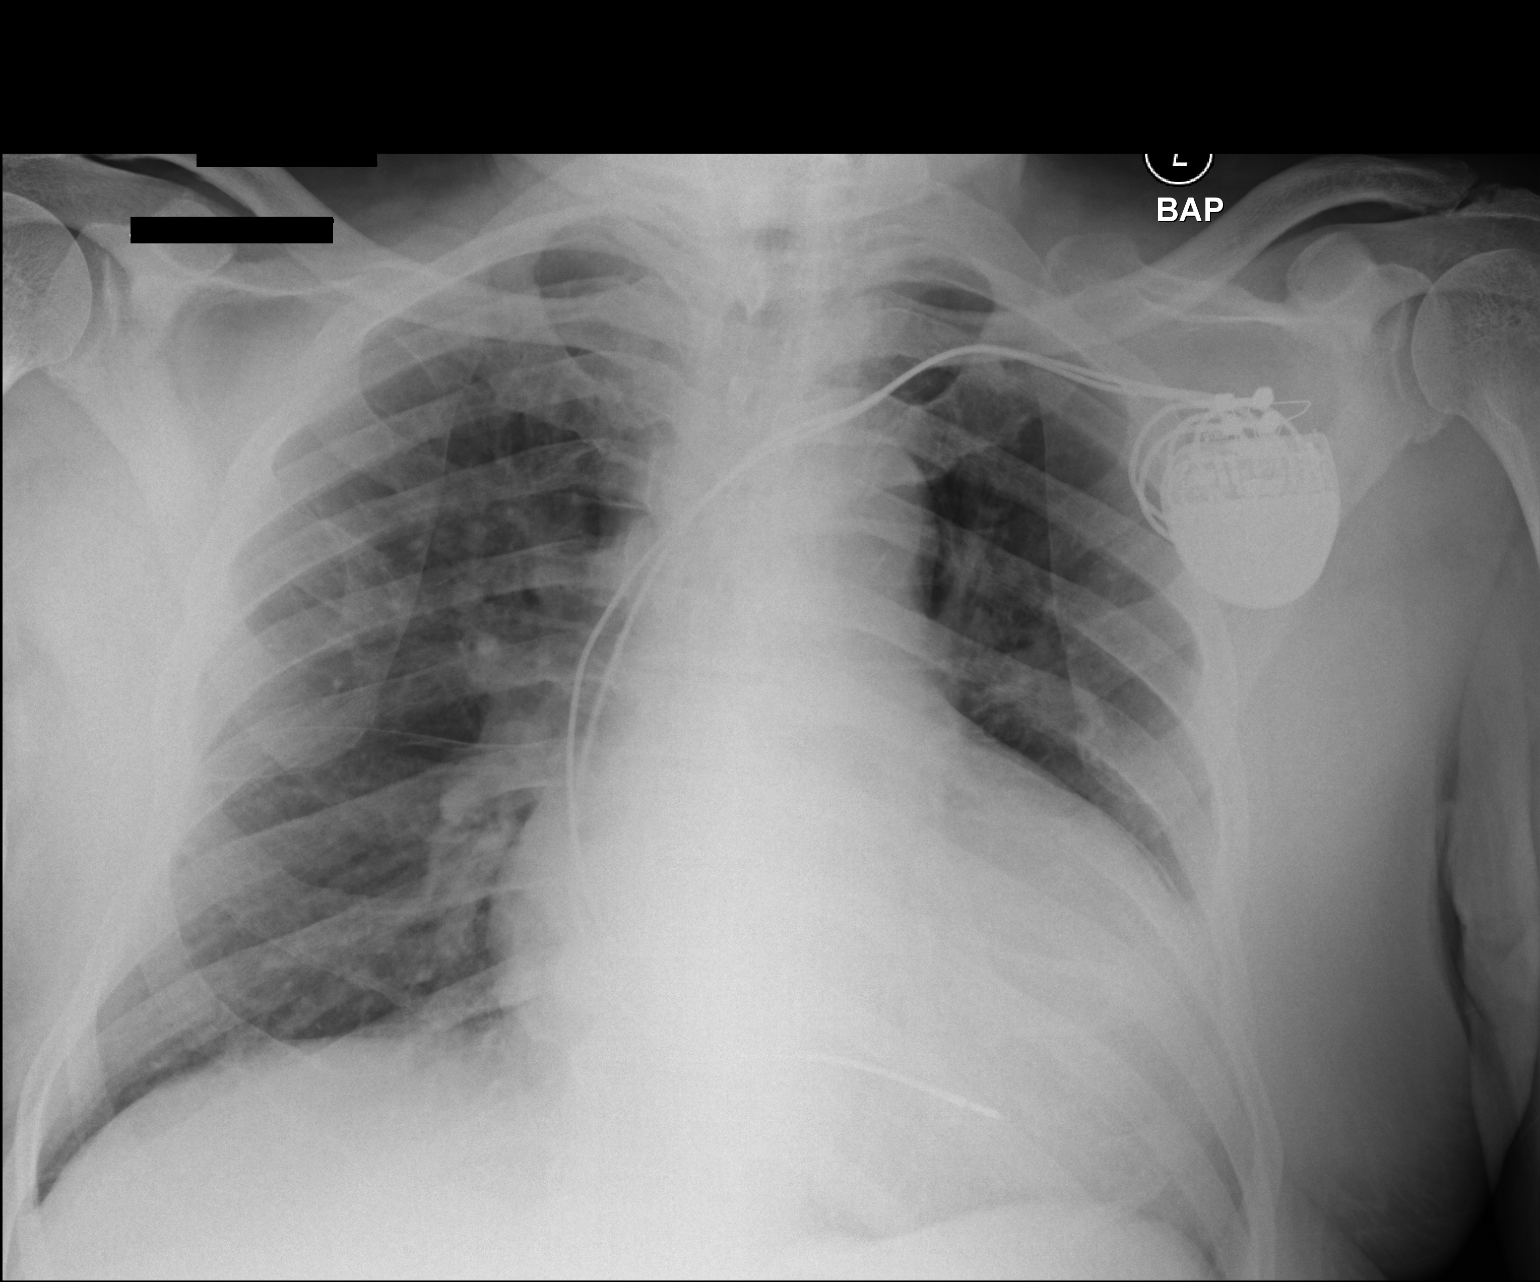

[AP (2 of 2)]
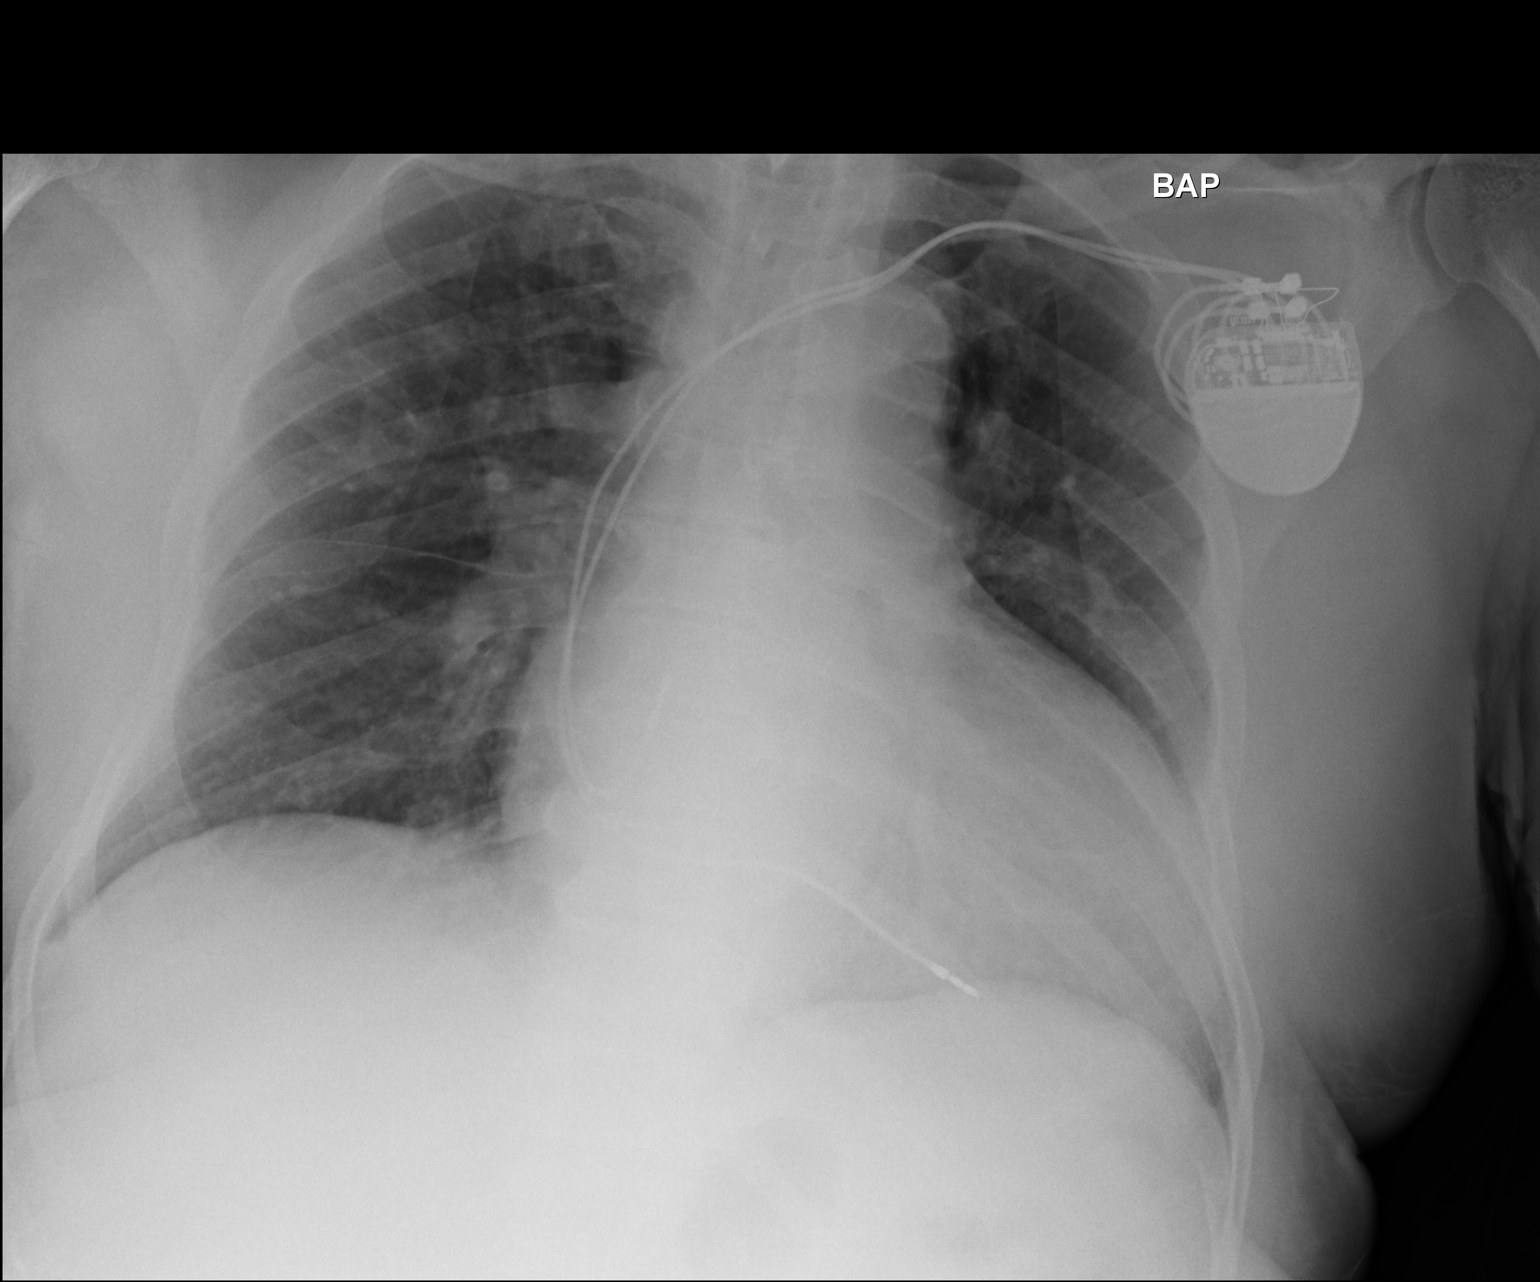

[2 of 2 positions shown; findings below may reference images not displayed]

FINDINGS: Stable appearance of LEFT cardiac pacemaker with lead tips
projecting RIGHT atrium and RIGHT ventricle. Mild calcific
atherosclerosis aortic arch. Mild cardiomegaly. Pulmonary vascular
congestion without pleural effusion or focal consolidation. No
pneumothorax. Soft tissue planes and included osseous structures are
non suspicious.
IMPRESSION: 1. No parent change in cardiac defibrillator.
2. Cardiomegaly and pulmonary vascular congestion.

## 2019-11-17 ENCOUNTER — Other Ambulatory Visit: Payer: Self-pay | Admitting: Internal Medicine

## 2019-11-17 NOTE — Telephone Encounter (Signed)
Pt last saw Dr Lovena Le 04/26/19, last labs 06/06/19 Creat 1.34, age 79, weight 90.7kg, based on specified criteria pt is on the appropriate dosage of Eliquis 5mg  BID.  Will refill rx.

## 2019-11-18 IMAGING — DX DG CHEST 1V
1 series · 1 of 1 positions shown · non-contrast
Comparison: 12/04/2017

CLINICAL DATA: Shortness of breath.

EXAM:
CHEST  1 VIEW

[chest]
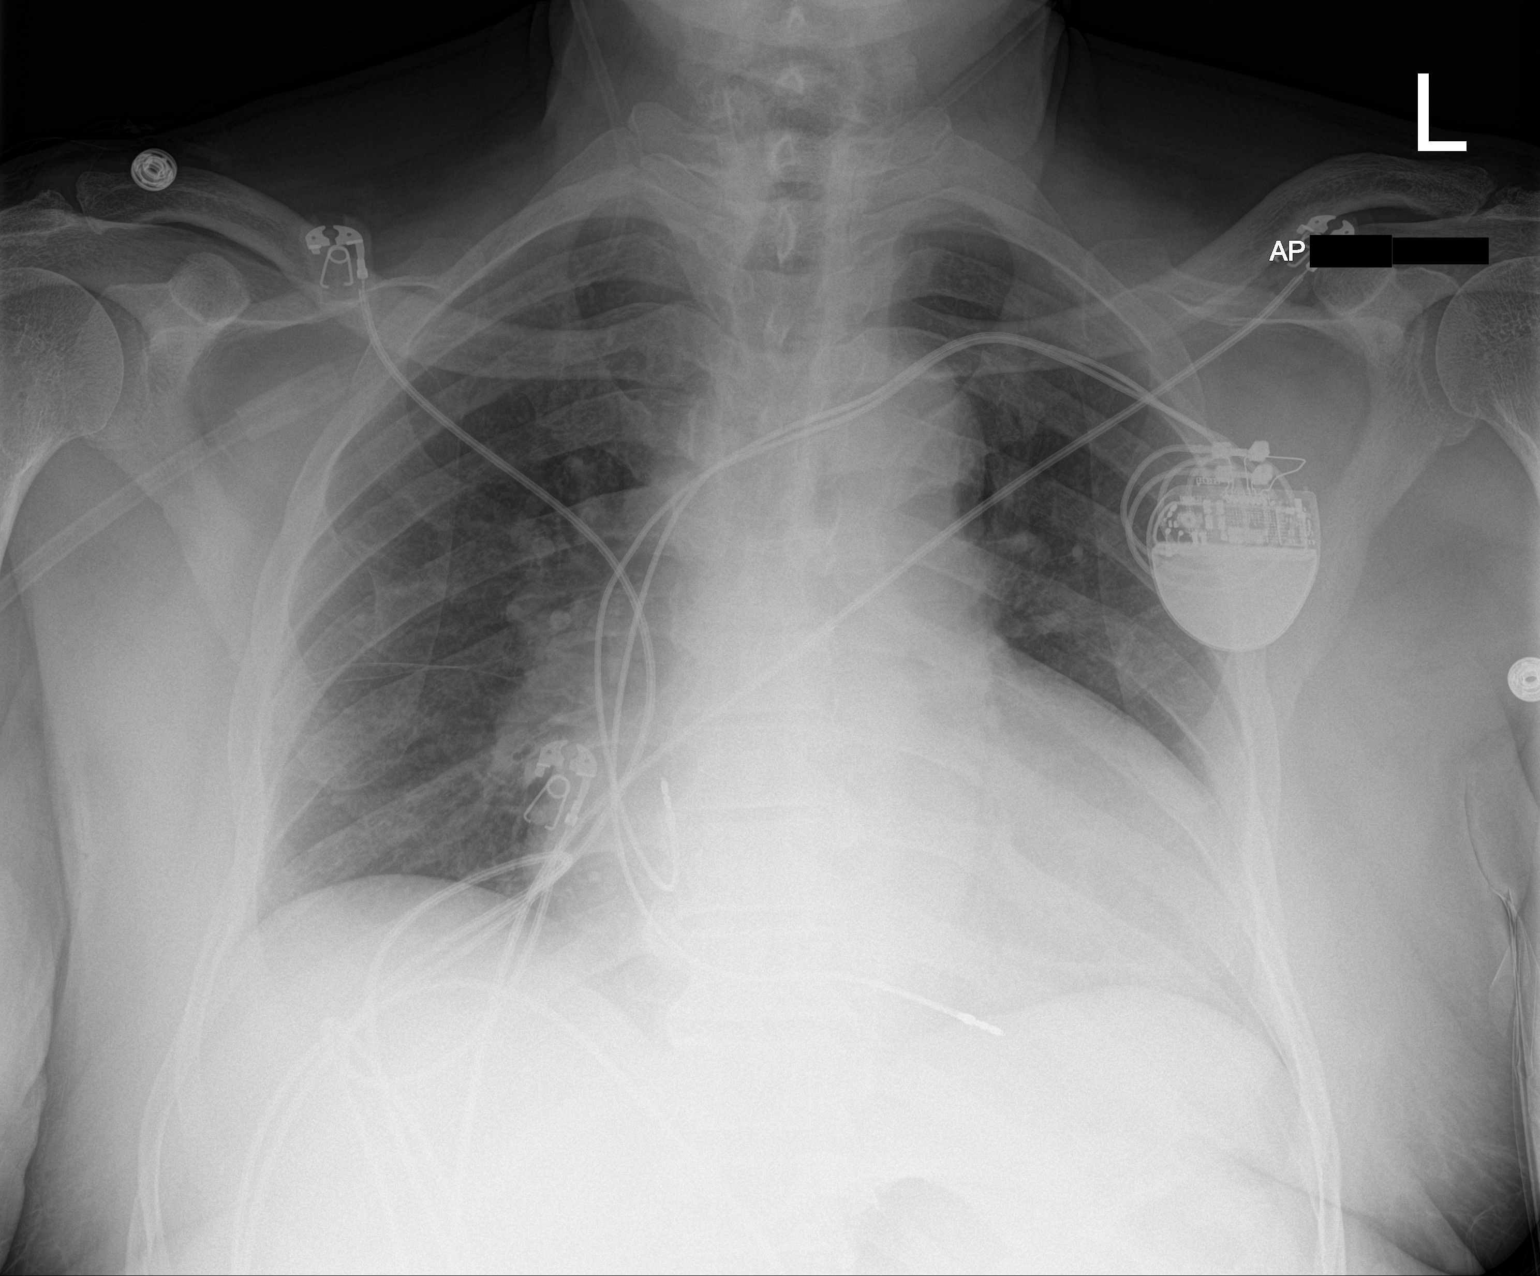

[1 of 1 positions shown; findings below may reference images not displayed]

FINDINGS: Dual-chamber pacer leads from the left are in stable position.
Chronic cardiomegaly and hilar vessel prominence. There is no edema,
consolidation, effusion, or pneumothorax.
IMPRESSION: 1. No evidence of active disease.
2. Cardiomegaly.

## 2019-12-06 ENCOUNTER — Ambulatory Visit: Payer: Medicare Other | Attending: Internal Medicine

## 2019-12-06 DIAGNOSIS — Z23 Encounter for immunization: Secondary | ICD-10-CM

## 2019-12-06 NOTE — Progress Notes (Signed)
   Cov[facility] Vaccination [facility]  Name:  Ricky Barnett    MRN: [id number] DOB: December 14, 1940  12/06/2019  [name] was observed post Covid-19 immunization for 15 minutes without incident. He was provided with Vaccine Information Sheet and instruction to access the V-Safe system.   [name] was instructed to call 911 with any severe reactions post vaccine: Marland Kitchen Difficulty breathing  . Swelling of face and throat  . A fast heartbeat  . A bad rash all over body  . Dizziness and weakness

## 2020-01-06 ENCOUNTER — Ambulatory Visit: Payer: Medicare Other | Admitting: Podiatry

## 2020-01-18 ENCOUNTER — Ambulatory Visit (INDEPENDENT_AMBULATORY_CARE_PROVIDER_SITE_OTHER): Payer: Medicare HMO

## 2020-01-18 DIAGNOSIS — I441 Atrioventricular block, second degree: Secondary | ICD-10-CM

## 2020-01-18 LAB — CUP PACEART REMOTE DEVICE CHECK
Battery Remaining Longevity: 108 mo
Battery Remaining Percentage: 100 %
Brady Statistic RA Percent Paced: 21 %
Brady Statistic RV Percent Paced: 93 %
Date Time Interrogation Session: 20211117092400
Implantable Lead Implant Date: 20140929
Implantable Lead Implant Date: 20191115
Implantable Lead Implant Date: 20191115
Implantable Lead Location: 753858
Implantable Lead Location: 753859
Implantable Lead Location: 753859
Implantable Lead Model: 4136
Implantable Lead Model: 4677
Implantable Lead Model: 7742
Implantable Lead Serial Number: 29473237
Implantable Lead Serial Number: 801425
Implantable Lead Serial Number: 808106
Implantable Pulse Generator Implant Date: 20191115
Lead Channel Impedance Value: 564 Ohm
Lead Channel Impedance Value: 601 Ohm
Lead Channel Impedance Value: 740 Ohm
Lead Channel Pacing Threshold Amplitude: 0.4 V
Lead Channel Pacing Threshold Amplitude: 0.6 V
Lead Channel Pacing Threshold Amplitude: 0.7 V
Lead Channel Pacing Threshold Pulse Width: 0.4 ms
Lead Channel Pacing Threshold Pulse Width: 0.4 ms
Lead Channel Pacing Threshold Pulse Width: 0.4 ms
Lead Channel Setting Pacing Amplitude: 2 V
Lead Channel Setting Pacing Amplitude: 2.5 V
Lead Channel Setting Pacing Amplitude: 2.5 V
Lead Channel Setting Pacing Pulse Width: 0.4 ms
Lead Channel Setting Pacing Pulse Width: 0.4 ms
Lead Channel Setting Sensing Sensitivity: 2.5 mV
Lead Channel Setting Sensing Sensitivity: 2.5 mV
Pulse Gen Serial Number: 737231

## 2020-01-19 NOTE — Progress Notes (Signed)
Remote pacemaker transmission.   

## 2020-04-18 ENCOUNTER — Ambulatory Visit (INDEPENDENT_AMBULATORY_CARE_PROVIDER_SITE_OTHER): Payer: Medicare Other

## 2020-04-18 DIAGNOSIS — I441 Atrioventricular block, second degree: Secondary | ICD-10-CM

## 2020-04-18 LAB — CUP PACEART REMOTE DEVICE CHECK
Battery Remaining Longevity: 102 mo
Battery Remaining Percentage: 100 %
Brady Statistic RA Percent Paced: 21 %
Brady Statistic RV Percent Paced: 93 %
Date Time Interrogation Session: 20220216091200
Implantable Lead Implant Date: 20140929
Implantable Lead Implant Date: 20191115
Implantable Lead Implant Date: 20191115
Implantable Lead Location: 753858
Implantable Lead Location: 753859
Implantable Lead Location: 753859
Implantable Lead Model: 4136
Implantable Lead Model: 4677
Implantable Lead Model: 7742
Implantable Lead Serial Number: 29473237
Implantable Lead Serial Number: 801425
Implantable Lead Serial Number: 808106
Implantable Pulse Generator Implant Date: 20191115
Lead Channel Impedance Value: 571 Ohm
Lead Channel Impedance Value: 601 Ohm
Lead Channel Impedance Value: 810 Ohm
Lead Channel Pacing Threshold Amplitude: 0.4 V
Lead Channel Pacing Threshold Amplitude: 0.6 V
Lead Channel Pacing Threshold Amplitude: 0.7 V
Lead Channel Pacing Threshold Pulse Width: 0.4 ms
Lead Channel Pacing Threshold Pulse Width: 0.4 ms
Lead Channel Pacing Threshold Pulse Width: 0.4 ms
Lead Channel Setting Pacing Amplitude: 2 V
Lead Channel Setting Pacing Amplitude: 2.5 V
Lead Channel Setting Pacing Amplitude: 2.5 V
Lead Channel Setting Pacing Pulse Width: 0.4 ms
Lead Channel Setting Pacing Pulse Width: 0.4 ms
Lead Channel Setting Sensing Sensitivity: 2.5 mV
Lead Channel Setting Sensing Sensitivity: 2.5 mV
Pulse Gen Serial Number: 737231

## 2020-04-19 ENCOUNTER — Other Ambulatory Visit: Payer: Self-pay | Admitting: Internal Medicine

## 2020-04-19 NOTE — Telephone Encounter (Signed)
Prescription refill request for Eliquis received. Indication: Afib Last office visit: Ricky Barnett 04/26/2019 Scr: 1.34, 06/07/2019 Age: 80 yo  Weight: 90.7 kg    Per dosing criteria pt is on the correct dose of Eliquis '5mg'$  BID. Prescription refill sent.

## 2020-04-24 NOTE — Progress Notes (Signed)
Remote pacemaker transmission.   

## 2020-05-04 ENCOUNTER — Other Ambulatory Visit: Payer: Self-pay | Admitting: Internal Medicine

## 2020-06-15 ENCOUNTER — Other Ambulatory Visit: Payer: Self-pay | Admitting: Internal Medicine

## 2020-06-15 MED ORDER — ENTRESTO 97-103 MG PO TABS: 1.0000 | ORAL_TABLET | Freq: Two times a day (BID) | ORAL | 0 refills | Status: DC

## 2020-07-07 ENCOUNTER — Other Ambulatory Visit: Payer: Self-pay | Admitting: Internal Medicine

## 2020-07-11 ENCOUNTER — Other Ambulatory Visit: Payer: Self-pay | Admitting: Internal Medicine

## 2020-07-18 ENCOUNTER — Ambulatory Visit (INDEPENDENT_AMBULATORY_CARE_PROVIDER_SITE_OTHER): Payer: Medicare Other

## 2020-07-18 DIAGNOSIS — I440 Atrioventricular block, first degree: Secondary | ICD-10-CM

## 2020-07-19 LAB — CUP PACEART REMOTE DEVICE CHECK
Battery Remaining Longevity: 102 mo
Battery Remaining Percentage: 100 %
Brady Statistic RA Percent Paced: 21 %
Brady Statistic RV Percent Paced: 93 %
Date Time Interrogation Session: 20220518100300
Implantable Lead Implant Date: 20140929
Implantable Lead Implant Date: 20191115
Implantable Lead Implant Date: 20191115
Implantable Lead Location: 753858
Implantable Lead Location: 753859
Implantable Lead Location: 753859
Implantable Lead Model: 4136
Implantable Lead Model: 4677
Implantable Lead Model: 7742
Implantable Lead Serial Number: 29473237
Implantable Lead Serial Number: 801425
Implantable Lead Serial Number: 808106
Implantable Pulse Generator Implant Date: 20191115
Lead Channel Impedance Value: 566 Ohm
Lead Channel Impedance Value: 582 Ohm
Lead Channel Impedance Value: 762 Ohm
Lead Channel Pacing Threshold Amplitude: 0.5 V
Lead Channel Pacing Threshold Amplitude: 0.6 V
Lead Channel Pacing Threshold Amplitude: 0.6 V
Lead Channel Pacing Threshold Pulse Width: 0.4 ms
Lead Channel Pacing Threshold Pulse Width: 0.4 ms
Lead Channel Pacing Threshold Pulse Width: 0.4 ms
Lead Channel Setting Pacing Amplitude: 2 V
Lead Channel Setting Pacing Amplitude: 2.5 V
Lead Channel Setting Pacing Amplitude: 2.5 V
Lead Channel Setting Pacing Pulse Width: 0.4 ms
Lead Channel Setting Pacing Pulse Width: 0.4 ms
Lead Channel Setting Sensing Sensitivity: 2.5 mV
Lead Channel Setting Sensing Sensitivity: 2.5 mV
Pulse Gen Serial Number: 737231

## 2020-08-06 ENCOUNTER — Other Ambulatory Visit: Payer: Self-pay | Admitting: Internal Medicine

## 2020-08-09 NOTE — Progress Notes (Signed)
Remote pacemaker transmission.   

## 2020-09-05 ENCOUNTER — Other Ambulatory Visit: Payer: Self-pay | Admitting: Internal Medicine

## 2020-09-06 ENCOUNTER — Other Ambulatory Visit: Payer: Self-pay

## 2020-09-21 ENCOUNTER — Encounter: Payer: Medicare Other | Admitting: Internal Medicine

## 2020-10-02 ENCOUNTER — Other Ambulatory Visit: Payer: Self-pay | Admitting: Internal Medicine

## 2020-10-04 ENCOUNTER — Encounter: Payer: Self-pay | Admitting: Internal Medicine

## 2020-10-17 ENCOUNTER — Ambulatory Visit (INDEPENDENT_AMBULATORY_CARE_PROVIDER_SITE_OTHER): Payer: Medicare Other

## 2020-10-17 DIAGNOSIS — I440 Atrioventricular block, first degree: Secondary | ICD-10-CM | POA: Diagnosis not present

## 2020-10-18 LAB — CUP PACEART REMOTE DEVICE CHECK
Battery Remaining Longevity: 102 mo
Battery Remaining Percentage: 100 %
Brady Statistic RA Percent Paced: 21 %
Brady Statistic RV Percent Paced: 92 %
Date Time Interrogation Session: 20220817102800
Implantable Lead Implant Date: 20140929
Implantable Lead Implant Date: 20191115
Implantable Lead Implant Date: 20191115
Implantable Lead Location: 753858
Implantable Lead Location: 753859
Implantable Lead Location: 753859
Implantable Lead Model: 4136
Implantable Lead Model: 4677
Implantable Lead Model: 7742
Implantable Lead Serial Number: 29473237
Implantable Lead Serial Number: 801425
Implantable Lead Serial Number: 808106
Implantable Pulse Generator Implant Date: 20191115
Lead Channel Impedance Value: 511 Ohm
Lead Channel Impedance Value: 521 Ohm
Lead Channel Impedance Value: 684 Ohm
Lead Channel Pacing Threshold Amplitude: 0.4 V
Lead Channel Pacing Threshold Amplitude: 0.6 V
Lead Channel Pacing Threshold Amplitude: 0.6 V
Lead Channel Pacing Threshold Pulse Width: 0.4 ms
Lead Channel Pacing Threshold Pulse Width: 0.4 ms
Lead Channel Pacing Threshold Pulse Width: 0.4 ms
Lead Channel Setting Pacing Amplitude: 2 V
Lead Channel Setting Pacing Amplitude: 2.5 V
Lead Channel Setting Pacing Amplitude: 2.5 V
Lead Channel Setting Pacing Pulse Width: 0.4 ms
Lead Channel Setting Pacing Pulse Width: 0.4 ms
Lead Channel Setting Sensing Sensitivity: 2.5 mV
Lead Channel Setting Sensing Sensitivity: 2.5 mV
Pulse Gen Serial Number: 737231

## 2020-11-05 ENCOUNTER — Other Ambulatory Visit: Payer: Self-pay | Admitting: Internal Medicine

## 2020-11-06 NOTE — Progress Notes (Signed)
Remote pacemaker transmission.   

## 2020-11-14 ENCOUNTER — Other Ambulatory Visit: Payer: Self-pay | Admitting: Internal Medicine

## 2020-11-16 ENCOUNTER — Telehealth: Payer: Self-pay | Admitting: Internal Medicine

## 2020-11-16 MED ORDER — ENTRESTO 97-103 MG PO TABS: 1.0000 | ORAL_TABLET | Freq: Two times a day (BID) | ORAL | 0 refills | Status: DC

## 2020-11-16 NOTE — Telephone Encounter (Signed)
Refill sent to pharmacy.   

## 2020-11-16 NOTE — Telephone Encounter (Signed)
*  STAT* If patient is at the pharmacy, call can be transferred to refill team.   1. Which medications need to be refilled? (please list name of each medication and dose if known) ENTRESTO 97-103 MG  2. Which pharmacy/location (including street and city if local pharmacy) is medication to be sent to? [id number] [place], [place] - [address]  3. Do they need a 30 day or 90 day supply? [address]

## 2020-11-30 ENCOUNTER — Other Ambulatory Visit: Payer: Self-pay | Admitting: Internal Medicine

## 2020-11-30 DIAGNOSIS — I480 Paroxysmal atrial fibrillation: Secondary | ICD-10-CM

## 2020-11-30 NOTE — Telephone Encounter (Addendum)
Eliquis '5mg'$  refill request received. Patient is 80 years old, weight-90.7kg, Crea-1.44 on 10/24/2019 via [facility] from [facility], Diagnosis-Afib, and last seen by Dr. Lovena Le on 04/26/2019 & pending an appt on 02/07/2021. Dose is appropriate based on dosing criteria.   Pt needs updated labs and has a pending appt with Dr. Lovena Le in December 2022. Will call pt's PCP to see any updated have been done before ordering some to be done at December appt.   At 952am, called pt's PCP office & had to leave a message for them to call back in reference to any recent labs or fax to [phone]. Will await.   Labs received from PCP office; there are no updated labs on the pt. Labs ordered to be done at 02/07/2021 appt with Dr. Lovena Le.

## 2020-12-12 ENCOUNTER — Other Ambulatory Visit: Payer: Self-pay | Admitting: Internal Medicine

## 2021-01-16 ENCOUNTER — Ambulatory Visit (INDEPENDENT_AMBULATORY_CARE_PROVIDER_SITE_OTHER): Payer: Medicare Other

## 2021-01-16 DIAGNOSIS — I440 Atrioventricular block, first degree: Secondary | ICD-10-CM | POA: Diagnosis not present

## 2021-01-16 LAB — CUP PACEART REMOTE DEVICE CHECK
Battery Remaining Longevity: 96 mo
Battery Remaining Percentage: 100 %
Brady Statistic RA Percent Paced: 23 %
Brady Statistic RV Percent Paced: 90 %
Date Time Interrogation Session: 20221116121200
Implantable Lead Implant Date: 20140929
Implantable Lead Implant Date: 20191115
Implantable Lead Implant Date: 20191115
Implantable Lead Location: 753858
Implantable Lead Location: 753859
Implantable Lead Location: 753859
Implantable Lead Model: 4136
Implantable Lead Model: 4677
Implantable Lead Model: 7742
Implantable Lead Serial Number: 29473237
Implantable Lead Serial Number: 801425
Implantable Lead Serial Number: 808106
Implantable Pulse Generator Implant Date: 20191115
Lead Channel Impedance Value: 541 Ohm
Lead Channel Impedance Value: 560 Ohm
Lead Channel Impedance Value: 684 Ohm
Lead Channel Pacing Threshold Amplitude: 0.4 V
Lead Channel Pacing Threshold Amplitude: 0.6 V
Lead Channel Pacing Threshold Amplitude: 0.7 V
Lead Channel Pacing Threshold Pulse Width: 0.4 ms
Lead Channel Pacing Threshold Pulse Width: 0.4 ms
Lead Channel Pacing Threshold Pulse Width: 0.4 ms
Lead Channel Setting Pacing Amplitude: 2 V
Lead Channel Setting Pacing Amplitude: 2.5 V
Lead Channel Setting Pacing Amplitude: 2.5 V
Lead Channel Setting Pacing Pulse Width: 0.4 ms
Lead Channel Setting Pacing Pulse Width: 0.4 ms
Lead Channel Setting Sensing Sensitivity: 2.5 mV
Lead Channel Setting Sensing Sensitivity: 2.5 mV
Pulse Gen Serial Number: 737231

## 2021-01-23 NOTE — Progress Notes (Signed)
Remote pacemaker transmission.   

## 2021-01-29 ENCOUNTER — Other Ambulatory Visit: Payer: Self-pay | Admitting: Internal Medicine

## 2021-02-07 ENCOUNTER — Other Ambulatory Visit: Payer: Medicare Other

## 2021-02-07 ENCOUNTER — Encounter: Payer: Medicare Other | Admitting: Internal Medicine

## 2021-02-27 ENCOUNTER — Other Ambulatory Visit: Payer: Self-pay | Admitting: Internal Medicine

## 2021-02-28 ENCOUNTER — Other Ambulatory Visit: Payer: Self-pay | Admitting: Internal Medicine

## 2021-02-28 NOTE — Telephone Encounter (Deleted)
Prescription refill request for Eliquis received. Indication: Afib  Last office visit: 04/26/19 Lovena Le)  Scr: 1.44 (10/24/19)  Age: 80 Weight: 90.7kg   Pt overdue for labs and office visit. Labs were ordered and appt was scheduled for office on 02/07/21; however, pt canceled appt and did not show up for lab work.

## 2021-02-28 NOTE — Telephone Encounter (Signed)
Prescription refill request for Eliquis received. Indication: Afib  Last office visit: 04/26/19 Lovena Le)  Scr: 1.44 (10/24/19)  Age: 80 Weight: 90.7kg     Pt overdue for labs and office visit. Labs were ordered and appt was scheduled for office on 02/07/21; however, pt canceled appt and did not show up for lab work. Called pt, no answer. Unable to leave message.

## 2021-03-01 NOTE — Telephone Encounter (Addendum)
Attempted to call Ricky Barnett no answer. Office is closed on Monday. Will send in so Ricky Barnett does not run out. With note to pharmacy that Ricky Barnett will need updated labs and an OV with cardiologist for future refills.   Ricky Barnett scheduled to see Dr. Lovena Le on 04/04/2021 will schedule Ricky Barnett a lab appointment as well at the same time so Ricky Barnett can get labs.

## 2021-03-05 DIAGNOSIS — I10 Essential (primary) hypertension: Secondary | ICD-10-CM | POA: Diagnosis not present

## 2021-03-05 DIAGNOSIS — R5383 Other fatigue: Secondary | ICD-10-CM | POA: Diagnosis not present

## 2021-03-05 DIAGNOSIS — E118 Type 2 diabetes mellitus with unspecified complications: Secondary | ICD-10-CM | POA: Diagnosis not present

## 2021-03-12 ENCOUNTER — Other Ambulatory Visit: Payer: Self-pay | Admitting: Internal Medicine

## 2021-03-12 DIAGNOSIS — Z0000 Encounter for general adult medical examination without abnormal findings: Secondary | ICD-10-CM | POA: Diagnosis not present

## 2021-03-12 DIAGNOSIS — R634 Abnormal weight loss: Secondary | ICD-10-CM | POA: Diagnosis not present

## 2021-03-12 DIAGNOSIS — I428 Other cardiomyopathies: Secondary | ICD-10-CM | POA: Diagnosis not present

## 2021-03-12 DIAGNOSIS — N1831 Chronic kidney disease, stage 3a: Secondary | ICD-10-CM | POA: Diagnosis not present

## 2021-03-12 DIAGNOSIS — E118 Type 2 diabetes mellitus with unspecified complications: Secondary | ICD-10-CM | POA: Diagnosis not present

## 2021-03-12 DIAGNOSIS — I480 Paroxysmal atrial fibrillation: Secondary | ICD-10-CM | POA: Diagnosis not present

## 2021-03-12 DIAGNOSIS — D6869 Other thrombophilia: Secondary | ICD-10-CM | POA: Diagnosis not present

## 2021-03-12 DIAGNOSIS — D509 Iron deficiency anemia, unspecified: Secondary | ICD-10-CM | POA: Diagnosis not present

## 2021-03-12 DIAGNOSIS — I5042 Chronic combined systolic (congestive) and diastolic (congestive) heart failure: Secondary | ICD-10-CM | POA: Diagnosis not present

## 2021-03-12 DIAGNOSIS — I495 Sick sinus syndrome: Secondary | ICD-10-CM | POA: Diagnosis not present

## 2021-03-12 DIAGNOSIS — D692 Other nonthrombocytopenic purpura: Secondary | ICD-10-CM | POA: Diagnosis not present

## 2021-04-04 ENCOUNTER — Ambulatory Visit: Payer: Medicare Other | Admitting: Internal Medicine

## 2021-04-04 ENCOUNTER — Other Ambulatory Visit: Payer: Medicare Other

## 2021-04-04 ENCOUNTER — Encounter: Payer: Self-pay | Admitting: Internal Medicine

## 2021-04-04 ENCOUNTER — Other Ambulatory Visit: Payer: Self-pay

## 2021-04-04 VITALS — BP 110/62 | HR 76 | Ht 71.0 in | Wt 165.0 lb

## 2021-04-04 DIAGNOSIS — I441 Atrioventricular block, second degree: Secondary | ICD-10-CM | POA: Diagnosis not present

## 2021-04-04 DIAGNOSIS — Z950 Presence of cardiac pacemaker: Secondary | ICD-10-CM | POA: Diagnosis not present

## 2021-04-04 DIAGNOSIS — I480 Paroxysmal atrial fibrillation: Secondary | ICD-10-CM | POA: Diagnosis not present

## 2021-04-04 DIAGNOSIS — I5042 Chronic combined systolic (congestive) and diastolic (congestive) heart failure: Secondary | ICD-10-CM

## 2021-04-04 NOTE — Patient Instructions (Signed)
Medication Instructions:  Your physician recommends that you continue on your current medications as directed. Please refer to the Current Medication list given to you today.  Labwork: None ordered.  Testing/Procedures: None ordered.  Follow-Up: Your physician wants you to follow-up in: one year with [name], MD or one of the following Advanced Practice Providers on your designated Care Team:   Tommye Standard, [place] Legrand Como "Jonni Sanger" Chalmers Cater, [place]  Remote monitoring is used to monitor your Pacemaker from home. This monitoring reduces the number of office visits required to check your device to one time per year. It allows [place] to keep an eye on the functioning of your device to ensure it is working properly. You are scheduled for a device check from home on 04/17/2021. You may send your transmission at any time that day. If you have a wireless device, the transmission will be sent automatically. After your physician reviews your transmission, you will receive a postcard with your next transmission date.  Any Other Special Instructions Will Be Listed Below (If Applicable).  If you need a refill on your cardiac medications before your next appointment, please call your pharmacy.

## 2021-04-04 NOTE — Progress Notes (Signed)
HPI [name] returns today for followup. He is a pleasant 81 yo man with a h/o chronic systolic heart failure, CHB, s/p DDD PM with up grade to a Biv PPM. He has done well in the interim. He has been on maximal medical therapy. He denies anginal symptoms or peripheral edema.  No Known Allergies   Current Outpatient Medications  Medication Sig Dispense Refill   acetaminophen (TYLENOL) 325 MG tablet Take 1-2 tablets (325-650 mg total) by mouth every 4 (four) hours as needed.     ELIQUIS 5 MG TABS tablet TAKE 1 TABLET BY MOUTH TWICE A DAY 180 tablet 0   furosemide (LASIX) 40 MG tablet Take 1 tablet (40 mg total) by mouth daily. 90 tablet 3   KLOR-CON M20 20 MEQ tablet TAKE 1 TABLET BY MOUTH EVERY DAY 90 tablet 3   memantine (NAMENDA) 5 MG tablet Take 5 mg by mouth 2 (two) times daily.     metFORMIN (GLUCOPHAGE-XR) 500 MG 24 hr tablet Take 1,000 mg by mouth 2 (two) times daily.     metFORMIN (GLUCOPHAGE-XR) 750 MG 24 hr tablet Take 1,500 mg by mouth every evening.      metoprolol succinate (TOPROL-XL) 50 MG 24 hr tablet Take 50 mg by mouth daily. Take with or immediately following a meal.      Multiple Vitamin (MULTIVITAMIN) tablet Take 1 tablet by mouth daily.     pravastatin (PRAVACHOL) 40 MG tablet TAKE 1 TABLET BY MOUTH EVERY DAY 90 tablet 0   predniSONE (DELTASONE) 10 MG tablet TAKE 4 TABLETS FOR 3 DAYS, THEN REDUCE BY 1 TABLET EVERY 3 DAYS UNTIL COMPLETED     sacubitril-valsartan (ENTRESTO) 97-103 MG Take 1 tablet by mouth 2 (two) times daily. 60 tablet 1   SHINGRIX injection      Tamsulosin HCl (FLOMAX) 0.4 MG CAPS Take 0.4 mg by mouth daily after supper.     tiZANidine (ZANAFLEX) 4 MG tablet Take 4 mg by mouth 3 (three) times daily as needed.     traMADol (ULTRAM) 50 MG tablet Take 50 mg by mouth every 6 (six) hours as needed for moderate pain or severe pain.     No current facility-administered medications for this visit.     Past Medical History:  Diagnosis Date   Angina     just started recently ,2-3 days ago-went to see Dr. Einar Gip 05/14/2011   Arthritis    bil knees   Chronic kidney disease    Diabetes mellitus    Type II   Dysrhythmia    2-3 yrs ago   Headache(784.0)    occas   Hypertension    Left ventricular dysfunction    Mastoiditis    right ear   Pacemaker 11/29/2012   dual chamber   Palpitations     ROS:   All systems reviewed and negative except as noted in the HPI.   Past Surgical History:  Procedure Laterality Date   BIV UPGRADE N/A 12/04/2017   Procedure: Dual Chamber PPM upgrading to BIV PPM;  Surgeon: Evans Lance, MD;  Location: [facility];  Service: Cardiovascular;  Laterality: N/A;   CYSTECTOMY     under chin [place]     states he cut finger off when cutting wood   INSERT / REPLACE / REMOVE PACEMAKER  11/29/2012   [employer]    NASAL SINUS SURGERY     left side   PACEMAKER LEAD REMOVAL N/A  01/15/2018   Procedure: PACEMAKER LEAD REMOVAL,REMOVE ONE LEAD,OCCLUDED ACCESS,PUT IN BIVENTRICULAR PACEMAKER;  Surgeon: Evans Lance, MD;  Location: [facility];  Service: Cardiovascular;  Laterality: N/A;   PERMANENT PACEMAKER INSERTION N/A 11/29/2012   Procedure: PERMANENT PACEMAKER INSERTION;  Surgeon: Sanda Klein, MD;  Location: [facility];  Service: Cardiovascular;  Laterality: N/A;   TYMPANOPLASTY  05/19/2011   Procedure: TYMPANOPLASTY;  Surgeon: Thornell Sartorius, MD;  Location: [facility];  Service: ENT;  Laterality: Right;  Exploratory Tympanotomy     Family History  Problem Relation Age of Onset   Heart attack Father      Social History   Socioeconomic History   Marital status: Married    Spouse name: Not on file   Number of children: Not on file   Years of education: Not on file   Highest education level: Not on file  Occupational History   Not on file  Tobacco Use   Smoking status: Never   Smokeless tobacco: Never  Vaping Use   Vaping Use: Never used  Substance and Sexual Activity    Alcohol use: No   Drug use: No   Sexual activity: Not on file  Other Topics Concern   Not on file  Social History Narrative   Not on file   Social Determinants of Health   Financial Resource Strain: Not on file  Food Insecurity: Not on file  Transportation Needs: Not on file  Physical Activity: Not on file  Stress: Not on file  Social Connections: Not on file  Intimate Partner Violence: Not on file     BP 110/62    Pulse 76    Ht 5\' 11"  (1.803 m)    Wt 165 lb (74.8 kg)    BMI 23.01 kg/m   Physical Exam:  Well appearing NAD HEENT: Unremarkable Neck:  No JVD, no thyromegally Lymphatics:  No adenopathy Back:  No CVA tenderness Lungs:  Clear, with no wheezes, rales, or rhonchi HEART:  Regular rate rhythm, no murmurs, no rubs, no clicks Abd:  soft, positive bowel sounds, no organomegally, no rebound, no guarding Ext:  2 plus pulses, no edema, no cyanosis, no clubbing Skin:  No rashes no nodules Neuro:  CN II through XII intact, motor grossly intact  EKG -normal sinus rhythm with biventricular pacing  DEVICE  Normal device function.  See PaceArt for details.   Assess/Plan:  1. CHB - he is asymptomatic, s/p PPM insertion. 2. PPM - his [employer] biv PPM is working normally. We will recheck in several months. 3. Chronic systolic heart failure - his symptoms remain class 2. He will continue his current meds. He will maintain a low sodium diet. 4. Dyslipidemia - he will continue statin therapy.   Mikle Bosworth.D.

## 2021-04-05 LAB — BASIC METABOLIC PANEL
BUN/Creatinine Ratio: 11 (ref 10–24)
BUN: 23 mg/dL (ref 8–27)
CO2: 27 mmol/L (ref 20–29)
Calcium: 9.2 mg/dL (ref 8.6–10.2)
Chloride: 101 mmol/L (ref 96–106)
Creatinine, Ser: 2.18 mg/dL — ABNORMAL HIGH (ref 0.76–1.27)
Glucose: 76 mg/dL (ref 70–99)
Potassium: 5.5 mmol/L — ABNORMAL HIGH (ref 3.5–5.2)
Sodium: 140 mmol/L (ref 134–144)
eGFR: 30 mL/min/{1.73_m2} — ABNORMAL LOW (ref >59)

## 2021-04-05 LAB — CBC
Hematocrit: 32.5 % — ABNORMAL LOW (ref 37.5–51.0)
Hemoglobin: 10.2 g/dL — ABNORMAL LOW (ref 13.0–17.7)
MCH: 25.3 pg — ABNORMAL LOW (ref 26.6–33.0)
MCHC: 31.4 g/dL — ABNORMAL LOW (ref 31.5–35.7)
MCV: 81 fL (ref 79–97)
Platelets: 406 10*3/uL (ref 150–450)
RBC: 4.03 x10E6/uL — ABNORMAL LOW (ref 4.14–5.80)
RDW: 13.6 % (ref 11.6–15.4)
WBC: 6.4 10*3/uL (ref 3.4–10.8)

## 2021-04-09 ENCOUNTER — Other Ambulatory Visit: Payer: Medicare Other

## 2021-04-15 LAB — CUP PACEART INCLINIC DEVICE CHECK
Date Time Interrogation Session: 20230202000000
Implantable Lead Implant Date: 20140929
Implantable Lead Implant Date: 20191115
Implantable Lead Implant Date: 20191115
Implantable Lead Location: 753858
Implantable Lead Location: 753859
Implantable Lead Location: 753859
Implantable Lead Model: 4136
Implantable Lead Model: 4677
Implantable Lead Model: 7742
Implantable Lead Serial Number: 29473237
Implantable Lead Serial Number: 801425
Implantable Lead Serial Number: 808106
Implantable Pulse Generator Implant Date: 20191115
Lead Channel Impedance Value: 443 Ohm
Lead Channel Impedance Value: 538 Ohm
Lead Channel Impedance Value: 732 Ohm
Lead Channel Pacing Threshold Amplitude: 0.5 V
Lead Channel Pacing Threshold Amplitude: 0.6 V
Lead Channel Pacing Threshold Amplitude: 0.6 V
Lead Channel Pacing Threshold Pulse Width: 0.4 ms
Lead Channel Pacing Threshold Pulse Width: 0.4 ms
Lead Channel Pacing Threshold Pulse Width: 0.4 ms
Lead Channel Sensing Intrinsic Amplitude: 20.9 mV
Lead Channel Sensing Intrinsic Amplitude: 25 mV
Lead Channel Sensing Intrinsic Amplitude: 3.2 mV
Lead Channel Setting Pacing Amplitude: 2 V
Lead Channel Setting Pacing Amplitude: 2.5 V
Lead Channel Setting Pacing Amplitude: 2.5 V
Lead Channel Setting Pacing Pulse Width: 0.4 ms
Lead Channel Setting Pacing Pulse Width: 0.4 ms
Lead Channel Setting Sensing Sensitivity: 2.5 mV
Lead Channel Setting Sensing Sensitivity: 2.5 mV
Pulse Gen Serial Number: 737231

## 2021-04-17 ENCOUNTER — Ambulatory Visit (INDEPENDENT_AMBULATORY_CARE_PROVIDER_SITE_OTHER): Payer: Medicare Other

## 2021-04-17 DIAGNOSIS — I441 Atrioventricular block, second degree: Secondary | ICD-10-CM

## 2021-04-17 LAB — CUP PACEART REMOTE DEVICE CHECK
Battery Remaining Longevity: 90 mo
Battery Remaining Percentage: 97 %
Brady Statistic RA Percent Paced: 37 %
Brady Statistic RV Percent Paced: 66 %
Date Time Interrogation Session: 20230215112000
Implantable Lead Implant Date: 20140929
Implantable Lead Implant Date: 20191115
Implantable Lead Implant Date: 20191115
Implantable Lead Location: 753858
Implantable Lead Location: 753859
Implantable Lead Location: 753859
Implantable Lead Model: 4136
Implantable Lead Model: 4677
Implantable Lead Model: 7742
Implantable Lead Serial Number: 29473237
Implantable Lead Serial Number: 801425
Implantable Lead Serial Number: 808106
Implantable Pulse Generator Implant Date: 20191115
Lead Channel Impedance Value: 441 Ohm
Lead Channel Impedance Value: 483 Ohm
Lead Channel Impedance Value: 681 Ohm
Lead Channel Pacing Threshold Amplitude: 0.4 V
Lead Channel Pacing Threshold Amplitude: 0.6 V
Lead Channel Pacing Threshold Amplitude: 0.7 V
Lead Channel Pacing Threshold Pulse Width: 0.4 ms
Lead Channel Pacing Threshold Pulse Width: 0.4 ms
Lead Channel Pacing Threshold Pulse Width: 0.4 ms
Lead Channel Setting Pacing Amplitude: 2 V
Lead Channel Setting Pacing Amplitude: 2.5 V
Lead Channel Setting Pacing Amplitude: 2.5 V
Lead Channel Setting Pacing Pulse Width: 0.4 ms
Lead Channel Setting Pacing Pulse Width: 0.4 ms
Lead Channel Setting Sensing Sensitivity: 2.5 mV
Lead Channel Setting Sensing Sensitivity: 2.5 mV
Pulse Gen Serial Number: 737231

## 2021-04-19 ENCOUNTER — Telehealth: Payer: Self-pay

## 2021-04-19 DIAGNOSIS — I5042 Chronic combined systolic (congestive) and diastolic (congestive) heart failure: Secondary | ICD-10-CM

## 2021-04-19 NOTE — Telephone Encounter (Signed)
Outreach made to Pt's wife.  Advised to stop Lasix and potassium.  He will come for lab work March 17.  Advised to call the office if they notice any swelling or sob.

## 2021-04-19 NOTE — Telephone Encounter (Signed)
-----   Message from Evans Lance, MD sent at 04/18/2021 10:16 PM EST ----- Stop potassium supp, and stop lasix, and recheck bmp in a month

## 2021-04-22 NOTE — Progress Notes (Signed)
Remote pacemaker transmission.   

## 2021-05-06 ENCOUNTER — Inpatient Hospital Stay: Admission: RE | Admit: 2021-05-06 | Payer: Medicare Other | Source: Ambulatory Visit

## 2021-05-15 ENCOUNTER — Other Ambulatory Visit: Payer: Self-pay | Admitting: Internal Medicine

## 2021-05-17 ENCOUNTER — Other Ambulatory Visit: Payer: Medicare Other

## 2021-05-27 ENCOUNTER — Other Ambulatory Visit: Payer: Medicare Other

## 2021-06-07 ENCOUNTER — Other Ambulatory Visit: Payer: Medicare Other

## 2021-06-12 ENCOUNTER — Other Ambulatory Visit: Payer: Medicare Other

## 2021-06-12 DIAGNOSIS — I480 Paroxysmal atrial fibrillation: Secondary | ICD-10-CM | POA: Diagnosis not present

## 2021-06-12 DIAGNOSIS — N1831 Chronic kidney disease, stage 3a: Secondary | ICD-10-CM | POA: Diagnosis not present

## 2021-06-12 DIAGNOSIS — E782 Mixed hyperlipidemia: Secondary | ICD-10-CM | POA: Diagnosis not present

## 2021-06-12 DIAGNOSIS — I10 Essential (primary) hypertension: Secondary | ICD-10-CM | POA: Diagnosis not present

## 2021-06-12 DIAGNOSIS — E118 Type 2 diabetes mellitus with unspecified complications: Secondary | ICD-10-CM | POA: Diagnosis not present

## 2021-06-12 DIAGNOSIS — M459 Ankylosing spondylitis of unspecified sites in spine: Secondary | ICD-10-CM | POA: Diagnosis not present

## 2021-06-12 DIAGNOSIS — I5042 Chronic combined systolic (congestive) and diastolic (congestive) heart failure: Secondary | ICD-10-CM | POA: Diagnosis not present

## 2021-06-12 DIAGNOSIS — I495 Sick sinus syndrome: Secondary | ICD-10-CM | POA: Diagnosis not present

## 2021-06-12 DIAGNOSIS — G309 Alzheimer's disease, unspecified: Secondary | ICD-10-CM | POA: Diagnosis not present

## 2021-06-12 DIAGNOSIS — I428 Other cardiomyopathies: Secondary | ICD-10-CM | POA: Diagnosis not present

## 2021-06-24 DIAGNOSIS — I480 Paroxysmal atrial fibrillation: Secondary | ICD-10-CM | POA: Diagnosis not present

## 2021-06-24 DIAGNOSIS — I5042 Chronic combined systolic (congestive) and diastolic (congestive) heart failure: Secondary | ICD-10-CM | POA: Diagnosis not present

## 2021-06-24 DIAGNOSIS — I495 Sick sinus syndrome: Secondary | ICD-10-CM | POA: Diagnosis not present

## 2021-06-24 DIAGNOSIS — Z9181 History of falling: Secondary | ICD-10-CM | POA: Diagnosis not present

## 2021-06-24 DIAGNOSIS — I130 Hypertensive heart and chronic kidney disease with heart failure and stage 1 through stage 4 chronic kidney disease, or unspecified chronic kidney disease: Secondary | ICD-10-CM | POA: Diagnosis not present

## 2021-06-24 DIAGNOSIS — Z7901 Long term (current) use of anticoagulants: Secondary | ICD-10-CM | POA: Diagnosis not present

## 2021-06-24 DIAGNOSIS — M459 Ankylosing spondylitis of unspecified sites in spine: Secondary | ICD-10-CM | POA: Diagnosis not present

## 2021-06-24 DIAGNOSIS — G309 Alzheimer's disease, unspecified: Secondary | ICD-10-CM | POA: Diagnosis not present

## 2021-06-24 DIAGNOSIS — E782 Mixed hyperlipidemia: Secondary | ICD-10-CM | POA: Diagnosis not present

## 2021-06-24 DIAGNOSIS — E1122 Type 2 diabetes mellitus with diabetic chronic kidney disease: Secondary | ICD-10-CM | POA: Diagnosis not present

## 2021-06-24 DIAGNOSIS — Z950 Presence of cardiac pacemaker: Secondary | ICD-10-CM | POA: Diagnosis not present

## 2021-06-24 DIAGNOSIS — Z7984 Long term (current) use of oral hypoglycemic drugs: Secondary | ICD-10-CM | POA: Diagnosis not present

## 2021-06-24 DIAGNOSIS — N1831 Chronic kidney disease, stage 3a: Secondary | ICD-10-CM | POA: Diagnosis not present

## 2021-06-24 DIAGNOSIS — M159 Polyosteoarthritis, unspecified: Secondary | ICD-10-CM | POA: Diagnosis not present

## 2021-06-26 ENCOUNTER — Other Ambulatory Visit: Payer: Self-pay | Admitting: Internal Medicine

## 2021-06-26 DIAGNOSIS — M459 Ankylosing spondylitis of unspecified sites in spine: Secondary | ICD-10-CM | POA: Diagnosis not present

## 2021-06-26 DIAGNOSIS — I130 Hypertensive heart and chronic kidney disease with heart failure and stage 1 through stage 4 chronic kidney disease, or unspecified chronic kidney disease: Secondary | ICD-10-CM | POA: Diagnosis not present

## 2021-06-26 DIAGNOSIS — I480 Paroxysmal atrial fibrillation: Secondary | ICD-10-CM

## 2021-06-26 DIAGNOSIS — G309 Alzheimer's disease, unspecified: Secondary | ICD-10-CM | POA: Diagnosis not present

## 2021-06-26 DIAGNOSIS — I495 Sick sinus syndrome: Secondary | ICD-10-CM | POA: Diagnosis not present

## 2021-06-26 DIAGNOSIS — Z9181 History of falling: Secondary | ICD-10-CM | POA: Diagnosis not present

## 2021-06-26 DIAGNOSIS — Z7984 Long term (current) use of oral hypoglycemic drugs: Secondary | ICD-10-CM | POA: Diagnosis not present

## 2021-06-26 DIAGNOSIS — Z950 Presence of cardiac pacemaker: Secondary | ICD-10-CM | POA: Diagnosis not present

## 2021-06-26 DIAGNOSIS — M159 Polyosteoarthritis, unspecified: Secondary | ICD-10-CM | POA: Diagnosis not present

## 2021-06-26 DIAGNOSIS — E1122 Type 2 diabetes mellitus with diabetic chronic kidney disease: Secondary | ICD-10-CM | POA: Diagnosis not present

## 2021-06-26 DIAGNOSIS — E782 Mixed hyperlipidemia: Secondary | ICD-10-CM | POA: Diagnosis not present

## 2021-06-26 DIAGNOSIS — N1831 Chronic kidney disease, stage 3a: Secondary | ICD-10-CM | POA: Diagnosis not present

## 2021-06-26 DIAGNOSIS — Z7901 Long term (current) use of anticoagulants: Secondary | ICD-10-CM | POA: Diagnosis not present

## 2021-06-26 DIAGNOSIS — I5042 Chronic combined systolic (congestive) and diastolic (congestive) heart failure: Secondary | ICD-10-CM | POA: Diagnosis not present

## 2021-06-26 NOTE — Telephone Encounter (Signed)
Prescription refill request for Eliquis received. ?Indication:Afib  ?Last office visit:04/04/21 Lovena Le) ?Scr: 2.18 (04/04/21) ?Age: 81 ?Weight: 74.8kg ? ?Per Fuller Canada, PharmD, dose should be decreased to 2.5mg  BID. Called pt, spoke with pt's spouse. Made aware of dose change, verbalized understanding. Appropriate dose and refill sent to requested pharmacy.  ? ? ? ?

## 2021-07-03 DIAGNOSIS — M159 Polyosteoarthritis, unspecified: Secondary | ICD-10-CM | POA: Diagnosis not present

## 2021-07-03 DIAGNOSIS — I5042 Chronic combined systolic (congestive) and diastolic (congestive) heart failure: Secondary | ICD-10-CM | POA: Diagnosis not present

## 2021-07-03 DIAGNOSIS — M459 Ankylosing spondylitis of unspecified sites in spine: Secondary | ICD-10-CM | POA: Diagnosis not present

## 2021-07-03 DIAGNOSIS — Z7901 Long term (current) use of anticoagulants: Secondary | ICD-10-CM | POA: Diagnosis not present

## 2021-07-03 DIAGNOSIS — Z7984 Long term (current) use of oral hypoglycemic drugs: Secondary | ICD-10-CM | POA: Diagnosis not present

## 2021-07-03 DIAGNOSIS — G309 Alzheimer's disease, unspecified: Secondary | ICD-10-CM | POA: Diagnosis not present

## 2021-07-03 DIAGNOSIS — N1831 Chronic kidney disease, stage 3a: Secondary | ICD-10-CM | POA: Diagnosis not present

## 2021-07-03 DIAGNOSIS — I495 Sick sinus syndrome: Secondary | ICD-10-CM | POA: Diagnosis not present

## 2021-07-03 DIAGNOSIS — I130 Hypertensive heart and chronic kidney disease with heart failure and stage 1 through stage 4 chronic kidney disease, or unspecified chronic kidney disease: Secondary | ICD-10-CM | POA: Diagnosis not present

## 2021-07-03 DIAGNOSIS — E782 Mixed hyperlipidemia: Secondary | ICD-10-CM | POA: Diagnosis not present

## 2021-07-03 DIAGNOSIS — E1122 Type 2 diabetes mellitus with diabetic chronic kidney disease: Secondary | ICD-10-CM | POA: Diagnosis not present

## 2021-07-03 DIAGNOSIS — Z950 Presence of cardiac pacemaker: Secondary | ICD-10-CM | POA: Diagnosis not present

## 2021-07-03 DIAGNOSIS — I480 Paroxysmal atrial fibrillation: Secondary | ICD-10-CM | POA: Diagnosis not present

## 2021-07-03 DIAGNOSIS — Z9181 History of falling: Secondary | ICD-10-CM | POA: Diagnosis not present

## 2021-07-10 DIAGNOSIS — I495 Sick sinus syndrome: Secondary | ICD-10-CM | POA: Diagnosis not present

## 2021-07-10 DIAGNOSIS — I130 Hypertensive heart and chronic kidney disease with heart failure and stage 1 through stage 4 chronic kidney disease, or unspecified chronic kidney disease: Secondary | ICD-10-CM | POA: Diagnosis not present

## 2021-07-10 DIAGNOSIS — N1831 Chronic kidney disease, stage 3a: Secondary | ICD-10-CM | POA: Diagnosis not present

## 2021-07-10 DIAGNOSIS — E1122 Type 2 diabetes mellitus with diabetic chronic kidney disease: Secondary | ICD-10-CM | POA: Diagnosis not present

## 2021-07-10 DIAGNOSIS — M459 Ankylosing spondylitis of unspecified sites in spine: Secondary | ICD-10-CM | POA: Diagnosis not present

## 2021-07-10 DIAGNOSIS — M159 Polyosteoarthritis, unspecified: Secondary | ICD-10-CM | POA: Diagnosis not present

## 2021-07-10 DIAGNOSIS — I480 Paroxysmal atrial fibrillation: Secondary | ICD-10-CM | POA: Diagnosis not present

## 2021-07-10 DIAGNOSIS — E782 Mixed hyperlipidemia: Secondary | ICD-10-CM | POA: Diagnosis not present

## 2021-07-10 DIAGNOSIS — I5042 Chronic combined systolic (congestive) and diastolic (congestive) heart failure: Secondary | ICD-10-CM | POA: Diagnosis not present

## 2021-07-10 DIAGNOSIS — Z7984 Long term (current) use of oral hypoglycemic drugs: Secondary | ICD-10-CM | POA: Diagnosis not present

## 2021-07-10 DIAGNOSIS — Z950 Presence of cardiac pacemaker: Secondary | ICD-10-CM | POA: Diagnosis not present

## 2021-07-10 DIAGNOSIS — Z9181 History of falling: Secondary | ICD-10-CM | POA: Diagnosis not present

## 2021-07-10 DIAGNOSIS — Z7901 Long term (current) use of anticoagulants: Secondary | ICD-10-CM | POA: Diagnosis not present

## 2021-07-10 DIAGNOSIS — G309 Alzheimer's disease, unspecified: Secondary | ICD-10-CM | POA: Diagnosis not present

## 2021-07-11 DIAGNOSIS — I495 Sick sinus syndrome: Secondary | ICD-10-CM | POA: Diagnosis not present

## 2021-07-11 DIAGNOSIS — Z7984 Long term (current) use of oral hypoglycemic drugs: Secondary | ICD-10-CM | POA: Diagnosis not present

## 2021-07-11 DIAGNOSIS — E782 Mixed hyperlipidemia: Secondary | ICD-10-CM | POA: Diagnosis not present

## 2021-07-11 DIAGNOSIS — I130 Hypertensive heart and chronic kidney disease with heart failure and stage 1 through stage 4 chronic kidney disease, or unspecified chronic kidney disease: Secondary | ICD-10-CM | POA: Diagnosis not present

## 2021-07-11 DIAGNOSIS — M459 Ankylosing spondylitis of unspecified sites in spine: Secondary | ICD-10-CM | POA: Diagnosis not present

## 2021-07-11 DIAGNOSIS — E1122 Type 2 diabetes mellitus with diabetic chronic kidney disease: Secondary | ICD-10-CM | POA: Diagnosis not present

## 2021-07-11 DIAGNOSIS — Z9181 History of falling: Secondary | ICD-10-CM | POA: Diagnosis not present

## 2021-07-11 DIAGNOSIS — M159 Polyosteoarthritis, unspecified: Secondary | ICD-10-CM | POA: Diagnosis not present

## 2021-07-11 DIAGNOSIS — I480 Paroxysmal atrial fibrillation: Secondary | ICD-10-CM | POA: Diagnosis not present

## 2021-07-11 DIAGNOSIS — N1831 Chronic kidney disease, stage 3a: Secondary | ICD-10-CM | POA: Diagnosis not present

## 2021-07-11 DIAGNOSIS — G309 Alzheimer's disease, unspecified: Secondary | ICD-10-CM | POA: Diagnosis not present

## 2021-07-11 DIAGNOSIS — Z950 Presence of cardiac pacemaker: Secondary | ICD-10-CM | POA: Diagnosis not present

## 2021-07-11 DIAGNOSIS — Z7901 Long term (current) use of anticoagulants: Secondary | ICD-10-CM | POA: Diagnosis not present

## 2021-07-11 DIAGNOSIS — I5042 Chronic combined systolic (congestive) and diastolic (congestive) heart failure: Secondary | ICD-10-CM | POA: Diagnosis not present

## 2021-07-17 ENCOUNTER — Ambulatory Visit (INDEPENDENT_AMBULATORY_CARE_PROVIDER_SITE_OTHER): Payer: Medicare Other

## 2021-07-17 DIAGNOSIS — Z7984 Long term (current) use of oral hypoglycemic drugs: Secondary | ICD-10-CM | POA: Diagnosis not present

## 2021-07-17 DIAGNOSIS — E1122 Type 2 diabetes mellitus with diabetic chronic kidney disease: Secondary | ICD-10-CM | POA: Diagnosis not present

## 2021-07-17 DIAGNOSIS — M159 Polyosteoarthritis, unspecified: Secondary | ICD-10-CM | POA: Diagnosis not present

## 2021-07-17 DIAGNOSIS — E782 Mixed hyperlipidemia: Secondary | ICD-10-CM | POA: Diagnosis not present

## 2021-07-17 DIAGNOSIS — I441 Atrioventricular block, second degree: Secondary | ICD-10-CM | POA: Diagnosis not present

## 2021-07-17 DIAGNOSIS — I495 Sick sinus syndrome: Secondary | ICD-10-CM | POA: Diagnosis not present

## 2021-07-17 DIAGNOSIS — N1831 Chronic kidney disease, stage 3a: Secondary | ICD-10-CM | POA: Diagnosis not present

## 2021-07-17 DIAGNOSIS — M459 Ankylosing spondylitis of unspecified sites in spine: Secondary | ICD-10-CM | POA: Diagnosis not present

## 2021-07-17 DIAGNOSIS — G309 Alzheimer's disease, unspecified: Secondary | ICD-10-CM | POA: Diagnosis not present

## 2021-07-17 DIAGNOSIS — I5042 Chronic combined systolic (congestive) and diastolic (congestive) heart failure: Secondary | ICD-10-CM | POA: Diagnosis not present

## 2021-07-17 DIAGNOSIS — I480 Paroxysmal atrial fibrillation: Secondary | ICD-10-CM | POA: Diagnosis not present

## 2021-07-17 DIAGNOSIS — Z7901 Long term (current) use of anticoagulants: Secondary | ICD-10-CM | POA: Diagnosis not present

## 2021-07-17 DIAGNOSIS — Z950 Presence of cardiac pacemaker: Secondary | ICD-10-CM | POA: Diagnosis not present

## 2021-07-17 DIAGNOSIS — I130 Hypertensive heart and chronic kidney disease with heart failure and stage 1 through stage 4 chronic kidney disease, or unspecified chronic kidney disease: Secondary | ICD-10-CM | POA: Diagnosis not present

## 2021-07-17 DIAGNOSIS — Z9181 History of falling: Secondary | ICD-10-CM | POA: Diagnosis not present

## 2021-07-18 DIAGNOSIS — Z7984 Long term (current) use of oral hypoglycemic drugs: Secondary | ICD-10-CM | POA: Diagnosis not present

## 2021-07-18 DIAGNOSIS — Z950 Presence of cardiac pacemaker: Secondary | ICD-10-CM | POA: Diagnosis not present

## 2021-07-18 DIAGNOSIS — E782 Mixed hyperlipidemia: Secondary | ICD-10-CM | POA: Diagnosis not present

## 2021-07-18 DIAGNOSIS — G309 Alzheimer's disease, unspecified: Secondary | ICD-10-CM | POA: Diagnosis not present

## 2021-07-18 DIAGNOSIS — I495 Sick sinus syndrome: Secondary | ICD-10-CM | POA: Diagnosis not present

## 2021-07-18 DIAGNOSIS — Z7901 Long term (current) use of anticoagulants: Secondary | ICD-10-CM | POA: Diagnosis not present

## 2021-07-18 DIAGNOSIS — M459 Ankylosing spondylitis of unspecified sites in spine: Secondary | ICD-10-CM | POA: Diagnosis not present

## 2021-07-18 DIAGNOSIS — M159 Polyosteoarthritis, unspecified: Secondary | ICD-10-CM | POA: Diagnosis not present

## 2021-07-18 DIAGNOSIS — I5042 Chronic combined systolic (congestive) and diastolic (congestive) heart failure: Secondary | ICD-10-CM | POA: Diagnosis not present

## 2021-07-18 DIAGNOSIS — I130 Hypertensive heart and chronic kidney disease with heart failure and stage 1 through stage 4 chronic kidney disease, or unspecified chronic kidney disease: Secondary | ICD-10-CM | POA: Diagnosis not present

## 2021-07-18 DIAGNOSIS — I480 Paroxysmal atrial fibrillation: Secondary | ICD-10-CM | POA: Diagnosis not present

## 2021-07-18 DIAGNOSIS — E1122 Type 2 diabetes mellitus with diabetic chronic kidney disease: Secondary | ICD-10-CM | POA: Diagnosis not present

## 2021-07-18 DIAGNOSIS — N1831 Chronic kidney disease, stage 3a: Secondary | ICD-10-CM | POA: Diagnosis not present

## 2021-07-18 DIAGNOSIS — Z9181 History of falling: Secondary | ICD-10-CM | POA: Diagnosis not present

## 2021-07-18 LAB — CUP PACEART REMOTE DEVICE CHECK
Battery Remaining Longevity: 90 mo
Battery Remaining Percentage: 98 %
Brady Statistic RA Percent Paced: 38 %
Brady Statistic RV Percent Paced: 70 %
Date Time Interrogation Session: 20230517114600
Implantable Lead Implant Date: 20140929
Implantable Lead Implant Date: 20191115
Implantable Lead Implant Date: 20191115
Implantable Lead Location: 753858
Implantable Lead Location: 753859
Implantable Lead Location: 753859
Implantable Lead Model: 4136
Implantable Lead Model: 4677
Implantable Lead Model: 7742
Implantable Lead Serial Number: 29473237
Implantable Lead Serial Number: 801425
Implantable Lead Serial Number: 808106
Implantable Pulse Generator Implant Date: 20191115
Lead Channel Impedance Value: 372 Ohm
Lead Channel Impedance Value: 456 Ohm
Lead Channel Impedance Value: 692 Ohm
Lead Channel Pacing Threshold Amplitude: 0.4 V
Lead Channel Pacing Threshold Amplitude: 0.6 V
Lead Channel Pacing Threshold Amplitude: 0.8 V
Lead Channel Pacing Threshold Pulse Width: 0.4 ms
Lead Channel Pacing Threshold Pulse Width: 0.4 ms
Lead Channel Pacing Threshold Pulse Width: 0.4 ms
Lead Channel Setting Pacing Amplitude: 2 V
Lead Channel Setting Pacing Amplitude: 2.5 V
Lead Channel Setting Pacing Amplitude: 2.5 V
Lead Channel Setting Pacing Pulse Width: 0.4 ms
Lead Channel Setting Pacing Pulse Width: 0.4 ms
Lead Channel Setting Sensing Sensitivity: 2.5 mV
Lead Channel Setting Sensing Sensitivity: 2.5 mV
Pulse Gen Serial Number: 737231

## 2021-07-22 DIAGNOSIS — Z7901 Long term (current) use of anticoagulants: Secondary | ICD-10-CM | POA: Diagnosis not present

## 2021-07-22 DIAGNOSIS — E1122 Type 2 diabetes mellitus with diabetic chronic kidney disease: Secondary | ICD-10-CM | POA: Diagnosis not present

## 2021-07-22 DIAGNOSIS — Z9181 History of falling: Secondary | ICD-10-CM | POA: Diagnosis not present

## 2021-07-22 DIAGNOSIS — I130 Hypertensive heart and chronic kidney disease with heart failure and stage 1 through stage 4 chronic kidney disease, or unspecified chronic kidney disease: Secondary | ICD-10-CM | POA: Diagnosis not present

## 2021-07-22 DIAGNOSIS — Z950 Presence of cardiac pacemaker: Secondary | ICD-10-CM | POA: Diagnosis not present

## 2021-07-22 DIAGNOSIS — I495 Sick sinus syndrome: Secondary | ICD-10-CM | POA: Diagnosis not present

## 2021-07-22 DIAGNOSIS — N1831 Chronic kidney disease, stage 3a: Secondary | ICD-10-CM | POA: Diagnosis not present

## 2021-07-22 DIAGNOSIS — M159 Polyosteoarthritis, unspecified: Secondary | ICD-10-CM | POA: Diagnosis not present

## 2021-07-22 DIAGNOSIS — M459 Ankylosing spondylitis of unspecified sites in spine: Secondary | ICD-10-CM | POA: Diagnosis not present

## 2021-07-22 DIAGNOSIS — G309 Alzheimer's disease, unspecified: Secondary | ICD-10-CM | POA: Diagnosis not present

## 2021-07-22 DIAGNOSIS — I5042 Chronic combined systolic (congestive) and diastolic (congestive) heart failure: Secondary | ICD-10-CM | POA: Diagnosis not present

## 2021-07-22 DIAGNOSIS — Z7984 Long term (current) use of oral hypoglycemic drugs: Secondary | ICD-10-CM | POA: Diagnosis not present

## 2021-07-22 DIAGNOSIS — E782 Mixed hyperlipidemia: Secondary | ICD-10-CM | POA: Diagnosis not present

## 2021-07-22 DIAGNOSIS — I480 Paroxysmal atrial fibrillation: Secondary | ICD-10-CM | POA: Diagnosis not present

## 2021-07-24 DIAGNOSIS — M159 Polyosteoarthritis, unspecified: Secondary | ICD-10-CM | POA: Diagnosis not present

## 2021-07-24 DIAGNOSIS — E782 Mixed hyperlipidemia: Secondary | ICD-10-CM | POA: Diagnosis not present

## 2021-07-24 DIAGNOSIS — Z950 Presence of cardiac pacemaker: Secondary | ICD-10-CM | POA: Diagnosis not present

## 2021-07-24 DIAGNOSIS — E1122 Type 2 diabetes mellitus with diabetic chronic kidney disease: Secondary | ICD-10-CM | POA: Diagnosis not present

## 2021-07-24 DIAGNOSIS — N1831 Chronic kidney disease, stage 3a: Secondary | ICD-10-CM | POA: Diagnosis not present

## 2021-07-24 DIAGNOSIS — I480 Paroxysmal atrial fibrillation: Secondary | ICD-10-CM | POA: Diagnosis not present

## 2021-07-24 DIAGNOSIS — Z9181 History of falling: Secondary | ICD-10-CM | POA: Diagnosis not present

## 2021-07-24 DIAGNOSIS — G309 Alzheimer's disease, unspecified: Secondary | ICD-10-CM | POA: Diagnosis not present

## 2021-07-24 DIAGNOSIS — I130 Hypertensive heart and chronic kidney disease with heart failure and stage 1 through stage 4 chronic kidney disease, or unspecified chronic kidney disease: Secondary | ICD-10-CM | POA: Diagnosis not present

## 2021-07-24 DIAGNOSIS — I495 Sick sinus syndrome: Secondary | ICD-10-CM | POA: Diagnosis not present

## 2021-07-24 DIAGNOSIS — Z7984 Long term (current) use of oral hypoglycemic drugs: Secondary | ICD-10-CM | POA: Diagnosis not present

## 2021-07-24 DIAGNOSIS — I5042 Chronic combined systolic (congestive) and diastolic (congestive) heart failure: Secondary | ICD-10-CM | POA: Diagnosis not present

## 2021-07-24 DIAGNOSIS — Z7901 Long term (current) use of anticoagulants: Secondary | ICD-10-CM | POA: Diagnosis not present

## 2021-07-24 DIAGNOSIS — M459 Ankylosing spondylitis of unspecified sites in spine: Secondary | ICD-10-CM | POA: Diagnosis not present

## 2021-07-30 NOTE — Progress Notes (Signed)
Remote pacemaker transmission.   

## 2021-07-31 DIAGNOSIS — M159 Polyosteoarthritis, unspecified: Secondary | ICD-10-CM | POA: Diagnosis not present

## 2021-07-31 DIAGNOSIS — Z9181 History of falling: Secondary | ICD-10-CM | POA: Diagnosis not present

## 2021-07-31 DIAGNOSIS — Z7984 Long term (current) use of oral hypoglycemic drugs: Secondary | ICD-10-CM | POA: Diagnosis not present

## 2021-07-31 DIAGNOSIS — M459 Ankylosing spondylitis of unspecified sites in spine: Secondary | ICD-10-CM | POA: Diagnosis not present

## 2021-07-31 DIAGNOSIS — E1122 Type 2 diabetes mellitus with diabetic chronic kidney disease: Secondary | ICD-10-CM | POA: Diagnosis not present

## 2021-07-31 DIAGNOSIS — I5042 Chronic combined systolic (congestive) and diastolic (congestive) heart failure: Secondary | ICD-10-CM | POA: Diagnosis not present

## 2021-07-31 DIAGNOSIS — N1831 Chronic kidney disease, stage 3a: Secondary | ICD-10-CM | POA: Diagnosis not present

## 2021-07-31 DIAGNOSIS — E782 Mixed hyperlipidemia: Secondary | ICD-10-CM | POA: Diagnosis not present

## 2021-07-31 DIAGNOSIS — Z950 Presence of cardiac pacemaker: Secondary | ICD-10-CM | POA: Diagnosis not present

## 2021-07-31 DIAGNOSIS — I480 Paroxysmal atrial fibrillation: Secondary | ICD-10-CM | POA: Diagnosis not present

## 2021-07-31 DIAGNOSIS — I130 Hypertensive heart and chronic kidney disease with heart failure and stage 1 through stage 4 chronic kidney disease, or unspecified chronic kidney disease: Secondary | ICD-10-CM | POA: Diagnosis not present

## 2021-07-31 DIAGNOSIS — Z7901 Long term (current) use of anticoagulants: Secondary | ICD-10-CM | POA: Diagnosis not present

## 2021-07-31 DIAGNOSIS — I495 Sick sinus syndrome: Secondary | ICD-10-CM | POA: Diagnosis not present

## 2021-07-31 DIAGNOSIS — G309 Alzheimer's disease, unspecified: Secondary | ICD-10-CM | POA: Diagnosis not present

## 2021-08-14 ENCOUNTER — Telehealth: Payer: Self-pay | Admitting: Internal Medicine

## 2021-08-14 NOTE — Telephone Encounter (Signed)
Returned call to Pt's wife.  She was referring to CT ordered by PCP.  Advised to call PCP to reschedule.

## 2021-08-14 NOTE — Telephone Encounter (Signed)
Patient's wife called wanting to know if Dr. Lovena Le would order another CT scan for patient.  He's been having a lot of pain he can barley stand up.

## 2021-09-11 ENCOUNTER — Inpatient Hospital Stay: Admission: RE | Admit: 2021-09-11 | Payer: Medicare Other | Source: Ambulatory Visit

## 2021-09-16 ENCOUNTER — Telehealth: Payer: Self-pay

## 2021-09-16 NOTE — Telephone Encounter (Signed)
Outreach  made to Pt's wife.  Wife states she has been struggling to care for Pt with dementia.  He is refusing medications.  She has a follow up with her PCP this week.  Encouraged wife to keep appointment and reach out if any cardiac needs.

## 2021-09-17 ENCOUNTER — Other Ambulatory Visit: Payer: Self-pay

## 2021-09-17 ENCOUNTER — Encounter (HOSPITAL_COMMUNITY): Payer: Self-pay

## 2021-09-17 ENCOUNTER — Inpatient Hospital Stay (HOSPITAL_COMMUNITY)
Admission: EM | Admit: 2021-09-17 | Discharge: 2021-09-29 | DRG: 682 | Disposition: A | Discharge status: Hospice | Payer: Medicare Other | Attending: Family Medicine | Admitting: Family Medicine

## 2021-09-17 DIAGNOSIS — R159 Full incontinence of feces: Secondary | ICD-10-CM | POA: Diagnosis present

## 2021-09-17 DIAGNOSIS — I129 Hypertensive chronic kidney disease with stage 1 through stage 4 chronic kidney disease, or unspecified chronic kidney disease: Secondary | ICD-10-CM | POA: Diagnosis not present

## 2021-09-17 DIAGNOSIS — K7200 Acute and subacute hepatic failure without coma: Secondary | ICD-10-CM | POA: Diagnosis not present

## 2021-09-17 DIAGNOSIS — N400 Enlarged prostate without lower urinary tract symptoms: Secondary | ICD-10-CM

## 2021-09-17 DIAGNOSIS — D631 Anemia in chronic kidney disease: Secondary | ICD-10-CM | POA: Diagnosis not present

## 2021-09-17 DIAGNOSIS — I130 Hypertensive heart and chronic kidney disease with heart failure and stage 1 through stage 4 chronic kidney disease, or unspecified chronic kidney disease: Secondary | ICD-10-CM | POA: Diagnosis not present

## 2021-09-17 DIAGNOSIS — Z515 Encounter for palliative care: Secondary | ICD-10-CM | POA: Diagnosis not present

## 2021-09-17 DIAGNOSIS — R7989 Other specified abnormal findings of blood chemistry: Secondary | ICD-10-CM | POA: Diagnosis present

## 2021-09-17 DIAGNOSIS — D72829 Elevated white blood cell count, unspecified: Secondary | ICD-10-CM | POA: Diagnosis not present

## 2021-09-17 DIAGNOSIS — N184 Chronic kidney disease, stage 4 (severe): Secondary | ICD-10-CM

## 2021-09-17 DIAGNOSIS — R1310 Dysphagia, unspecified: Secondary | ICD-10-CM

## 2021-09-17 DIAGNOSIS — Z66 Do not resuscitate: Secondary | ICD-10-CM | POA: Diagnosis present

## 2021-09-17 DIAGNOSIS — F03918 Unspecified dementia, unspecified severity, with other behavioral disturbance: Secondary | ICD-10-CM

## 2021-09-17 DIAGNOSIS — Z781 Physical restraint status: Secondary | ICD-10-CM

## 2021-09-17 DIAGNOSIS — R000 Tachycardia, unspecified: Secondary | ICD-10-CM | POA: Diagnosis not present

## 2021-09-17 DIAGNOSIS — N19 Unspecified kidney failure: Secondary | ICD-10-CM | POA: Diagnosis not present

## 2021-09-17 DIAGNOSIS — Z6823 Body mass index (BMI) 23.0-23.9, adult: Secondary | ICD-10-CM

## 2021-09-17 DIAGNOSIS — N189 Chronic kidney disease, unspecified: Secondary | ICD-10-CM | POA: Diagnosis not present

## 2021-09-17 DIAGNOSIS — I5042 Chronic combined systolic (congestive) and diastolic (congestive) heart failure: Secondary | ICD-10-CM | POA: Diagnosis not present

## 2021-09-17 DIAGNOSIS — R739 Hyperglycemia, unspecified: Secondary | ICD-10-CM | POA: Diagnosis not present

## 2021-09-17 DIAGNOSIS — M1990 Unspecified osteoarthritis, unspecified site: Secondary | ICD-10-CM | POA: Diagnosis not present

## 2021-09-17 DIAGNOSIS — R188 Other ascites: Secondary | ICD-10-CM | POA: Diagnosis not present

## 2021-09-17 DIAGNOSIS — Z7901 Long term (current) use of anticoagulants: Secondary | ICD-10-CM

## 2021-09-17 DIAGNOSIS — E860 Dehydration: Secondary | ICD-10-CM | POA: Diagnosis not present

## 2021-09-17 DIAGNOSIS — E1122 Type 2 diabetes mellitus with diabetic chronic kidney disease: Secondary | ICD-10-CM | POA: Diagnosis present

## 2021-09-17 DIAGNOSIS — G9341 Metabolic encephalopathy: Secondary | ICD-10-CM

## 2021-09-17 DIAGNOSIS — E1165 Type 2 diabetes mellitus with hyperglycemia: Secondary | ICD-10-CM | POA: Diagnosis not present

## 2021-09-17 DIAGNOSIS — F02818 Dementia in other diseases classified elsewhere, unspecified severity, with other behavioral disturbance: Secondary | ICD-10-CM | POA: Diagnosis not present

## 2021-09-17 DIAGNOSIS — F05 Delirium due to known physiological condition: Secondary | ICD-10-CM | POA: Diagnosis present

## 2021-09-17 DIAGNOSIS — E872 Acidosis, unspecified: Secondary | ICD-10-CM

## 2021-09-17 DIAGNOSIS — G309 Alzheimer's disease, unspecified: Secondary | ICD-10-CM | POA: Diagnosis present

## 2021-09-17 DIAGNOSIS — Z7984 Long term (current) use of oral hypoglycemic drugs: Secondary | ICD-10-CM

## 2021-09-17 DIAGNOSIS — I480 Paroxysmal atrial fibrillation: Secondary | ICD-10-CM | POA: Diagnosis present

## 2021-09-17 DIAGNOSIS — R627 Adult failure to thrive: Secondary | ICD-10-CM | POA: Diagnosis not present

## 2021-09-17 DIAGNOSIS — Z7189 Other specified counseling: Secondary | ICD-10-CM | POA: Diagnosis not present

## 2021-09-17 DIAGNOSIS — E870 Hyperosmolality and hypernatremia: Secondary | ICD-10-CM

## 2021-09-17 DIAGNOSIS — E785 Hyperlipidemia, unspecified: Secondary | ICD-10-CM | POA: Diagnosis not present

## 2021-09-17 DIAGNOSIS — E875 Hyperkalemia: Secondary | ICD-10-CM | POA: Diagnosis not present

## 2021-09-17 DIAGNOSIS — I5084 End stage heart failure: Secondary | ICD-10-CM | POA: Diagnosis not present

## 2021-09-17 DIAGNOSIS — R32 Unspecified urinary incontinence: Secondary | ICD-10-CM | POA: Diagnosis present

## 2021-09-17 DIAGNOSIS — Z79899 Other long term (current) drug therapy: Secondary | ICD-10-CM

## 2021-09-17 DIAGNOSIS — Z8249 Family history of ischemic heart disease and other diseases of the circulatory system: Secondary | ICD-10-CM

## 2021-09-17 DIAGNOSIS — E869 Volume depletion, unspecified: Secondary | ICD-10-CM | POA: Diagnosis not present

## 2021-09-17 DIAGNOSIS — R52 Pain, unspecified: Secondary | ICD-10-CM | POA: Diagnosis not present

## 2021-09-17 DIAGNOSIS — F0284 Dementia in other diseases classified elsewhere, unspecified severity, with anxiety: Secondary | ICD-10-CM | POA: Diagnosis present

## 2021-09-17 DIAGNOSIS — E119 Type 2 diabetes mellitus without complications: Secondary | ICD-10-CM | POA: Diagnosis not present

## 2021-09-17 DIAGNOSIS — D649 Anemia, unspecified: Secondary | ICD-10-CM | POA: Diagnosis not present

## 2021-09-17 DIAGNOSIS — I10 Essential (primary) hypertension: Secondary | ICD-10-CM | POA: Diagnosis present

## 2021-09-17 DIAGNOSIS — E43 Unspecified severe protein-calorie malnutrition: Secondary | ICD-10-CM | POA: Diagnosis not present

## 2021-09-17 DIAGNOSIS — Z950 Presence of cardiac pacemaker: Secondary | ICD-10-CM | POA: Diagnosis present

## 2021-09-17 DIAGNOSIS — R0902 Hypoxemia: Secondary | ICD-10-CM | POA: Diagnosis not present

## 2021-09-17 DIAGNOSIS — I959 Hypotension, unspecified: Secondary | ICD-10-CM | POA: Diagnosis present

## 2021-09-17 DIAGNOSIS — N281 Cyst of kidney, acquired: Secondary | ICD-10-CM | POA: Diagnosis not present

## 2021-09-17 DIAGNOSIS — N179 Acute kidney failure, unspecified: Secondary | ICD-10-CM | POA: Diagnosis not present

## 2021-09-17 DIAGNOSIS — R0689 Other abnormalities of breathing: Secondary | ICD-10-CM | POA: Diagnosis not present

## 2021-09-17 DIAGNOSIS — K5730 Diverticulosis of large intestine without perforation or abscess without bleeding: Secondary | ICD-10-CM | POA: Diagnosis not present

## 2021-09-17 HISTORY — DX: Unspecified dementia, unspecified severity, without behavioral disturbance, psychotic disturbance, mood disturbance, and anxiety: F03.90

## 2021-09-17 NOTE — ED Triage Notes (Signed)
Pt with dementia and alzheimers. Family called bc pt has decreased appetite and has become more combative at home. Family is working with pcp to find placement in snf or hospice. Pt cbg 375 and pt has not been taking PO metformin bc of decreased appetite

## 2021-09-18 ENCOUNTER — Emergency Department (HOSPITAL_COMMUNITY): Payer: Medicare Other

## 2021-09-18 ENCOUNTER — Encounter (HOSPITAL_COMMUNITY): Payer: Self-pay | Admitting: Internal Medicine

## 2021-09-18 DIAGNOSIS — I959 Hypotension, unspecified: Secondary | ICD-10-CM | POA: Diagnosis present

## 2021-09-18 DIAGNOSIS — R188 Other ascites: Secondary | ICD-10-CM | POA: Diagnosis not present

## 2021-09-18 DIAGNOSIS — G309 Alzheimer's disease, unspecified: Secondary | ICD-10-CM | POA: Diagnosis present

## 2021-09-18 DIAGNOSIS — E1122 Type 2 diabetes mellitus with diabetic chronic kidney disease: Secondary | ICD-10-CM | POA: Diagnosis present

## 2021-09-18 DIAGNOSIS — N184 Chronic kidney disease, stage 4 (severe): Secondary | ICD-10-CM | POA: Diagnosis present

## 2021-09-18 DIAGNOSIS — N281 Cyst of kidney, acquired: Secondary | ICD-10-CM | POA: Diagnosis not present

## 2021-09-18 DIAGNOSIS — D631 Anemia in chronic kidney disease: Secondary | ICD-10-CM | POA: Diagnosis present

## 2021-09-18 DIAGNOSIS — N189 Chronic kidney disease, unspecified: Secondary | ICD-10-CM | POA: Diagnosis not present

## 2021-09-18 DIAGNOSIS — E43 Unspecified severe protein-calorie malnutrition: Secondary | ICD-10-CM | POA: Diagnosis present

## 2021-09-18 DIAGNOSIS — N19 Unspecified kidney failure: Secondary | ICD-10-CM | POA: Diagnosis not present

## 2021-09-18 DIAGNOSIS — E860 Dehydration: Secondary | ICD-10-CM | POA: Diagnosis present

## 2021-09-18 DIAGNOSIS — E785 Hyperlipidemia, unspecified: Secondary | ICD-10-CM | POA: Diagnosis present

## 2021-09-18 DIAGNOSIS — N400 Enlarged prostate without lower urinary tract symptoms: Secondary | ICD-10-CM | POA: Diagnosis present

## 2021-09-18 DIAGNOSIS — E1165 Type 2 diabetes mellitus with hyperglycemia: Secondary | ICD-10-CM | POA: Diagnosis present

## 2021-09-18 DIAGNOSIS — E870 Hyperosmolality and hypernatremia: Secondary | ICD-10-CM | POA: Diagnosis not present

## 2021-09-18 DIAGNOSIS — F03918 Unspecified dementia, unspecified severity, with other behavioral disturbance: Secondary | ICD-10-CM | POA: Diagnosis not present

## 2021-09-18 DIAGNOSIS — N179 Acute kidney failure, unspecified: Secondary | ICD-10-CM | POA: Diagnosis present

## 2021-09-18 DIAGNOSIS — R1310 Dysphagia, unspecified: Secondary | ICD-10-CM | POA: Diagnosis not present

## 2021-09-18 DIAGNOSIS — I130 Hypertensive heart and chronic kidney disease with heart failure and stage 1 through stage 4 chronic kidney disease, or unspecified chronic kidney disease: Secondary | ICD-10-CM | POA: Diagnosis present

## 2021-09-18 DIAGNOSIS — I5042 Chronic combined systolic (congestive) and diastolic (congestive) heart failure: Secondary | ICD-10-CM | POA: Diagnosis present

## 2021-09-18 DIAGNOSIS — R7989 Other specified abnormal findings of blood chemistry: Secondary | ICD-10-CM | POA: Diagnosis present

## 2021-09-18 DIAGNOSIS — G9341 Metabolic encephalopathy: Secondary | ICD-10-CM | POA: Diagnosis not present

## 2021-09-18 DIAGNOSIS — I10 Essential (primary) hypertension: Secondary | ICD-10-CM | POA: Diagnosis not present

## 2021-09-18 DIAGNOSIS — Z66 Do not resuscitate: Secondary | ICD-10-CM | POA: Diagnosis present

## 2021-09-18 DIAGNOSIS — M1990 Unspecified osteoarthritis, unspecified site: Secondary | ICD-10-CM | POA: Diagnosis present

## 2021-09-18 DIAGNOSIS — R627 Adult failure to thrive: Secondary | ICD-10-CM | POA: Diagnosis present

## 2021-09-18 DIAGNOSIS — F02818 Dementia in other diseases classified elsewhere, unspecified severity, with other behavioral disturbance: Secondary | ICD-10-CM | POA: Diagnosis present

## 2021-09-18 DIAGNOSIS — K7200 Acute and subacute hepatic failure without coma: Secondary | ICD-10-CM | POA: Diagnosis present

## 2021-09-18 DIAGNOSIS — K5730 Diverticulosis of large intestine without perforation or abscess without bleeding: Secondary | ICD-10-CM | POA: Diagnosis not present

## 2021-09-18 DIAGNOSIS — F05 Delirium due to known physiological condition: Secondary | ICD-10-CM | POA: Diagnosis present

## 2021-09-18 DIAGNOSIS — D72829 Elevated white blood cell count, unspecified: Secondary | ICD-10-CM | POA: Diagnosis not present

## 2021-09-18 DIAGNOSIS — I5084 End stage heart failure: Secondary | ICD-10-CM | POA: Diagnosis present

## 2021-09-18 DIAGNOSIS — Z7189 Other specified counseling: Secondary | ICD-10-CM | POA: Diagnosis not present

## 2021-09-18 DIAGNOSIS — Z515 Encounter for palliative care: Secondary | ICD-10-CM | POA: Diagnosis not present

## 2021-09-18 DIAGNOSIS — E119 Type 2 diabetes mellitus without complications: Secondary | ICD-10-CM | POA: Diagnosis not present

## 2021-09-18 DIAGNOSIS — E872 Acidosis, unspecified: Secondary | ICD-10-CM | POA: Diagnosis present

## 2021-09-18 DIAGNOSIS — F0284 Dementia in other diseases classified elsewhere, unspecified severity, with anxiety: Secondary | ICD-10-CM | POA: Diagnosis present

## 2021-09-18 LAB — CBG MONITORING, ED
Glucose-Capillary: 101 mg/dL — ABNORMAL HIGH (ref 70–99)
Glucose-Capillary: 108 mg/dL — ABNORMAL HIGH (ref 70–99)
Glucose-Capillary: 110 mg/dL — ABNORMAL HIGH (ref 70–99)
Glucose-Capillary: 128 mg/dL — ABNORMAL HIGH (ref 70–99)

## 2021-09-18 LAB — GLUCOSE, CAPILLARY: Glucose-Capillary: 114 mg/dL — ABNORMAL HIGH (ref 70–99)

## 2021-09-18 LAB — CBC WITH DIFFERENTIAL/PLATELET
Abs Immature Granulocytes: 0.04 10*3/uL (ref 0.00–0.07)
Basophils Absolute: 0 10*3/uL (ref 0.0–0.1)
Basophils Relative: 0 %
Eosinophils Absolute: 0 10*3/uL (ref 0.0–0.5)
Eosinophils Relative: 0 %
HCT: 27.5 % — ABNORMAL LOW (ref 39.0–52.0)
Hemoglobin: 8.8 g/dL — ABNORMAL LOW (ref 13.0–17.0)
Immature Granulocytes: 0 %
Lymphocytes Relative: 9 %
Lymphs Abs: 0.9 10*3/uL (ref 0.7–4.0)
MCH: 25.7 pg — ABNORMAL LOW (ref 26.0–34.0)
MCHC: 32 g/dL (ref 30.0–36.0)
MCV: 80.2 fL (ref 80.0–100.0)
Monocytes Absolute: 0.9 10*3/uL (ref 0.1–1.0)
Monocytes Relative: 9 %
Neutro Abs: 7.8 10*3/uL — ABNORMAL HIGH (ref 1.7–7.7)
Neutrophils Relative %: 82 %
Platelets: 303 10*3/uL (ref 150–400)
RBC: 3.43 MIL/uL — ABNORMAL LOW (ref 4.22–5.81)
RDW: 15.8 % — ABNORMAL HIGH (ref 11.5–15.5)
WBC: 9.7 10*3/uL (ref 4.0–10.5)
nRBC: 0.4 % — ABNORMAL HIGH (ref 0.0–0.2)

## 2021-09-18 LAB — URINALYSIS, ROUTINE W REFLEX MICROSCOPIC
Bilirubin Urine: NEGATIVE
Glucose, UA: NEGATIVE mg/dL
Ketones, ur: NEGATIVE mg/dL
Leukocytes,Ua: NEGATIVE
Nitrite: NEGATIVE
Protein, ur: >=300 mg/dL — AB
Specific Gravity, Urine: 1.023 (ref 1.005–1.030)
pH: 5 (ref 5.0–8.0)

## 2021-09-18 LAB — RAPID URINE DRUG SCREEN, HOSP PERFORMED
Amphetamines: NOT DETECTED
Barbiturates: NOT DETECTED
Benzodiazepines: NOT DETECTED
Cocaine: NOT DETECTED
Opiates: NOT DETECTED
Tetrahydrocannabinol: NOT DETECTED

## 2021-09-18 LAB — HEPATITIS PANEL, ACUTE
HCV Ab: NONREACTIVE
Hep A IgM: NONREACTIVE
Hep B C IgM: NONREACTIVE
Hepatitis B Surface Ag: NONREACTIVE

## 2021-09-18 LAB — COMPREHENSIVE METABOLIC PANEL
ALT: 998 U/L — ABNORMAL HIGH (ref 0–44)
AST: 1079 U/L — ABNORMAL HIGH (ref 15–41)
Albumin: 3.3 g/dL — ABNORMAL LOW (ref 3.5–5.0)
Alkaline Phosphatase: 249 U/L — ABNORMAL HIGH (ref 38–126)
Anion gap: 18 — ABNORMAL HIGH (ref 5–15)
BUN: 46 mg/dL — ABNORMAL HIGH (ref 8–23)
CO2: 19 mmol/L — ABNORMAL LOW (ref 22–32)
Calcium: 8.7 mg/dL — ABNORMAL LOW (ref 8.9–10.3)
Chloride: 105 mmol/L (ref 98–111)
Creatinine, Ser: 3.12 mg/dL — ABNORMAL HIGH (ref 0.61–1.24)
GFR, Estimated: 19 mL/min — ABNORMAL LOW (ref >60)
Glucose, Bld: 138 mg/dL — ABNORMAL HIGH (ref 70–99)
Potassium: 4.6 mmol/L (ref 3.5–5.1)
Sodium: 142 mmol/L (ref 135–145)
Total Bilirubin: 2.4 mg/dL — ABNORMAL HIGH (ref 0.3–1.2)
Total Protein: 6.7 g/dL (ref 6.5–8.1)

## 2021-09-18 LAB — CK: Total CK: 83 U/L (ref 49–397)

## 2021-09-18 LAB — ETHANOL: Alcohol, Ethyl (B): <10 mg/dL (ref <10)

## 2021-09-18 LAB — ACETAMINOPHEN LEVEL: Acetaminophen (Tylenol), Serum: <10 ug/mL — ABNORMAL LOW (ref 10–30)

## 2021-09-18 MED ORDER — MEMANTINE HCL 5 MG PO TABS
5.0000 mg | ORAL_TABLET | Freq: Two times a day (BID) | ORAL | Status: DC
  Administered 2021-09-18 – 2021-09-28 (×21): 5 mg via ORAL
  Filled 2021-09-18 (×22): qty 1

## 2021-09-18 MED ORDER — ACETAMINOPHEN 650 MG RE SUPP: 650.0000 mg | Freq: Four times a day (QID) | RECTAL | Status: DC | PRN

## 2021-09-18 MED ORDER — POLYETHYLENE GLYCOL 3350 17 G PO PACK: 17.0000 g | PACK | Freq: Every day | ORAL | Status: DC | PRN

## 2021-09-18 MED ORDER — DOCUSATE SODIUM 100 MG PO CAPS
100.0000 mg | ORAL_CAPSULE | Freq: Two times a day (BID) | ORAL | Status: DC
  Administered 2021-09-18 – 2021-09-27 (×18): 100 mg via ORAL
  Filled 2021-09-18 (×20): qty 1

## 2021-09-18 MED ORDER — SODIUM CHLORIDE 0.9 % IV BOLUS
500.0000 mL | Freq: Once | INTRAVENOUS | Status: AC
  Administered 2021-09-18: 500 mL via INTRAVENOUS

## 2021-09-18 MED ORDER — ONDANSETRON HCL 4 MG/2ML IJ SOLN
4.0000 mg | Freq: Four times a day (QID) | INTRAMUSCULAR | Status: DC | PRN
  Administered 2021-09-21: 4 mg via INTRAVENOUS
  Filled 2021-09-18: qty 2

## 2021-09-18 MED ORDER — OXYCODONE HCL 5 MG PO TABS
5.0000 mg | ORAL_TABLET | ORAL | Status: DC | PRN
  Administered 2021-09-20 – 2021-09-28 (×4): 5 mg via ORAL
  Filled 2021-09-18 (×5): qty 1

## 2021-09-18 MED ORDER — ONDANSETRON HCL 4 MG PO TABS
4.0000 mg | ORAL_TABLET | Freq: Four times a day (QID) | ORAL | Status: DC | PRN
Start: 2021-09-18 — End: 2021-09-28

## 2021-09-18 MED ORDER — LACTATED RINGERS IV SOLN
INTRAVENOUS | Status: DC
Start: 2021-09-18 — End: 2021-09-22

## 2021-09-18 MED ORDER — HALOPERIDOL LACTATE 5 MG/ML IJ SOLN
5.0000 mg | Freq: Four times a day (QID) | INTRAMUSCULAR | Status: DC | PRN
Start: 2021-09-18 — End: 2021-09-26
  Administered 2021-09-26: 5 mg via INTRAVENOUS
  Filled 2021-09-18: qty 1

## 2021-09-18 MED ORDER — TAMSULOSIN HCL 0.4 MG PO CAPS
0.4000 mg | ORAL_CAPSULE | Freq: Every day | ORAL | Status: DC
  Administered 2021-09-19 – 2021-09-27 (×9): 0.4 mg via ORAL
  Filled 2021-09-18 (×11): qty 1

## 2021-09-18 MED ORDER — ACETAMINOPHEN 325 MG PO TABS: 650.0000 mg | ORAL_TABLET | Freq: Four times a day (QID) | ORAL | Status: DC | PRN

## 2021-09-18 MED ORDER — SODIUM CHLORIDE 0.9 % IV SOLN: Freq: Once | INTRAVENOUS | Status: AC

## 2021-09-18 MED ORDER — INSULIN ASPART 100 UNIT/ML IJ SOLN
0.0000 [IU] | Freq: Three times a day (TID) | INTRAMUSCULAR | Status: DC
  Administered 2021-09-19 – 2021-09-22 (×6): 1 [IU] via SUBCUTANEOUS
  Administered 2021-09-23 – 2021-09-24 (×3): 2 [IU] via SUBCUTANEOUS
  Administered 2021-09-25 – 2021-09-27 (×3): 1 [IU] via SUBCUTANEOUS

## 2021-09-18 MED ORDER — HYDRALAZINE HCL 20 MG/ML IJ SOLN: 5.0000 mg | INTRAMUSCULAR | Status: DC | PRN

## 2021-09-18 MED ORDER — BISACODYL 5 MG PO TBEC: 5.0000 mg | DELAYED_RELEASE_TABLET | Freq: Every day | ORAL | Status: DC | PRN

## 2021-09-18 MED ORDER — APIXABAN 2.5 MG PO TABS
2.5000 mg | ORAL_TABLET | Freq: Two times a day (BID) | ORAL | Status: DC
  Administered 2021-09-18 – 2021-09-28 (×21): 2.5 mg via ORAL
  Filled 2021-09-18 (×21): qty 1

## 2021-09-18 NOTE — ED Notes (Signed)
Pt found to be climbing out of the bed, with IV pulled out and clothes soaked in urine. Pt changed into gown and bed changed. Urinal offered.

## 2021-09-18 NOTE — H&P (Signed)
History and Physical    Patient: Ricky Barnett BMW:[id number] DOB: 1940/06/20 DOA: 09/17/2021 DOS: the patient was seen and examined on 09/18/2021 PCP: Ricky Seashore, MD  Patient coming from: Home - lives with wife and occasionally his nephew; NOK: Wife, Ricky Barnett, [place]   Chief Complaint: Decreased appetite, combativeness  HPI: Ricky Barnett is a 81 y.o. male with medical history significant of stage 3b CKD; dementia; DM; HTN; and pacemaker placement presenting with decreased appetite, combativeness.  His wife has noticed his blood sugar has been up, has not been eating or taking his medications like he should.  He has lost a lot of weight.  His dementia is getting worse.  He is getting more physical.  They would like to have him placed.      ER Course:  Carryover, per Dr. Velia Meyer:  Progressively more agitated over the last few days as well as hyperglycemic.  CBG 150 along with AKI superimposed on stage CKD 3B, with presenting serum creatinine noted to be 3.0 (baseline about 2).  Additionally, liver enzymes elevated with AST/ALT in the thousands, with minimal elevation of alkaline phosphatase and total bilirubin.  Ensuing CT abdomen/pelvis showed no evidence of significant acute intra-abdominal or intrapelvic process, including no evidence of acute focal hepatic lesion nor any evidence of acute cholecystitis or biliary obstruction.  UDS negative, CPK nonelevated.  Acute viral hepatitis panel result is pending at this time.  Dr. Dina Rich also to add on serum acetaminophen level.     Review of Systems: unable to review all systems due to the inability of the patient to answer questions. Past Medical History:  Diagnosis Date   Angina    just started recently ,2-3 days ago-went to see Dr. Einar Gip 05/14/2011   Arthritis    bil knees   Chronic kidney disease    Dementia ([place])    Diabetes mellitus    Type II   Dysrhythmia    2-3 yrs ago   Headache(784.0)    occas    Hypertension    Left ventricular dysfunction    Mastoiditis    right ear   Pacemaker 11/29/2012   dual chamber   Palpitations    Past Surgical History:  Procedure Laterality Date   BIV UPGRADE N/A 12/04/2017   Procedure: Dual Chamber PPM upgrading to BIV PPM;  Surgeon: Evans Lance, MD;  Location: [facility];  Service: Cardiovascular;  Laterality: N/A;   CYSTECTOMY     under chin [place]     states he cut finger off when cutting wood   INSERT / REPLACE / REMOVE PACEMAKER  11/29/2012   [employer]    NASAL SINUS SURGERY     left side   PACEMAKER LEAD REMOVAL N/A 01/15/2018   Procedure: PACEMAKER LEAD REMOVAL,REMOVE ONE LEAD,OCCLUDED ACCESS,PUT IN BIVENTRICULAR PACEMAKER;  Surgeon: Evans Lance, MD;  Location: [facility];  Service: Cardiovascular;  Laterality: N/A;   PERMANENT PACEMAKER INSERTION N/A 11/29/2012   Procedure: PERMANENT PACEMAKER INSERTION;  Surgeon: Sanda Klein, MD;  Location: [facility];  Service: Cardiovascular;  Laterality: N/A;   TYMPANOPLASTY  05/19/2011   Procedure: TYMPANOPLASTY;  Surgeon: Thornell Sartorius, MD;  Location: [facility];  Service: ENT;  Laterality: Right;  Exploratory Tympanotomy   Social History:  reports that he has never smoked. He has never used smokeless tobacco. He reports that he does not drink alcohol and does not use drugs.  No Known Allergies  Family History  Problem Relation Age of Onset   Heart attack Father     Prior to Admission medications   Medication Sig Start Date End Date Taking? Authorizing Provider  apixaban (ELIQUIS) 2.5 MG TABS tablet Take 1 tablet (2.5 mg total) by mouth 2 (two) times daily. 06/26/21   Evans Lance, MD  acetaminophen (TYLENOL) 325 MG tablet Take 1-2 tablets (325-650 mg total) by mouth every 4 (four) hours as needed. 11/30/12   Isaiah Serge, NP  ENTRESTO 97-103 MG TAKE 1 TABLET BY MOUTH TWICE A DAY 05/16/21   Evans Lance, MD  memantine (NAMENDA) 5 MG tablet Take 5 mg by  mouth 2 (two) times daily. 07/31/19   [provider]  metFORMIN (GLUCOPHAGE-XR) 500 MG 24 hr tablet Take 1,000 mg by mouth 2 (two) times daily. 09/08/19   [provider]  metFORMIN (GLUCOPHAGE-XR) 750 MG 24 hr tablet Take 1,500 mg by mouth every evening.  10/15/12   [provider]  metoprolol succinate (TOPROL-XL) 50 MG 24 hr tablet Take 50 mg by mouth daily. Take with or immediately following a meal.     [provider]  Multiple Vitamin (MULTIVITAMIN) tablet Take 1 tablet by mouth daily.    [provider]  pravastatin (PRAVACHOL) 40 MG tablet TAKE 1 TABLET BY MOUTH EVERY DAY 09/13/18   Croitoru, Mihai, MD  predniSONE (DELTASONE) 10 MG tablet TAKE 4 TABLETS FOR 3 DAYS, THEN REDUCE BY 1 TABLET EVERY 3 DAYS UNTIL COMPLETED 04/16/18   [provider]  [facility] injection  04/11/19   [provider]  Tamsulosin HCl (FLOMAX) 0.4 MG CAPS Take 0.4 mg by mouth daily after supper.    [provider]  tiZANidine (ZANAFLEX) 4 MG tablet Take 4 mg by mouth 3 (three) times daily as needed. 04/14/18   [provider]  traMADol (ULTRAM) 50 MG tablet Take 50 mg by mouth every 6 (six) hours as needed for moderate pain or severe pain.    [provider]    Physical Exam: Vitals:   09/18/21 0530 09/18/21 0744 09/18/21 1233 09/18/21 1641  BP: 111/61 138/86 127/62 138/78  Pulse: 80 (!) 56 62 60  Resp: 20 20 18 18   Temp:  97.9 F (36.6 C) 98.7 F (37.1 C) 98.5 F (36.9 C)  TempSrc:  Oral Oral Oral  SpO2: 100% 100% 100% 100%  Weight:      Height:       General:  Appears calm but confused, repeatedly saying that he wants to go home now Eyes:  EOMI, normal lids, iris ENT:  grossly normal hearing, mildly dry lips & tongue, mildly dry mm Neck:  no LAD, masses or thyromegaly Cardiovascular:  RR with mild tachycardia, no m/r/g. No LE edema.  Respiratory:   CTA bilaterally with no wheezes/rales/rhonchi.  Mildly increased  respiratory effort. Abdomen:  soft, NT, ND Skin:  no rash or induration seen on limited exam Musculoskeletal:  grossly normal tone BUE/BLE, no bony abnormality Lower extremity:  No LE edema.  Limited foot exam with no ulcerations.  2+ distal pulses. Psychiatric:  confused mood and affect, speech fluent but mildly inappropriate, AOx1-2 Neurologic:  CN 2-12 grossly intact, moves all extremities in coordinated fashion   Radiological Exams on Admission: Independently reviewed - see discussion in A/P where applicable  CT ABDOMEN PELVIS WO CONTRAST  Result Date: 09/18/2021 CLINICAL DATA:  Elevated liver function tests and renal failure. EXAM: CT ABDOMEN AND PELVIS WITHOUT CONTRAST TECHNIQUE: Multidetector CT imaging of the  abdomen and pelvis was performed following the standard protocol without IV contrast. RADIATION DOSE REDUCTION: This exam was performed according to the departmental dose-optimization program which includes automated exposure control, adjustment of the mA and/or kV according to patient size and/or use of iterative reconstruction technique. COMPARISON:  11/01/2013 FINDINGS: Decubitus positioning Lower chest: Right base atelectasis related to positioning. Cardiomegaly and biventricular pacer. Hepatobiliary: No focal liver abnormality.Cholecystectomy. No bile duct dilatation Pancreas: Unremarkable. Spleen: Unremarkable. Adrenals/Urinary Tract: Negative adrenals. No hydronephrosis or stone. Faint cystic density at the upper pole right kidney. Unremarkable bladder. Stomach/Bowel: No obstruction. No bowel wall thickening or appendicitis is seen. Generalized colonic diverticulosis. Vascular/Lymphatic: No acute vascular abnormality. No mass or adenopathy. Nonspecific retroperitoneal edema. Reproductive:No pathologic findings. Other: Trace ascites in the dependent abdomen. Body wall/scrotal edema Musculoskeletal: No acute abnormalities. IMPRESSION: 1. Cardiomegaly and mild third-spacing. 2. No  bowel obstruction or visible inflammation. Electronically Signed   By: Jorje Guild M.D.   On: 09/18/2021 06:31    EKG: Independently reviewed.   0005 - Ventricular-paced with rate 109; NSCSLT 0233 - Ventricular-paced with rate 112; NSCSLT   Labs on Admission: I have personally reviewed the available labs and imaging studies at the time of the admission.  Pertinent labs:   Glucose 138 BUN 46/Creatinine 3.12/GFR 19; 23/2.18/30 in 04/2021 Anion gap 18 AP 249 Albumin 3.3 AST 1079/ALT 998/Bili 2.4 CK 83 WBC 9.7 Hgb 8.8 UA: small Hgb, >300 protein, few bacteria UDS negative ETOH <10    Assessment and Plan: Principal Problem:   AKI (acute kidney injury) ([place]) Active Problems:   Essential hypertension   Dyslipidemia   Non-insulin dependent type 2 diabetes mellitus ([place])   Pacemaker   Paroxysmal atrial fibrillation (HCC)   Chronic combined systolic and diastolic heart failure (HCC)   Failure to thrive in adult   Elevated LFTs   DNR (do not resuscitate)    AKI -Patient with baseline stage 3b CKD presenting with poor PO intake in the setting of worsening dementia -Will admit for rehydration but failure to thrive appears to be a greater concern at this time -Also with elevated LFTs of uncertain etiology -Will hydrate and recheck labs in AM  Elevated LFTs -Possibly related to shock liver in the setting of dehydration -They are significantly elevated, but last apparent baseline was in 2014 so acuity is not clear -Negative acute hepatitis panel -Will hold statin and other hepatotoxic medications -Recheck in AM and consider GI consultation and further evaluation at that time  Failure to thrive due to dementia -At the time of my evaluation, he appeared to have advanced dementia  -While there may be an organic and reversible process in play (see above), it is also quite possible that this is a rapidly progressive cognitive decline associated with dementia -Will admit with  PT/OT/ST/nutrition evaluations -His family is providing 24/7 caregiver assistance and yet he is requiring more care over time and is exhibiting threatening behaviors to his wife; he may benefit from SNF placement -Will request palliative care evaluation for goals of care  -Delirium precautions -prn Haldol -Continue Namenda  DM -hold Glucophage -Cover with moderate-scale SSI   HTN -Toprol XL help due to bradycardia in the ER - although since he has a pacemaker it is likely reasonable to resume this when needed  HLD -Hold Pravachol in the setting of LFT elevation  Afib -Appears stable currently -He is on Eliquis, ?h/o afib, will continue -Has pacemaker -Hold Toprol XL for now, as above  Chronic combined CHF -Volume  depleted, as noted above -[employer] -He is not on diuretics  DNR -I have discussed code status with the patient's wife and he would not desire resuscitation and would prefer to die a natural death should that situation arise. -He will need a gold out of facility DNR form at the time of discharge   Advance Care Planning:   Code Status: DNR   Consults: Palliative care; nutrition; TOC team; PT/OT/ST  DVT Prophylaxis: Eliquis  Family Communication: Wife and another relative were present throughout the evaluation  Severity of Illness: The appropriate patient status for this patient is INPATIENT. Inpatient status is judged to be reasonable and necessary in order to provide the required intensity of service to ensure the patient's safety. The patient's presenting symptoms, physical exam findings, and initial radiographic and laboratory data in the context of their chronic comorbidities is felt to place them at high risk for further clinical deterioration. Furthermore, it is not anticipated that the patient will be medically stable for discharge from the hospital within 2 midnights of admission.   * I certify that at the point of admission it is my clinical judgment  that the patient will require inpatient hospital care spanning beyond 2 midnights from the point of admission due to high intensity of service, high risk for further deterioration and high frequency of surveillance required.*  Author: Karmen Bongo, MD 09/18/2021 5:09 PM  For on call review [email or web address].

## 2021-09-18 NOTE — Progress Notes (Signed)
  Carryover admission to the Day Admitter.  I discussed this case with the EDP, Dr. Dina Rich.  Per these discussions:   This is an 81 year old male who is brought to [facility], ED via EMS for evaluation of altered mental status and hyperglycemia.  The patient reportedly lives at home with his wife, who had noted the patient to be progressively more agitated over the last few days as well as hyperglycemic, prompting her to contact EMS.  Dr. Dina Rich has attempted multiple times to contact the patient's wife to procure additional history, but has been unsuccessful in reaching her.   Work-up in the ED notable for CBG of 150, along with AKI superimposed on stage CKD 3B, with presenting serum creatinine noted to be 3.0.  Relative to baseline creatinine of closer to 2.0.  Additionally, liver enzymes elevated with AST/ALT in the thousands, with minimal elevation of alkaline phosphatase and total bilirubin.  Ensuing CT abdomen/pelvis showed no evidence of significant acute intra-abdominal or intrapelvic process, including no evidence of acute focal hepatic lesion nor any evidence of acute cholecystitis or biliary obstruction.  Additional laboratory evaluation has been notable for urinary drug screen that was pan negative, CPK nonelevated.  Acute viral hepatitis panel result is pending at this time.  Dr. Dina Rich also to add on serum acetaminophen level.  I have placed an order for inpatient admission for further evaluation and management of acute kidney injury superimposed on CKD 3B as well as further evaluation/management of presenting acute transaminitis.   I have placed some additional preliminary admit orders via the adult multi-morbid admission order set.  I have left the patient n.p.o. for now, pending further evaluation by the admitting physician.    Babs Bertin, DO Hospitalist

## 2021-09-18 NOTE — ED Notes (Signed)
Check patient blood sugar it was 101 patient is resting with call bell in reach and family at bedside

## 2021-09-18 NOTE — ED Provider Notes (Signed)
[facility] EMERGENCY DEPARTMENT Provider Note   CSN: [id number] Arrival date & time: 09/17/21  2341     History  Chief Complaint  Patient presents with   Hyperglycemia    Ricky Barnett is a 81 y.o. male.  HPI     This is an 81 year old male who presents by EMS.  Reportedly has a history of Alzheimer's and dementia.  Reported decreased appetite and increased combative.  Unable to obtain collateral information as no one is available by phone.  Patient does not have any physical complaints.  When asked he states that he does not take his medications regularly.  He states he is unsure why he is here.  He is oriented x2.  Level 5 caveat for dementia  Home Medications Prior to Admission medications   Medication Sig Start Date End Date Taking? Authorizing Provider  apixaban (ELIQUIS) 2.5 MG TABS tablet Take 1 tablet (2.5 mg total) by mouth 2 (two) times daily. 06/26/21   Evans Lance, MD  acetaminophen (TYLENOL) 325 MG tablet Take 1-2 tablets (325-650 mg total) by mouth every 4 (four) hours as needed. 11/30/12   Isaiah Serge, NP  ENTRESTO 97-103 MG TAKE 1 TABLET BY MOUTH TWICE A DAY 05/16/21   Evans Lance, MD  memantine (NAMENDA) 5 MG tablet Take 5 mg by mouth 2 (two) times daily. 07/31/19   [provider]  metFORMIN (GLUCOPHAGE-XR) 500 MG 24 hr tablet Take 1,000 mg by mouth 2 (two) times daily. 09/08/19   [provider]  metFORMIN (GLUCOPHAGE-XR) 750 MG 24 hr tablet Take 1,500 mg by mouth every evening.  10/15/12   [provider]  metoprolol succinate (TOPROL-XL) 50 MG 24 hr tablet Take 50 mg by mouth daily. Take with or immediately following a meal.     [provider]  Multiple Vitamin (MULTIVITAMIN) tablet Take 1 tablet by mouth daily.    [provider]  pravastatin (PRAVACHOL) 40 MG tablet TAKE 1 TABLET BY MOUTH EVERY DAY 09/13/18   Croitoru, Mihai, MD  predniSONE (DELTASONE) 10 MG tablet TAKE 4 TABLETS FOR 3  DAYS, THEN REDUCE BY 1 TABLET EVERY 3 DAYS UNTIL COMPLETED 04/16/18   [provider]  [facility] injection  04/11/19   [provider]  Tamsulosin HCl (FLOMAX) 0.4 MG CAPS Take 0.4 mg by mouth daily after supper.    [provider]  tiZANidine (ZANAFLEX) 4 MG tablet Take 4 mg by mouth 3 (three) times daily as needed. 04/14/18   [provider]  traMADol (ULTRAM) 50 MG tablet Take 50 mg by mouth every 6 (six) hours as needed for moderate pain or severe pain.    [provider]      Allergies    Patient has no known allergies.    Review of Systems   Review of Systems  Respiratory:  Negative for shortness of breath.   Cardiovascular:  Negative for chest pain.  Gastrointestinal:  Negative for abdominal pain.  All other systems reviewed and are negative.   Physical Exam Updated Vital Signs BP 111/61   Pulse 80   Temp 97.6 F (36.4 C)   Resp 20   Ht 1.803 m (5\' 11" )   Wt 74.8 kg   SpO2 100%   BMI 23.00 kg/m  Physical Exam Vitals and nursing note reviewed.  Constitutional:      Appearance: He is well-developed. He is obese. He is not ill-appearing.  HENT:     Head: Normocephalic and  atraumatic.     Mouth/Throat:     Mouth: Mucous membranes are dry.  Eyes:     Pupils: Pupils are equal, round, and reactive to light.  Cardiovascular:     Rate and Rhythm: Regular rhythm. Tachycardia present.     Heart sounds: Normal heart sounds. No murmur heard. Pulmonary:     Effort: Pulmonary effort is normal. No respiratory distress.     Breath sounds: Normal breath sounds. No wheezing.  Abdominal:     General: Bowel sounds are normal.     Palpations: Abdomen is soft.     Tenderness: There is no abdominal tenderness. There is no rebound.  Musculoskeletal:     Cervical back: Neck supple.     Comments: Trace bilateral lower extremity edema  Lymphadenopathy:     Cervical: No cervical adenopathy.  Skin:    General: Skin is warm and dry.   Neurological:     Mental Status: He is alert.     Comments: Oriented to person and place, disoriented to time, follows simple commands, 5 out of 5 strength in all 4 extremities  Psychiatric:     Comments: Cooperative     ED Results / Procedures / Treatments   Labs (all labs ordered are listed, but only abnormal results are displayed) Labs Reviewed  CBC WITH DIFFERENTIAL/PLATELET - Abnormal; Notable for the following components:      Result Value   RBC 3.43 (*)    Hemoglobin 8.8 (*)    HCT 27.5 (*)    MCH 25.7 (*)    RDW 15.8 (*)    nRBC 0.4 (*)    Neutro Abs 7.8 (*)    All other components within normal limits  COMPREHENSIVE METABOLIC PANEL - Abnormal; Notable for the following components:   CO2 19 (*)    Glucose, Bld 138 (*)    BUN 46 (*)    Creatinine, Ser 3.12 (*)    Calcium 8.7 (*)    Albumin 3.3 (*)    AST 1,079 (*)    ALT 998 (*)    Alkaline Phosphatase 249 (*)    Total Bilirubin 2.4 (*)    GFR, Estimated 19 (*)    Anion gap 18 (*)    All other components within normal limits  URINALYSIS, ROUTINE W REFLEX MICROSCOPIC - Abnormal; Notable for the following components:   Color, Urine AMBER (*)    APPearance CLOUDY (*)    Hgb urine dipstick SMALL (*)    Protein, ur >=300 (*)    Bacteria, UA FEW (*)    All other components within normal limits  CBG MONITORING, ED - Abnormal; Notable for the following components:   Glucose-Capillary 128 (*)    All other components within normal limits  ETHANOL  CK  RAPID URINE DRUG SCREEN, HOSP PERFORMED  HEPATITIS PANEL, ACUTE    EKG EKG Interpretation  Date/Time:  Wednesday September 18 2021 00:05:47 EDT Ventricular Rate:  109 PR Interval:  139 QRS Duration: 153 QT Interval:  429 QTC Calculation: 578 R Axis:   -75 Text Interpretation: ventricular-paced complexes No significant change since last tracing Confirmed by Thayer Jew [phone]) on 09/18/2021 12:10:11 AM  Radiology No results found.  Procedures .Critical  Care  Performed by: Merryl Hacker, MD Authorized by: Merryl Hacker, MD   Critical care provider statement:    Critical care time (minutes):  35   Critical care was necessary to treat or prevent imminent or life-threatening deterioration of the following conditions:  Renal failure and hepatic failure   Critical care was time spent personally by me on the following activities:  Development of treatment plan with patient or surrogate, discussions with consultants, evaluation of patient's response to treatment, examination of patient, ordering and review of laboratory studies, ordering and review of radiographic studies, ordering and performing treatments and interventions, pulse oximetry, re-evaluation of patient's condition and review of old charts     Medications Ordered in ED Medications  sodium chloride 0.9 % bolus 500 mL (0 mLs Intravenous Stopped 09/18/21 0308)  0.9 %  sodium chloride infusion ( Intravenous New Bag/Given 09/18/21 0503)    ED Course/ Medical Decision Making/ A&P Clinical Course as of 09/18/21 0626  Wed Sep 18, 2021  0530 Attempted again to contact patient's wife.  Unable to contact by phone.  No family at bedside. [CH]    Clinical Course User Index [CH] Bettyjean Stefanski, Barbette Hair, MD                           Medical Decision Making Amount and/or Complexity of Data Reviewed Labs: ordered. Radiology: ordered.  Risk Prescription drug management. Decision regarding hospitalization.   This patient presents to the ED for concern of hyperglycemia, agitation, this involves an extensive number of treatment options, and is a complaint that carries with it a high risk of complications and morbidity.  I considered the following differential and admission for this acute, potentially life threatening condition.  The differential diagnosis includes encephalopathy, UTI, medication indiscretion, advancing dementia  MDM:    This is a 81 year old male who presents with  reported concerns for increased agitation and hyperglycemia.  Patient has no complaints on my evaluation.  I am unable to get up with family for collateral information.  Labs obtained.  Initial bedside glucose 150.  CBC with slightly lower hemoglobin than baseline.  CMP notable for creatinine of 3.12.  Baseline previously around 2.  Also noted to be uremic to 46.  Acute elevation in LFTs with AST of 1079, ALT of 99 8, bilirubin 2.4.  Further work-up initiated including hepatitis screen, EtOH, UDS.  CK is negative.  Given both renal and new onset hepatic failure, CT scan of the abdomen was ordered.  There was delay in getting this done.  I have reviewed this independently.  Do not see any obvious significant abnormalities.  May need right upper quadrant ultrasound.  Regardless, will need admission to manage liver and renal failure.  Second attempt to contact family unsuccessful.  (Labs, imaging, consults)  Labs: I Ordered, and personally interpreted labs.  The pertinent results include: CBC, CMP, ethanol, CK, hepatitis screen, urinalysis  Imaging Studies ordered: I ordered imaging studies including CT abdomen pelvis I independently visualized and interpreted imaging. I agree with the radiologist interpretation  Additional history obtained from EMS.  External records from outside source obtained and reviewed including outside records including primary evaluations  Cardiac Monitoring: The patient was maintained on a cardiac monitor.  I personally viewed and interpreted the cardiac monitored which showed an underlying rhythm of: Sinus tachycardia  Reevaluation: After the interventions noted above, I reevaluated the patient and found that they have :stayed the same  Social Determinants of Health: Dementia, lives with wife currently  Disposition: Admit  Co morbidities that complicate the patient evaluation  Past Medical History:  Diagnosis Date   Angina    just started recently ,2-3 days  ago-went to see Dr. Einar Gip 05/14/2011   Arthritis  bil knees   Chronic kidney disease    Diabetes mellitus    Type II   Dysrhythmia    2-3 yrs ago   Headache(784.0)    occas   Hypertension    Left ventricular dysfunction    Mastoiditis    right ear   Pacemaker 11/29/2012   dual chamber   Palpitations      Medicines Meds ordered this encounter  Medications   sodium chloride 0.9 % bolus 500 mL   0.9 %  sodium chloride infusion    I have reviewed the patients home medicines and have made adjustments as needed  Problem List / ED Course: Problem List Items Addressed This Visit   None Visit Diagnoses     AKI (acute kidney injury) ([place])    -  Primary   Elevated LFTs                       Final Clinical Impression(s) / ED Diagnoses Final diagnoses:  AKI (acute kidney injury) ([place])  Elevated LFTs    Rx / DC Orders ED Discharge Orders     None         Merryl Hacker, MD 09/18/21 [phone]

## 2021-09-18 NOTE — ED Notes (Signed)
Franz Svec wife [phone] requesting an update on the patient

## 2021-09-18 NOTE — Progress Notes (Signed)
Patient arrived to [place] room 20 alert to self only. Bed in lowest position. Bed alarm on. Call light in reach. Consistently monitoring patient

## 2021-09-19 ENCOUNTER — Encounter (HOSPITAL_COMMUNITY): Payer: Self-pay | Admitting: Internal Medicine

## 2021-09-19 DIAGNOSIS — E785 Hyperlipidemia, unspecified: Secondary | ICD-10-CM | POA: Diagnosis not present

## 2021-09-19 DIAGNOSIS — R627 Adult failure to thrive: Secondary | ICD-10-CM | POA: Diagnosis not present

## 2021-09-19 DIAGNOSIS — Z515 Encounter for palliative care: Secondary | ICD-10-CM | POA: Diagnosis not present

## 2021-09-19 DIAGNOSIS — I5042 Chronic combined systolic (congestive) and diastolic (congestive) heart failure: Secondary | ICD-10-CM | POA: Diagnosis not present

## 2021-09-19 DIAGNOSIS — Z7189 Other specified counseling: Secondary | ICD-10-CM

## 2021-09-19 DIAGNOSIS — R7989 Other specified abnormal findings of blood chemistry: Secondary | ICD-10-CM | POA: Diagnosis not present

## 2021-09-19 DIAGNOSIS — N179 Acute kidney failure, unspecified: Secondary | ICD-10-CM | POA: Diagnosis not present

## 2021-09-19 DIAGNOSIS — I10 Essential (primary) hypertension: Secondary | ICD-10-CM

## 2021-09-19 DIAGNOSIS — E119 Type 2 diabetes mellitus without complications: Secondary | ICD-10-CM

## 2021-09-19 LAB — COMPREHENSIVE METABOLIC PANEL
ALT: 799 U/L — ABNORMAL HIGH (ref 0–44)
AST: 506 U/L — ABNORMAL HIGH (ref 15–41)
Albumin: 3 g/dL — ABNORMAL LOW (ref 3.5–5.0)
Alkaline Phosphatase: 202 U/L — ABNORMAL HIGH (ref 38–126)
Anion gap: 14 (ref 5–15)
BUN: 50 mg/dL — ABNORMAL HIGH (ref 8–23)
CO2: 22 mmol/L (ref 22–32)
Calcium: 8.6 mg/dL — ABNORMAL LOW (ref 8.9–10.3)
Chloride: 109 mmol/L (ref 98–111)
Creatinine, Ser: 2.89 mg/dL — ABNORMAL HIGH (ref 0.61–1.24)
GFR, Estimated: 21 mL/min — ABNORMAL LOW (ref >60)
Glucose, Bld: 113 mg/dL — ABNORMAL HIGH (ref 70–99)
Potassium: 4.6 mmol/L (ref 3.5–5.1)
Sodium: 145 mmol/L (ref 135–145)
Total Bilirubin: 3.2 mg/dL — ABNORMAL HIGH (ref 0.3–1.2)
Total Protein: 6.2 g/dL — ABNORMAL LOW (ref 6.5–8.1)

## 2021-09-19 LAB — GLUCOSE, CAPILLARY
Glucose-Capillary: 102 mg/dL — ABNORMAL HIGH (ref 70–99)
Glucose-Capillary: 121 mg/dL — ABNORMAL HIGH (ref 70–99)
Glucose-Capillary: 79 mg/dL (ref 70–99)
Glucose-Capillary: 124 mg/dL — ABNORMAL HIGH (ref 70–99)

## 2021-09-19 LAB — CBC
HCT: 27.6 % — ABNORMAL LOW (ref 39.0–52.0)
Hemoglobin: 8.9 g/dL — ABNORMAL LOW (ref 13.0–17.0)
MCH: 25.9 pg — ABNORMAL LOW (ref 26.0–34.0)
MCHC: 32.2 g/dL (ref 30.0–36.0)
MCV: 80.5 fL (ref 80.0–100.0)
Platelets: 283 10*3/uL (ref 150–400)
RBC: 3.43 MIL/uL — ABNORMAL LOW (ref 4.22–5.81)
RDW: 16.3 % — ABNORMAL HIGH (ref 11.5–15.5)
WBC: 10.3 10*3/uL (ref 4.0–10.5)
nRBC: 0.8 % — ABNORMAL HIGH (ref 0.0–0.2)

## 2021-09-19 MED ORDER — ADULT MULTIVITAMIN W/MINERALS CH
1.0000 | ORAL_TABLET | Freq: Every day | ORAL | Status: DC
  Administered 2021-09-19 – 2021-09-27 (×9): 1 via ORAL
  Filled 2021-09-19 (×9): qty 1

## 2021-09-19 MED ORDER — PROSOURCE PLUS PO LIQD
30.0000 mL | Freq: Three times a day (TID) | ORAL | Status: DC
  Administered 2021-09-19 – 2021-09-24 (×9): 30 mL via ORAL
  Filled 2021-09-19 (×12): qty 30

## 2021-09-19 MED ORDER — METOPROLOL SUCCINATE ER 25 MG PO TB24
50.0000 mg | ORAL_TABLET | Freq: Every day | ORAL | Status: DC
  Administered 2021-09-19 – 2021-09-23 (×5): 50 mg via ORAL
  Filled 2021-09-19 (×5): qty 2

## 2021-09-19 MED ORDER — ENSURE ENLIVE PO LIQD
237.0000 mL | Freq: Three times a day (TID) | ORAL | Status: DC
  Administered 2021-09-19 – 2021-09-27 (×15): 237 mL via ORAL

## 2021-09-19 NOTE — Progress Notes (Signed)
PROGRESS NOTE    Ricky Barnett  YHC:[id number] DOB: 12-28-1940 DOA: 09/17/2021 PCP: Merrilee Seashore, MD   Brief Narrative: Ricky Barnett is a 81 y.o. male with a history of CKD stage IIIb, dementia, diabetes mellitus, hypertension and atrial fibrillation s/p pacemaker placement. Patient presented secondary to decreased appetite and combativeness in setting of dementia. Patient noted to have evidence of AKI and possible shock liver.   Assessment and Plan:  AKI on CKD stage IIIb Baseline creatinine of about 2. Creatine of 3.12 on admission. Complicated by hypotension and Entresto usage. Entresto held. IV fluids initiated. Creatinine of 2.89 today. -Continue IV fluids -Strict in/out and daily weights  Elevated LFTs AST/ALT of 1079/998 with associated total bilirubin of 2.4 an alkaline phosphatase of 249 on admission. Possibly secondary to shock liver from hypotension. Improving AST/ALT with increased total bilirubin. CT abdomen/pelvis without hepatobiliary etiology noted. -CMP in AM  Failure to thrive Thought to be secondary to dementia. Palliative care consulted. Dietitian consulted.  Dementia Noted. Patient has 24/7 caregiver assistance. -Continue Namenda -PT/OT eval  Diabetes mellitus, type 2 Patient is on metformin as an outpatient -Continue SSI  Primary hypertension -Restart home Toprol XL and hold Entresto  Hyperlipidemia Pravastatin held secondary to elevated AST/ALT  Atrial fibrillation -Continue Eliquis. Patient is s/p pacemaker placement. He is on Toprol XL as an outpatient for rate control which was held on admission because of bradycardia. -Restart Toprol XL  Chronic combined systolic and diastolic heart failure Most recent Transthoracic Echocardiogram significant for EF of 20% with grade 2 diastolic dysfunction from 2019. Patient is on Entresto and Toprol XL as an outpatient, which were held on admission.  DVT prophylaxis: Eliquis Code Status:   Code  Status: DNR Family Communication: None at bedside Disposition Plan: Discharge pending PT/OT recommendations and improvement of AKI   Consultants:  Palliative care medicine  Procedures:  None  Antimicrobials: None    Subjective: Patient is trying to get out of bed to urinate. No other concerns.  Objective: BP 131/87 (BP Location: Left Arm)   Pulse 96   Temp 97.6 F (36.4 C) (Oral)   Resp 17   Ht 5\' 11"  (1.803 m)   Wt 74.8 kg   SpO2 98%   BMI 23.00 kg/m   Examination:  General exam: Appears calm and comfortable Respiratory system: Clear to auscultation. Respiratory effort normal. Cardiovascular system: S1 & S2 heard, normal rate.  Gastrointestinal system: Abdomen is nondistended, soft and nontender. No organomegaly or masses felt. Normal bowel sounds heard. Central nervous system: Alert and oriented to person and place. Musculoskeletal: No calf tenderness Skin: No cyanosis. No rashes Psychiatry: Judgement and insight appear impaired. Mood & affect appropriate.    Data Reviewed: I have personally reviewed following labs and imaging studies  CBC Lab Results  Component Value Date   WBC 10.3 09/19/2021   RBC 3.43 (L) 09/19/2021   HGB 8.9 (L) 09/19/2021   HCT 27.6 (L) 09/19/2021   MCV 80.5 09/19/2021   MCH 25.9 (L) 09/19/2021   PLT 283 09/19/2021   MCHC 32.2 09/19/2021   RDW 16.3 (H) 09/19/2021   LYMPHSABS 0.9 09/18/2021   MONOABS 0.9 09/18/2021   EOSABS 0.0 09/18/2021   BASOSABS 0.0 51/76/1607     Last metabolic panel Lab Results  Component Value Date   NA 145 09/19/2021   K 4.6 09/19/2021   CL 109 09/19/2021   CO2 22 09/19/2021   BUN 50 (H) 09/19/2021   CREATININE 2.89 (H) 09/19/2021  GLUCOSE 113 (H) 09/19/2021   GFRNONAA 21 (L) 09/19/2021   GFRAA 35 (L) 01/01/2018   CALCIUM 8.6 (L) 09/19/2021   PROT 6.2 (L) 09/19/2021   ALBUMIN 3.0 (L) 09/19/2021   BILITOT 3.2 (H) 09/19/2021   ALKPHOS 202 (H) 09/19/2021   AST 506 (H) 09/19/2021   ALT 799  (H) 09/19/2021   ANIONGAP 14 09/19/2021    GFR: Estimated Creatinine Clearance: 20.8 mL/min (A) (by C-G formula based on SCr of 2.89 mg/dL (H)).  No results found for this or any previous visit (from the past 240 hour(s)).    Radiology Studies: CT ABDOMEN PELVIS WO CONTRAST  Result Date: 09/18/2021 CLINICAL DATA:  Elevated liver function tests and renal failure. EXAM: CT ABDOMEN AND PELVIS WITHOUT CONTRAST TECHNIQUE: Multidetector CT imaging of the abdomen and pelvis was performed following the standard protocol without IV contrast. RADIATION DOSE REDUCTION: This exam was performed according to the departmental dose-optimization program which includes automated exposure control, adjustment of the mA and/or kV according to patient size and/or use of iterative reconstruction technique. COMPARISON:  11/01/2013 FINDINGS: Decubitus positioning Lower chest: Right base atelectasis related to positioning. Cardiomegaly and biventricular pacer. Hepatobiliary: No focal liver abnormality.Cholecystectomy. No bile duct dilatation Pancreas: Unremarkable. Spleen: Unremarkable. Adrenals/Urinary Tract: Negative adrenals. No hydronephrosis or stone. Faint cystic density at the upper pole right kidney. Unremarkable bladder. Stomach/Bowel: No obstruction. No bowel wall thickening or appendicitis is seen. Generalized colonic diverticulosis. Vascular/Lymphatic: No acute vascular abnormality. No mass or adenopathy. Nonspecific retroperitoneal edema. Reproductive:No pathologic findings. Other: Trace ascites in the dependent abdomen. Body wall/scrotal edema Musculoskeletal: No acute abnormalities. IMPRESSION: 1. Cardiomegaly and mild third-spacing. 2. No bowel obstruction or visible inflammation. Electronically Signed   By: Jorje Guild M.D.   On: 09/18/2021 06:31      LOS: 1 day    Cordelia Poche, MD [facility] 09/19/2021, 2:32 PM   If 7PM-7AM, please contact night-coverage [email or web address]

## 2021-09-19 NOTE — Evaluation (Signed)
Physical Therapy Evaluation Patient Details Name: Ricky Barnett MRN: [id number] DOB: [birth year] Today's Date: 09/19/2021  History of Present Illness  Ricky Barnett is a 81 y.o. male admitted 7/18 presenting with decreased appetite, combativeness.  His wife has noticed his blood sugar has been up, has not been eating or taking his medications like he should.  He has lost a lot of weight.  His dementia is getting worse.  He is getting more physical. PMH:  stage 3b CKD; dementia; DM; HTN; and pacemaker placement  Clinical Impression  Pt admitted with above diagnosis. Pt was able to sit EOB with min assist and cues for balance.  Low tolerance for activity. Partial stand with mod assist to scooot to [facility].  Wife reports she cannot care for pt at home as he has been requiring total assit for most mobility for weeks.  Recommend SNF.   Pt currently with functional limitations due to the deficits listed below (see PT Problem List). Pt will benefit from skilled PT to increase their independence and safety with mobility to allow discharge to the venue listed below.          Recommendations for follow up therapy are one component of a multi-disciplinary discharge planning process, led by the attending physician.  Recommendations may be updated based on patient status, additional functional criteria and insurance authorization.  Follow Up Recommendations Skilled nursing-short term rehab (<3 hours/day) Can patient physically be transported by private vehicle: Yes    Assistance Recommended at Discharge Frequent or constant Supervision/Assistance  Patient can return home with the following  A lot of help with walking and/or transfers;A lot of help with bathing/dressing/bathroom;Direct supervision/assist for medications management;Assistance with feeding;Assistance with cooking/housework;Direct supervision/assist for financial management;Assist for transportation;Help with stairs or ramp for entrance    Equipment  Recommendations None recommended by PT  Recommendations for Other Services       Functional Status Assessment Patient has had a recent decline in their functional status and demonstrates the ability to make significant improvements in function in a reasonable and predictable amount of time.     Precautions / Restrictions Precautions Precautions: Fall Restrictions Weight Bearing Restrictions: No      Mobility  Bed Mobility Overal bed mobility: Needs Assistance Bed Mobility: Supine to Sit     Supine to sit: Mod assist     General bed mobility comments: Needed mod assist to initiate LEs off bed and for elevation of trunk.    Transfers Overall transfer level: Needs assistance Equipment used: 1 person hand held assist Transfers: Sit to/from Stand, Bed to chair/wheelchair/BSC Sit to Stand: Mod assist, From elevated surface           General transfer comment: Pt needed mod assist to stand partially and scoot up to [facility]. Pt fatigued after sitting EOB to eat breakfast.    Ambulation/Gait                  Stairs            Wheelchair Mobility    Modified Rankin (Stroke Patients Only)       Balance Overall balance assessment: Needs assistance Sitting-balance support: No upper extremity supported, Feet supported, Single extremity supported Sitting balance-Leahy Scale: Poor Sitting balance - Comments: Pt needed at least one UE support or min assist at times as he fatigued as he would lose balance while eating breakfast at EOB.  Pt ate a few bites and then wanted to lay back down after 10 min  sitting EOb. Postural control: Posterior lean, Right lateral lean, Left lateral lean Standing balance support: Single extremity supported, During functional activity Standing balance-Leahy Scale: Poor Standing balance comment: Needs UE and external support                             Pertinent Vitals/Pain Pain Assessment Pain Assessment: No/denies pain     Home Living Family/patient expects to be discharged to:: Private residence Living Arrangements: Spouse/significant other Available Help at Discharge: Family;Available 24 hours/day (wife) Type of Home: House Home Access: Stairs to enter Entrance Stairs-Rails: Right;Left;Can reach both Entrance Stairs-Number of Steps: 4   Home Layout: One level Home Equipment: Cane - single Barista (2 wheels);Shower seat;Wheelchair - manual      Prior Function               Mobility Comments: Wife states pt was using cane and was able to ambulate on own until last month she has to provide mod to max assist at times and sometimes has to use wheelchair. ADLs Comments: total assist by wife for sponge baathing and dressing.     Hand Dominance   Dominant Hand: Right    Extremity/Trunk Assessment   Upper Extremity Assessment Upper Extremity Assessment: Defer to OT evaluation    Lower Extremity Assessment Lower Extremity Assessment: Generalized weakness    Cervical / Trunk Assessment Cervical / Trunk Assessment: Kyphotic (kyphotic as he fatigues)  Communication   Communication: No difficulties  Cognition Arousal/Alertness: Awake/alert Behavior During Therapy: WFL for tasks assessed/performed Overall Cognitive Status: History of cognitive impairments - at baseline Area of Impairment: Memory, Orientation, Following commands, Safety/judgement, Awareness, Problem solving                 Orientation Level: Disoriented to, Place, Time, Situation   Memory: Decreased recall of precautions, Decreased short-term memory Following Commands: Follows one step commands inconsistently Safety/Judgement: Decreased awareness of safety, Decreased awareness of deficits   Problem Solving: Slow processing, Requires tactile cues, Requires verbal cues General Comments: Pt with worsening dementia.        General Comments      Exercises     Assessment/Plan    PT Assessment  Patient needs continued PT services  PT Problem List Decreased balance;Decreased activity tolerance;Decreased mobility;Decreased knowledge of use of DME;Decreased safety awareness;Decreased knowledge of precautions       PT Treatment Interventions DME instruction;Gait training;Functional mobility training;Therapeutic activities;Therapeutic exercise;Balance training;Patient/family education    PT Goals (Current goals can be found in the Care Plan section)  Acute Rehab PT Goals Patient Stated Goal: to go to SNF PT Goal Formulation: With patient/family Time For Goal Achievement: 10/03/21 Potential to Achieve Goals: Fair    Frequency Min 2X/week     Co-evaluation               AM-PAC PT "6 Clicks" Mobility  Outcome Measure Help needed turning from your back to your side while in a flat bed without using bedrails?: A Little Help needed moving from lying on your back to sitting on the side of a flat bed without using bedrails?: A Lot Help needed moving to and from a bed to a chair (including a wheelchair)?: Total Help needed standing up from a chair using your arms (e.g., wheelchair or bedside chair)?: Total Help needed to walk in hospital room?: Total Help needed climbing 3-5 steps with a railing? : Total 6 Click Score: 9    End  of Session Equipment Utilized During Treatment: Gait belt Activity Tolerance: Patient limited by fatigue Patient left: with call bell/phone within reach;in bed;with bed alarm set;with family/visitor present Nurse Communication: Mobility status PT Visit Diagnosis: Muscle weakness (generalized) (M62.81);Unsteadiness on feet (R26.81)    Time: 9412-9047 PT Time Calculation (min) (ACUTE ONLY): 18 min   Charges:   PT Evaluation $PT Eval Moderate Complexity: 1 Mod          Raun Routh M,PT Acute Rehab Services [phone]   Alvira Philips 09/19/2021, 11:06 AM

## 2021-09-19 NOTE — Consult Note (Signed)
Consultation Note Date: 09/19/2021   Patient Name: Ricky Barnett  DOB: 07/27/1940  MRN: [id number]  Age / Sex: 81 y.o., male  PCP: Merrilee Seashore, MD Referring Physician: Mariel Aloe, MD  Reason for Consultation: Establishing goals of care  HPI/Patient Profile: 81 y.o. male  with past medical history of dementia, CKD 3, DM, HTN, pacemaker, admitted on 09/17/2021 with acute kidney injury on chronic kidney disease, dehydration, elevated LFTs of uncertain etiology, failure to thrive.   Clinical Assessment and Goals of Care: I have reviewed medical records including EPIC notes, labs and imaging, received report from RN, assessed the patient.  [name], Ricky Barnett, is sitting up on the edge of the bed.  He greets me, making and somewhat keeping eye contact.  He appears acutely/chronically ill and somewhat frail.  He is alert, oriented to self and situation, but with known dementia.  His wife since 1995, Stephanie Coup, and her brother Gwyndolyn Saxon, are present at bedside.  Speech therapy arrives for evaluation.  We meet at the bedside to discuss diagnosis prognosis, GOC, EOL wishes, disposition and options.  I introduced Palliative Medicine as specialized medical care for people living with serious illness. It focuses on providing relief from the symptoms and stress of a serious illness. The goal is to improve quality of life for both the patient and the family.  We discussed a brief life review of the patient.  Mr. and Mrs. Groeneveld have been married since 1995.  Mr. Rossman has a daughter who lives in [place], and a son who died in January 31, 2022.  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  Mrs. Moede states that she has seen a marked decline since the death of his son.  She tells me that he is unable to complete ADLs.  He is also incontinent of bowel and bladder wearing  depends.  She tells me that at times he will remove the adult diaper and have accidents.  She also describes pocketing food while eating, poor by mouth intake.  She describes sundowning, stating that he is up and down through the night.  We then focused on their current illness.  We talked about his acute kidney injury, and the treatment plan.  We talk about speech therapy consult.  We talk about physical therapy consult and recommendations.  We talked about the natural changes that occur for people with memory loss including, but not limited to mental status changes, physical changes, nutritional changes.  The natural disease trajectory and expectations at EOL were discussed.  Advanced directives, concepts specific to code status, artifical feeding and hydration, and rehospitalization were considered and discussed.  Mr. Hinks is "treat the treatable, but allowing natural passing"/DNR.  We talk about Mr. Eaves's poor by mouth intake.  We talk about PEG tubes/artificial feeding.  Pamala Hurry and Gwyndolyn Saxon shared that their mother had this and it did not change things for her.  Mrs. Schlitt shares that she would not want PEG tube  for Mr. Withem.  Hospice and Palliative Care services outpatient were explained and offered.  We talk in detail about palliative and hospice care.  Both at home and in a facility.  We also briefly talk about short-term rehab versus long-term care.  I encourage Mrs. Laton to lean on transition of care team for specifics.  I shared that they will reach out to her.  Discussed the importance of continued conversation with family and the medical providers regarding overall plan of care and treatment options, ensuring decisions are within the context of the patient's values and GOCs.  Questions and concerns were addressed.   The family was encouraged to call with questions or concerns.  PMT will continue to support holistically.  Conference with attending, bedside nursing staff, transition of care team  related to patient condition, needs, goals of care, disposition.   HCPOA NEXT OF KIN -wife since Esmeralda   At this point continue to treat the treatable but no CPR or intubation Time for outcomes. Short-term rehab/anticipate long-term care Outpatient palliative services initially while in rehab Anticipate transitioning to "treat the treatable" hospice care relatively quickly   Code Status/Advance Care Planning: DNR  Symptom Management:  Per hospitalist, no additional needs at this time.  Palliative Prophylaxis:  Delirium Protocol and Oral Care  Additional Recommendations (Limitations, Scope, Preferences): Treat the treatable but no CPR or intubation.  Psycho-social/Spiritual:  Desire for further Chaplaincy support:yes Additional Recommendations: Caregiving  Support/Resources and Education on Hospice  Prognosis:  Unable to determine, based on outcomes.  6 months or less would not be surprising based on chronic illness burden, decreasing functional status, frailty.  Discharge Planning:  Requesting short-term rehab with anticipated transition to long-term care.  Outpatient palliative services transitioning to "treat the treatable" hospice care when appropriate.       Primary Diagnoses: Present on Admission:  AKI (acute kidney injury) ([place])  Failure to thrive in adult  Paroxysmal atrial fibrillation [facility])  Pacemaker  Essential hypertension  Dyslipidemia  Chronic combined systolic and diastolic heart failure (HCC)  Elevated LFTs  DNR (do not resuscitate)   I have reviewed the medical record, interviewed the patient and family, and examined the patient. The following aspects are pertinent.  Past Medical History:  Diagnosis Date   Angina    just started recently ,2-3 days ago-went to see Dr. Einar Gip 05/14/2011   Arthritis    bil knees   Chronic kidney disease    Dementia ([place])    Diabetes mellitus    Type II   Dysrhythmia     2-3 yrs ago   Headache(784.0)    occas   Hypertension    Left ventricular dysfunction    Mastoiditis    right ear   Pacemaker 11/29/2012   dual chamber   Palpitations    Social History   Socioeconomic History   Marital status: Married    Spouse name: Not on file   Number of children: Not on file   Years of education: Not on file   Highest education level: Not on file  Occupational History   Not on file  Tobacco Use   Smoking status: Never   Smokeless tobacco: Never  Vaping Use   Vaping Use: Never used  Substance and Sexual Activity   Alcohol use: No   Drug use: No   Sexual activity: Not on file  Other Topics Concern   Not on file  Social History Narrative   Not  on file   Social Determinants of Health   Financial Resource Strain: Not on file  Food Insecurity: Not on file  Transportation Needs: Not on file  Physical Activity: Not on file  Stress: Not on file  Social Connections: Not on file   Family History  Problem Relation Age of Onset   Heart attack Father    Scheduled Meds:  apixaban  2.5 mg Oral BID   docusate sodium  100 mg Oral BID   insulin aspart  0-9 Units Subcutaneous TID WC   memantine  5 mg Oral BID   tamsulosin  0.4 mg Oral QPC supper   Continuous Infusions:  lactated ringers 75 mL/hr at 09/19/21 0638   PRN Meds:.bisacodyl, haloperidol lactate, hydrALAZINE, ondansetron **OR** ondansetron (ZOFRAN) IV, oxyCODONE, polyethylene glycol Medications Prior to Admission:  Prior to Admission medications   Medication Sig Start Date End Date Taking? Authorizing Provider  acetaminophen (TYLENOL) 500 MG tablet Take 500 mg by mouth as needed (pain).   Yes [provider]  apixaban (ELIQUIS) 2.5 MG TABS tablet Take 1 tablet (2.5 mg total) by mouth 2 (two) times daily. 06/26/21  Yes Evans Lance, MD  ENTRESTO 97-103 MG TAKE 1 TABLET BY MOUTH TWICE A DAY Patient taking differently: Take 1 tablet by mouth 2 (two) times daily. 05/16/21  Yes  Evans Lance, MD  memantine (NAMENDA) 5 MG tablet Take 5 mg by mouth 2 (two) times daily. 07/31/19  Yes [provider]  metFORMIN (GLUCOPHAGE-XR) 500 MG 24 hr tablet Take 1,000 mg by mouth 2 (two) times daily. 09/08/19  Yes [provider]  metoprolol succinate (TOPROL-XL) 50 MG 24 hr tablet Take 50 mg by mouth daily. Take with or immediately following a meal.    Yes [provider]  pravastatin (PRAVACHOL) 40 MG tablet TAKE 1 TABLET BY MOUTH EVERY DAY Patient taking differently: Take 40 mg by mouth daily. 09/13/18  Yes Croitoru, Mihai, MD  Tamsulosin HCl (FLOMAX) 0.4 MG CAPS Take 0.4 mg by mouth daily after supper.   Yes [provider]  traMADol (ULTRAM) 50 MG tablet Take 50 mg by mouth as needed for moderate pain or severe pain.   Yes [provider]  Multiple Vitamin (MULTIVITAMIN) tablet Take 1 tablet by mouth daily. Patient not taking: Reported on 09/18/2021    [provider]   No Known Allergies Review of Systems  Unable to perform ROS: Dementia    Physical Exam Vitals and nursing note reviewed.  Constitutional:      General: He is not in acute distress.    Appearance: Normal appearance.  Cardiovascular:     Rate and Rhythm: Normal rate.  Pulmonary:     Effort: Pulmonary effort is normal. No respiratory distress.  Musculoskeletal:        General: No swelling.  Skin:    General: Skin is warm and dry.  Neurological:     Mental Status: He is alert.     Comments: Dementia  Psychiatric:        Mood and Affect: Mood normal.        Behavior: Behavior normal.     Vital Signs: BP 133/67 (BP Location: Right Arm)   Pulse (!) 53   Temp 97.6 F (36.4 C) (Axillary)   Resp 18   Ht 5\' 11"  (1.803 m)   Wt 74.8 kg   SpO2 100%   BMI 23.00 kg/m  Pain Scale: 0-10   Pain Score: 0-No pain   SpO2: SpO2:  100 % O2 Device:SpO2: 100 % O2 Flow Rate: .   IO: Intake/output summary:  Intake/Output Summary (Last 24 hours) at  09/19/2021 1304 Last data filed at 09/19/2021 8099 Gross per 24 hour  Intake 1574.34 ml  Output --  Net 1574.34 ml    LBM: Last BM Date :  (PTA) Baseline Weight: Weight: 74.8 kg Most recent weight: Weight: 74.8 kg     Palliative Assessment/Data:   Flowsheet Rows    Flowsheet Row Most Recent Value  Intake Tab   Referral Department Hospitalist  Unit at Time of Referral Med/Surg Unit  Palliative Care Primary Diagnosis Other (Comment)  Date Notified 09/18/21  Palliative Care Type New Palliative care  Reason for referral Clarify Goals of Care  Date of Admission 09/17/21  Date first seen by Palliative Care 09/19/21  # of days Palliative referral response time 1 Day(s)  # of days IP prior to Palliative referral 1  Clinical Assessment   Palliative Performance Scale Score 40%  Pain Max last 24 hours Not able to report  Pain Min Last 24 hours Not able to report  Dyspnea Max Last 24 Hours Not able to report  Dyspnea Min Last 24 hours Not able to report  Psychosocial & Spiritual Assessment   Palliative Care Outcomes        Time In: 1330 Time Out: 1445 Time Total: 75 minutes  Greater than 50%  of this time was spent counseling and coordinating care related to the above assessment and plan.  Signed by: Drue Novel, NP   Please contact Palliative Medicine Team phone at [phone] for questions and concerns.  For individual provider: See Shea Evans

## 2021-09-19 NOTE — Progress Notes (Signed)
Initial Nutrition Assessment  DOCUMENTATION CODES:   Not applicable  INTERVENTION:   -Obtain wt -30 ml Prosource Plus TID, each supplement provides 100 kcals and 15 grams protein -Ensure Enlive po TID, each supplement provides 350 kcal and 20 grams of protein -Magic cup TID with meals, each supplement provides 290 kcal and 9 grams of protein  -MVI with minerals daily -Liberalize diet to regular for widest variety of meal selections -Feeding assistance with meals  NUTRITION DIAGNOSIS:   Inadequate oral intake related to poor appetite as evidenced by per patient/family report, meal completion < 25%.  GOAL:   Patient will meet greater than or equal to 90% of their needs  MONITOR:   PO intake, Supplement acceptance  REASON FOR ASSESSMENT:   Consult Assessment of nutrition requirement/status  ASSESSMENT:   Pt with medical history significant of stage 3b CKD; dementia; DM; HTN; and pacemaker placement presenting with decreased appetite, combativeness.  Pt admitted with AKI.   Reviewed I/O's: +1.6 L x 24 hours and +2.1 L since admission   Pt unavailable at time of visit. Attempted to speak with pt via call to hospital room phone, however, unable to reach. RD unable to obtain further nutrition-related history or complete nutrition-focused physical exam at this time.    Per H&P, pt has experienced a general decline in health secondary to dementia, including hyperglycemia, not eating, not taking medications, and combativeness.   Pt is currently oh a heart healthy, carb modified diet. Noted meal completions 0%.   Reviewed wt hx; noted distant history of weight loss. Pt wt has been stable over the past 6 months, however, RD questions accuracy of most recent wt (possibly stated weight). RD will obtain weight reading to better assess weight changes.   Medications reviewed and include colace and lactated ringers infusion @ 75 ml/hr.   Palliative care consult pending for goals of  care discussions. Artificial means of nutrition and hydration are not recommend in patients with advanced age and dementia, as this would not improve pt's quality of life.   Lab Results  Component Value Date   HGBA1C 5.9 (H) 01/13/2018   PTA DM medications are 1000 mg metformin BID.   Labs reviewed: CBGS: 101-114 (inpatient orders for glycemic control are 0-9 units insulin aspart TID with meals).    Diet Order:   Diet Order             Diet heart healthy/carb modified Room service appropriate? Yes; Fluid consistency: Thin  Diet effective now                   EDUCATION NEEDS:   No education needs have been identified at this time  Skin:  Skin Assessment: Reviewed RN Assessment  Last BM:  Unknown  Height:   Ht Readings from Last 1 Encounters:  09/17/21 5\' 11"  (1.803 m)    Weight:   Wt Readings from Last 1 Encounters:  09/17/21 74.8 kg    Ideal Body Weight:  78.2 kg  BMI:  Body mass index is 23 kg/m.  Estimated Nutritional Needs:   Kcal:  1850-2050  Protein:  90-105 grams  Fluid:  > 1.8 L    Loistine Chance, RD, LDN, [place] Registered Dietitian II Certified Diabetes Care and Education Specialist Please refer to [facility] for RD and/or RD on-call/weekend/after hours pager

## 2021-09-19 NOTE — Evaluation (Signed)
Clinical/Bedside Swallow Evaluation Patient Details  Name: Ricky Barnett MRN: [id number] Date of Birth: 06-Dec-1940  Today's Date: 09/19/2021 Time: SLP Start Time (ACUTE ONLY): 1414 SLP Stop Time (ACUTE ONLY): 1430 SLP Time Calculation (min) (ACUTE ONLY): 16 min  Past Medical History:  Past Medical History:  Diagnosis Date   Angina    just started recently ,2-3 days ago-went to see Dr. Einar Gip 05/14/2011   Arthritis    bil knees   Chronic kidney disease    Dementia ([place])    Diabetes mellitus    Type II   Dysrhythmia    2-3 yrs ago   Headache(784.0)    occas   Hypertension    Left ventricular dysfunction    Mastoiditis    right ear   Pacemaker 11/29/2012   dual chamber   Palpitations    Past Surgical History:  Past Surgical History:  Procedure Laterality Date   BIV UPGRADE N/A 12/04/2017   Procedure: Dual Chamber PPM upgrading to BIV PPM;  Surgeon: Evans Lance, MD;  Location: [place] [facility];  Service: Cardiovascular;  Laterality: N/A;   CYSTECTOMY     under chin [place]     states he cut finger off when cutting wood   INSERT / REPLACE / REMOVE PACEMAKER  11/29/2012   [employer]    NASAL SINUS SURGERY     left side   PACEMAKER LEAD REMOVAL N/A 01/15/2018   Procedure: PACEMAKER LEAD REMOVAL,REMOVE ONE LEAD,OCCLUDED ACCESS,PUT IN BIVENTRICULAR PACEMAKER;  Surgeon: Evans Lance, MD;  Location: [place];  Service: Cardiovascular;  Laterality: N/A;   PERMANENT PACEMAKER INSERTION N/A 11/29/2012   Procedure: PERMANENT PACEMAKER INSERTION;  Surgeon: Sanda Klein, MD;  Location: [place] [facility];  Service: Cardiovascular;  Laterality: N/A;   TYMPANOPLASTY  05/19/2011   Procedure: TYMPANOPLASTY;  Surgeon: Thornell Sartorius, MD;  Location: [place];  Service: ENT;  Laterality: Right;  Exploratory Tympanotomy   HPI:  Ricky Barnett is a 81 y.o. male admitted 7/18 presenting with decreased appetite, combativeness.  His wife has noticed his blood sugar has been up,  has not been eating or taking his medications like he should.  He has lost a lot of weight.  His dementia is getting worse.  He is getting more physical. PMH:  stage 3b CKD; dementia; DM; HTN; and pacemaker placement    Assessment / Plan / Recommendation  Clinical Impression  Pt has what appears to be a cognitively-based dysphagia, including oral holding with mild oral residue. Pt responded well to tactile/visual cueing to use liquid wash, with Min verbal cues also given to swallow. He has no overt s/s of aspiration, but staff and family also endorse observing oral deficits. Education initiated with family about cognitive impact on swallowing. It sounds like most of the foods he is eating at home are very soft - pureed or cut into very small pieces. Will start with Dys 2 diet and thin liquids, but SLP will continue to follow for ongoing education and tolerance of modified diet. SLP Visit Diagnosis: Dysphagia, unspecified (R13.10)    Aspiration Risk  Mild aspiration risk;Risk for inadequate nutrition/hydration    Diet Recommendation Dysphagia 2 (Fine chop);Thin liquid   Liquid Administration via: Cup;Straw Medication Administration: Crushed with puree Supervision: Staff to assist with self feeding;Full supervision/cueing for compensatory strategies Compensations: Minimize environmental distractions;Slow rate;Small sips/bites;Follow solids with liquid;Other (Comment) (clean mouth after POs, check for oral residue through meal) Postural Changes: Seated upright at 90  degrees;Remain upright for at least 30 minutes after po intake    Other  Recommendations Oral Care Recommendations: Oral care BID    Recommendations for follow up therapy are one component of a multi-disciplinary discharge planning process, led by the attending physician.  Recommendations may be updated based on patient status, additional functional criteria and insurance authorization.  Follow up Recommendations Skilled nursing-short  term rehab (<3 hours/day)      Assistance Recommended at Discharge Frequent or constant Supervision/Assistance  Functional Status Assessment Patient has had a recent decline in their functional status and demonstrates the ability to make significant improvements in function in a reasonable and predictable amount of time.  Frequency and Duration min 2x/week  2 weeks       Prognosis Prognosis for Safe Diet Advancement: Good Barriers to Reach Goals: Cognitive deficits      Swallow Study   General HPI: Ricky Barnett is a 81 y.o. male admitted 7/18 presenting with decreased appetite, combativeness.  His wife has noticed his blood sugar has been up, has not been eating or taking his medications like he should.  He has lost a lot of weight.  His dementia is getting worse.  He is getting more physical. PMH:  stage 3b CKD; dementia; DM; HTN; and pacemaker placement Type of Study: Bedside Swallow Evaluation Previous Swallow Assessment: none in chart Diet Prior to this Study: Regular;Thin liquids Temperature Spikes Noted: No Respiratory Status: Room air History of Recent Intubation: No Behavior/Cognition: Alert;Cooperative;Pleasant mood;Confused;Requires cueing Oral Cavity Assessment:  (wouldn;t let me assess) Oral Care Completed by SLP: No Oral Cavity - Dentition: Adequate natural dentition;Poor condition Vision: Functional for self-feeding Self-Feeding Abilities: Able to feed self Patient Positioning: Other (comment) (EOB) Baseline Vocal Quality: Normal    Oral/Motor/Sensory Function     Ice Chips Ice chips: Not tested   Thin Liquid Thin Liquid: Impaired Presentation: Self Fed;Straw Oral Phase Functional Implications: Oral holding    Nectar Thick Nectar Thick Liquid: Not tested   Honey Thick Honey Thick Liquid: Not tested   Puree Puree: Impaired Presentation: Self Fed;Spoon Oral Phase Functional Implications: Oral holding   Solid     Solid: Impaired Presentation: Self Fed Oral  Phase Functional Implications: Oral holding      Osie Bond., M.A. [place] Office [phone]  Secure chat preferred  09/19/2021,3:14 PM

## 2021-09-19 NOTE — Evaluation (Signed)
Speech Language Pathology Evaluation Patient Details Name: Ricky Barnett MRN: [id number] DOB: 30-Dec-1940 Today's Date: 09/19/2021 Time: 4401-0272 SLP Time Calculation (min) (ACUTE ONLY): 21 min  Problem List:  Patient Active Problem List   Diagnosis Date Noted   AKI (acute kidney injury) ([place]) 09/18/2021   Failure to thrive in adult 09/18/2021   Elevated LFTs 09/18/2021   DNR (do not resuscitate) 09/18/2021   Cognitive dysfunction 02/03/2018   Acute on chronic combined systolic (congestive) and diastolic (congestive) heart failure ([place]) 12/08/2017   Chronic combined systolic and diastolic heart failure ([place]) 12/04/2017   Paroxysmal atrial fibrillation ([place]) 10/31/2017   Acute on chronic combined systolic and diastolic CHF (congestive heart failure) ([place]) 04/23/2017   Acute cholecystitis 10/09/2016   Colitis 06/12/2016   Nonischemic cardiomyopathy ([place]) 10/14/2014   Erectile dysfunction 01/09/2013   Pacemaker 11/30/2012   NSVT (nonsustained ventricular tachycardia) ([place]) 11/13/2012   AV block, Mobitz 1, with HR to 39 10/25/2012   AV junctional tachycardia ([place]) 10/25/2012   Essential hypertension 10/18/2012   Dyslipidemia 10/18/2012   Chest pain 10/18/2012   Non-insulin dependent type 2 diabetes mellitus ([place]) 10/18/2012   First degree AV block 10/18/2012   Palpitations 10/18/2012   Past Medical History:  Past Medical History:  Diagnosis Date   Angina    just started recently ,2-3 days ago-went to see Dr. Einar Gip 05/14/2011   Arthritis    bil knees   Chronic kidney disease    Dementia ([place])    Diabetes mellitus    Type II   Dysrhythmia    2-3 yrs ago   Headache(784.0)    occas   Hypertension    Left ventricular dysfunction    Mastoiditis    right ear   Pacemaker 11/29/2012   dual chamber   Palpitations    Past Surgical History:  Past Surgical History:  Procedure Laterality Date   BIV UPGRADE N/A 12/04/2017   Procedure: Dual Chamber PPM upgrading to BIV PPM;   Surgeon: Evans Lance, MD;  Location: [facility];  Service: Cardiovascular;  Laterality: N/A;   CYSTECTOMY     under chin [place]     states he cut finger off when cutting wood   INSERT / REPLACE / REMOVE PACEMAKER  11/29/2012   [employer]    NASAL SINUS SURGERY     left side   PACEMAKER LEAD REMOVAL N/A 01/15/2018   Procedure: PACEMAKER LEAD REMOVAL,REMOVE ONE LEAD,OCCLUDED ACCESS,PUT IN BIVENTRICULAR PACEMAKER;  Surgeon: Evans Lance, MD;  Location: [facility];  Service: Cardiovascular;  Laterality: N/A;   PERMANENT PACEMAKER INSERTION N/A 11/29/2012   Procedure: PERMANENT PACEMAKER INSERTION;  Surgeon: Sanda Klein, MD;  Location: [facility];  Service: Cardiovascular;  Laterality: N/A;   TYMPANOPLASTY  05/19/2011   Procedure: TYMPANOPLASTY;  Surgeon: Thornell Sartorius, MD;  Location: [facility];  Service: ENT;  Laterality: Right;  Exploratory Tympanotomy   HPI:  Ricky Barnett is a 81 y.o. male admitted 7/18 presenting with decreased appetite, combativeness.  His wife has noticed his blood sugar has been up, has not been eating or taking his medications like he should.  He has lost a lot of weight.  His dementia is getting worse.  He is getting more physical. PMH:  stage 3b CKD; dementia; DM; HTN; and pacemaker placement   Assessment / Plan / Recommendation Clinical Impression  Pt has cognitive impairments at baseline but with what appears to be an acute decline according to  pt's family. He currently asks some repetitive questions and has reduced functional problem solving and sustained attention. With cues (especially visual/tactile ones) he is easily redirected through simple tasks. He requires cues to follow one-step commands as well. Family present and eduation about current level of function and how to facilitate participation in functional tasks. They would benefit from f/u acutely for ongoing education.    SLP Assessment  SLP Recommendation/Assessment:  Patient needs continued Speech Wise Pathology Services SLP Visit Diagnosis: Cognitive communication deficit (R41.841)    Recommendations for follow up therapy are one component of a multi-disciplinary discharge planning process, led by the attending physician.  Recommendations may be updated based on patient status, additional functional criteria and insurance authorization.    Follow Up Recommendations  Skilled nursing-short term rehab (<3 hours/day)    Assistance Recommended at Discharge  Frequent or constant Supervision/Assistance  Functional Status Assessment Patient has had a recent decline in their functional status and demonstrates the ability to make significant improvements in function in a reasonable and predictable amount of time.  Frequency and Duration min 2x/week  2 weeks      SLP Evaluation Cognition  Overall Cognitive Status: Impaired/Different from baseline Arousal/Alertness: Awake/alert Orientation Level: Oriented to person Attention: Sustained Sustained Attention: Impaired Sustained Attention Impairment: Verbal basic Memory: Impaired Memory Impairment: Decreased recall of new information Awareness: Impaired Awareness Impairment: Emergent impairment;Intellectual impairment Problem Solving: Impaired Problem Solving Impairment: Functional basic Behaviors: Impulsive Safety/Judgment: Impaired       Comprehension  Auditory Comprehension Overall Auditory Comprehension: Impaired Conversation: Simple    Expression Expression Primary Mode of Expression: Verbal Verbal Expression Overall Verbal Expression: Other (comment) (limited content but appropriate language) Written Expression Dominant Hand: Right   Oral / Motor  Motor Speech Overall Motor Speech: Appears within functional limits for tasks assessed            Osie Bond., M.A. [place] Office [phone]  Secure chat preferred  09/19/2021, 3:19 PM

## 2021-09-19 NOTE — Evaluation (Signed)
Occupational Therapy Evaluation Patient Details Name: Ricky Barnett MRN: [id number] DOB: Jul 14, 1940 Today's Date: 09/19/2021   History of Present Illness Ricky Barnett is a 81 y.o. male admitted 7/18 presenting with decreased appetite, combativeness.  His wife has noticed his blood sugar has been up, has not been eating or taking his medications like he should.  He has lost a lot of weight.  His dementia is getting worse.  He is getting more physical. PMH:  stage 3b CKD; dementia; DM; HTN; and pacemaker placement   Clinical Impression   PTA, pt lives with spouse, typically ambulatory with a cane and receiving light assist with ADLs though has become progressive weaker. Recently, wife has been providing up to Total A for ADLs and heavier assist for transfers/use of wheelchair. Pt presents now with diagnoses above and pleasantly confused this afternoon (A&Ox1). Overall, pt requires Min A for bed mobility and side steps at bedside using RW. Pt fatigued quickly, often reporting "I dont feel good". With sequencing cues, pt able to participate in ADLs with Min A for UB ADL and overall Mod A for LB ADLs. Pt also questionably holding food in mouth as particles noted to drip out of mouth by end of session - collab with SLP and secure chat sent to MD for swallow eval request. Per chart, due to increasing difficulty caring for pt at home, family interested in SNF at [place].       Recommendations for follow up therapy are one component of a multi-disciplinary discharge planning process, led by the attending physician.  Recommendations may be updated based on patient status, additional functional criteria and insurance authorization.   Follow Up Recommendations  Skilled nursing-short term rehab (<3 hours/day)    Assistance Recommended at Discharge Frequent or constant Supervision/Assistance  Patient can return home with the following A little help with walking and/or transfers;A lot of help with  bathing/dressing/bathroom;Direct supervision/assist for medications management    Functional Status Assessment  Patient has had a recent decline in their functional status and demonstrates the ability to make significant improvements in function in a reasonable and predictable amount of time.  Equipment Recommendations  None recommended by OT    Recommendations for Other Services       Precautions / Restrictions Precautions Precautions: Fall Restrictions Weight Bearing Restrictions: No      Mobility Bed Mobility Overal bed mobility: Needs Assistance Bed Mobility: Supine to Sit, Sit to Supine     Supine to sit: Min guard, HOB elevated Sit to supine: Min assist   General bed mobility comments: able to sit EOB with min guard only to ensure truncal balance, MIn A to initiate back to supine with LE assist    Transfers Overall transfer level: Needs assistance Equipment used: Rolling walker (2 wheels) Transfers: Sit to/from Stand Sit to Stand: Min assist           General transfer comment: placed RW in front of pt with pt confirming familiarity. Very light MIn A to stand at bedside and side step (fatigued quickly)      Balance Overall balance assessment: Needs assistance Sitting-balance support: No upper extremity supported, Feet supported, Single extremity supported Sitting balance-Leahy Scale: Good Sitting balance - Comments: able to bend to doff socks   Standing balance support: Bilateral upper extremity supported Standing balance-Leahy Scale: Poor  ADL either performed or assessed with clinical judgement   ADL Overall ADL's : Needs assistance/impaired Eating/Feeding: Set up;Sitting   Grooming: Supervision/safety;Sitting;Bed level;Wash/dry face Grooming Details (indicate cue type and reason): able to wash face with cues for thoroughness though did need cues to wipe mouth when secretions (from food) noted to drip out during  session Upper Body Bathing: Minimal assistance;Sitting   Lower Body Bathing: Moderate assistance;Sitting/lateral leans;Sit to/from stand   Upper Body Dressing : Minimal assistance;Sitting   Lower Body Dressing: Moderate assistance;Sit to/from stand Lower Body Dressing Details (indicate cue type and reason): pt able to assist in doffing personal socks by bending to feet. OT assisting in donning socks d/t pt fatigue Toilet Transfer: Minimal assistance;Stand-pivot;Rolling walker (2 wheels)   Toileting- Clothing Manipulation and Hygiene: Moderate assistance;Sitting/lateral lean;Sit to/from stand Toileting - Clothing Manipulation Details (indicate cue type and reason): ``       General ADL Comments: Limited primarily by baseline dementia with redirection and reorientation needed. Pt able to stand fairly well and assist with ADLs though benefits from sequencing cues     Vision Ability to See in Adequate Light: 0 Adequate Patient Visual Report: No change from baseline Vision Assessment?: No apparent visual deficits     Perception     Praxis      Pertinent Vitals/Pain Pain Assessment Pain Assessment: Faces Faces Pain Scale: Hurts a little bit Pain Descriptors / Indicators: Headache Pain Intervention(s): Monitored during session     Hand Dominance Right   Extremity/Trunk Assessment Upper Extremity Assessment Upper Extremity Assessment: Overall WFL for tasks assessed   Lower Extremity Assessment Lower Extremity Assessment: Defer to PT evaluation   Cervical / Trunk Assessment Cervical / Trunk Assessment: Normal   Communication Communication Communication: No difficulties   Cognition Arousal/Alertness: Awake/alert Behavior During Therapy: WFL for tasks assessed/performed Overall Cognitive Status: History of cognitive impairments - at baseline Area of Impairment: Memory, Orientation, Following commands, Safety/judgement, Awareness, Problem solving, Attention                  Orientation Level: Disoriented to, Place, Time, Situation Current Attention Level: Sustained Memory: Decreased short-term memory Following Commands: Follows one step commands inconsistently, Follows one step commands with increased time Safety/Judgement: Decreased awareness of safety, Decreased awareness of deficits Awareness: Intellectual Problem Solving: Slow processing, Requires tactile cues, Requires verbal cues General Comments: hx of dementia that has been worsening at home recently. A&Ox1 (did incorrectly report being 71 instead of 81) pt pleasant, attempted all tasks though requires reorientation to place, situation as pt asking about wife throughout session (attempted to call via phone though no answer). Despite cues to scan environment, pt unable to state hospital (reported it looked like a factory). Pt unsure of why he is in hospital requiring education.     General Comments  Was noted to pull at condom cath but easily educated and redirected    Exercises     Shoulder Instructions      Home Living Family/patient expects to be discharged to:: Private residence Living Arrangements: Spouse/significant other Available Help at Discharge: Family;Available 24 hours/day Type of Home: House Home Access: Stairs to enter [employer] of Steps: 4 Entrance Stairs-Rails: Right;Left;Can reach both Home Layout: One level     Bathroom Shower/Tub: Tub/shower unit;Sponge bathes at baseline   [employer]: Standard     Home Equipment: Cane - single [employer]ista (2 wheels);Shower seat;Wheelchair - manual   Additional Comments: home information gleaned from PT eval as no family present during OT  eval      Prior Functioning/Environment Prior Level of Function : Needs assist;Patient poor historian/Family not available             Mobility Comments: Wife states pt was using cane and was able to ambulate on own until last month she has to provide mod to max  assist at times and sometimes has to use wheelchair. ADLs Comments: total assist by wife for sponge bathing and dressing.        OT Problem List: Decreased strength;Decreased activity tolerance;Impaired balance (sitting and/or standing);Decreased cognition;Decreased safety awareness;Decreased knowledge of use of DME or AE      OT Treatment/Interventions: Self-care/ADL training;Therapeutic exercise;DME and/or AE instruction;Therapeutic activities    OT Goals(Current goals can be found in the care plan section) Acute Rehab OT Goals Patient Stated Goal: see my wife OT Goal Formulation: With patient Time For Goal Achievement: 10/03/21 Potential to Achieve Goals: Good  OT Frequency: Min 2X/week    Co-evaluation              AM-PAC OT "6 Clicks" Daily Activity     Outcome Measure Help from another person eating meals?: A Little Help from another person taking care of personal grooming?: A Little Help from another person toileting, which includes using toliet, bedpan, or urinal?: A Lot Help from another person bathing (including washing, rinsing, drying)?: A Lot Help from another person to put on and taking off regular upper body clothing?: A Little Help from another person to put on and taking off regular lower body clothing?: A Lot 6 Click Score: 15   End of Session Equipment Utilized During Treatment: Rolling walker (2 wheels) Nurse Communication: Mobility status  Activity Tolerance: Patient tolerated treatment well;Patient limited by fatigue Patient left: in bed;with call bell/phone within reach;with bed alarm set  OT Visit Diagnosis: Unsteadiness on feet (R26.81);Other symptoms and signs involving cognitive function                Time: 1856-3149 OT Time Calculation (min): 17 min Charges:  OT General Charges $OT Visit: 1 Visit OT Evaluation $OT Eval Moderate Complexity: 1 Mod  Malachy Chamber, OTR/L Acute Rehab Services Office: [phone]   Layla Maw 09/19/2021, 1:54  PM

## 2021-09-20 DIAGNOSIS — N179 Acute kidney failure, unspecified: Secondary | ICD-10-CM | POA: Diagnosis not present

## 2021-09-20 DIAGNOSIS — E785 Hyperlipidemia, unspecified: Secondary | ICD-10-CM | POA: Diagnosis not present

## 2021-09-20 DIAGNOSIS — I5042 Chronic combined systolic (congestive) and diastolic (congestive) heart failure: Secondary | ICD-10-CM | POA: Diagnosis not present

## 2021-09-20 DIAGNOSIS — R7989 Other specified abnormal findings of blood chemistry: Secondary | ICD-10-CM | POA: Diagnosis not present

## 2021-09-20 LAB — GLUCOSE, CAPILLARY
Glucose-Capillary: 110 mg/dL — ABNORMAL HIGH (ref 70–99)
Glucose-Capillary: 118 mg/dL — ABNORMAL HIGH (ref 70–99)
Glucose-Capillary: 132 mg/dL — ABNORMAL HIGH (ref 70–99)

## 2021-09-20 LAB — COMPREHENSIVE METABOLIC PANEL
ALT: 776 U/L — ABNORMAL HIGH (ref 0–44)
AST: 434 U/L — ABNORMAL HIGH (ref 15–41)
Albumin: 2.9 g/dL — ABNORMAL LOW (ref 3.5–5.0)
Alkaline Phosphatase: 198 U/L — ABNORMAL HIGH (ref 38–126)
Anion gap: 11 (ref 5–15)
BUN: 60 mg/dL — ABNORMAL HIGH (ref 8–23)
CO2: 20 mmol/L — ABNORMAL LOW (ref 22–32)
Calcium: 8.5 mg/dL — ABNORMAL LOW (ref 8.9–10.3)
Chloride: 111 mmol/L (ref 98–111)
Creatinine, Ser: 2.74 mg/dL — ABNORMAL HIGH (ref 0.61–1.24)
GFR, Estimated: 22 mL/min — ABNORMAL LOW (ref >60)
Glucose, Bld: 128 mg/dL — ABNORMAL HIGH (ref 70–99)
Potassium: 4.3 mmol/L (ref 3.5–5.1)
Sodium: 142 mmol/L (ref 135–145)
Total Bilirubin: 2.6 mg/dL — ABNORMAL HIGH (ref 0.3–1.2)
Total Protein: 6.1 g/dL — ABNORMAL LOW (ref 6.5–8.1)

## 2021-09-20 NOTE — Progress Notes (Signed)
PROGRESS NOTE    Ricky Barnett  KWI:[id number] DOB: [birth year] DOA: 09/17/2021 PCP: Merrilee Seashore, MD   Brief Narrative: Ricky Barnett is a 81 y.o. male with a history of CKD stage IIIb, dementia, diabetes mellitus, hypertension and atrial fibrillation s/p pacemaker placement. Patient presented secondary to decreased appetite and combativeness in setting of dementia. Patient noted to have evidence of AKI and possible shock liver.   Assessment and Plan:  AKI on CKD stage IIIb Baseline creatinine of about 2. Creatine of 3.12 on admission. Complicated by hypotension and Entresto usage. Entresto held. IV fluids initiated. Creatinine of 2.74 today. -Continue IV fluids -Strict in/out and daily weights  Elevated LFTs AST/ALT of 1079/998 with associated total bilirubin of 2.4 an alkaline phosphatase of 249 on admission. Possibly secondary to shock liver from hypotension. Improving AST/ALT with increased total bilirubin. CT abdomen/pelvis without hepatobiliary etiology noted. Improving. -Daily CMP  Failure to thrive Thought to be secondary to dementia. Palliative care consulted. Dietitian consulted.   Dysphagia Speech therapy consulted and recommend a dysphagia 2 diet.  Dementia Noted. Patient has 24/7 caregiver assistance. PT/OT recommending SNF. -Continue Namenda  Diabetes mellitus, type 2 Patient is on metformin as an outpatient -Continue SSI  Primary hypertension -Continue Toprol XL and hold Entresto  Hyperlipidemia Pravastatin held secondary to elevated AST/ALT  Atrial fibrillation -Continue Eliquis. Patient is s/p pacemaker placement. He is on Toprol XL as an outpatient for rate control which was held on admission because of bradycardia. -Continue Toprol XL  Chronic combined systolic and diastolic heart failure Most recent Transthoracic Echocardiogram significant for EF of 20% with grade 2 diastolic dysfunction from 2019. Patient is on Entresto and Toprol XL as an  outpatient, which were held on admission. -Strict in/out -Daily weights  DVT prophylaxis: Eliquis Code Status:   Code Status: DNR Family Communication: None at bedside Disposition Plan: Discharge pending PT/OT recommendations and improvement of AKI   Consultants:  Palliative care medicine  Procedures:  None  Antimicrobials: None    Subjective: Patient without concern this morning.  Objective: BP 117/68 (BP Location: Left Arm)   Pulse 80   Temp 98.2 F (36.8 C) (Axillary)   Resp 19   Ht 5\' 11"  (1.803 m)   Wt 73.3 kg   SpO2 94%   BMI 22.54 kg/m   Examination:  General exam: Appears calm and comfortable Respiratory system: Clear to auscultation. Respiratory effort normal. Cardiovascular system: S1 & S2 heard, RRR. Gastrointestinal system: Abdomen is nondistended, soft and nontender. No organomegaly or masses felt. Normal bowel sounds heard. Central nervous system: Alert and oriented to self only. Musculoskeletal: No calf tenderness   Data Reviewed: I have personally reviewed following labs and imaging studies  CBC Lab Results  Component Value Date   WBC 10.3 09/19/2021   RBC 3.43 (L) 09/19/2021   HGB 8.9 (L) 09/19/2021   HCT 27.6 (L) 09/19/2021   MCV 80.5 09/19/2021   MCH 25.9 (L) 09/19/2021   PLT 283 09/19/2021   MCHC 32.2 09/19/2021   RDW 16.3 (H) 09/19/2021   LYMPHSABS 0.9 09/18/2021   MONOABS 0.9 09/18/2021   EOSABS 0.0 09/18/2021   BASOSABS 0.0 24/26/8341     Last metabolic panel Lab Results  Component Value Date   NA 142 09/20/2021   K 4.3 09/20/2021   CL 111 09/20/2021   CO2 20 (L) 09/20/2021   BUN 60 (H) 09/20/2021   CREATININE 2.74 (H) 09/20/2021   GLUCOSE 128 (H) 09/20/2021   GFRNONAA 22 (L)  09/20/2021   GFRAA 35 (L) 01/01/2018   CALCIUM 8.5 (L) 09/20/2021   PROT 6.1 (L) 09/20/2021   ALBUMIN 2.9 (L) 09/20/2021   BILITOT 2.6 (H) 09/20/2021   ALKPHOS 198 (H) 09/20/2021   AST 434 (H) 09/20/2021   ALT 776 (H) 09/20/2021    ANIONGAP 11 09/20/2021    GFR: Estimated Creatinine Clearance: 21.6 mL/min (A) (by C-G formula based on SCr of 2.74 mg/dL (H)).  No results found for this or any previous visit (from the past 240 hour(s)).    Radiology Studies: No results found.    LOS: 2 days    Cordelia Poche, MD [facility] 09/20/2021, 2:27 PM   If 7PM-7AM, please contact night-coverage [email or web address]

## 2021-09-21 ENCOUNTER — Inpatient Hospital Stay (HOSPITAL_COMMUNITY): Payer: Medicare Other

## 2021-09-21 DIAGNOSIS — R7989 Other specified abnormal findings of blood chemistry: Secondary | ICD-10-CM | POA: Diagnosis not present

## 2021-09-21 DIAGNOSIS — E785 Hyperlipidemia, unspecified: Secondary | ICD-10-CM | POA: Diagnosis not present

## 2021-09-21 DIAGNOSIS — N179 Acute kidney failure, unspecified: Secondary | ICD-10-CM | POA: Diagnosis not present

## 2021-09-21 DIAGNOSIS — I5042 Chronic combined systolic (congestive) and diastolic (congestive) heart failure: Secondary | ICD-10-CM | POA: Diagnosis not present

## 2021-09-21 LAB — BASIC METABOLIC PANEL
Anion gap: 13 (ref 5–15)
BUN: 77 mg/dL — ABNORMAL HIGH (ref 8–23)
CO2: 20 mmol/L — ABNORMAL LOW (ref 22–32)
Calcium: 8.3 mg/dL — ABNORMAL LOW (ref 8.9–10.3)
Chloride: 112 mmol/L — ABNORMAL HIGH (ref 98–111)
Creatinine, Ser: 2.79 mg/dL — ABNORMAL HIGH (ref 0.61–1.24)
GFR, Estimated: 22 mL/min — ABNORMAL LOW (ref >60)
Glucose, Bld: 125 mg/dL — ABNORMAL HIGH (ref 70–99)
Potassium: 4.9 mmol/L (ref 3.5–5.1)
Sodium: 145 mmol/L (ref 135–145)

## 2021-09-21 LAB — COMPREHENSIVE METABOLIC PANEL
ALT: UNDETERMINED U/L (ref 0–44)
AST: 292 U/L — ABNORMAL HIGH (ref 15–41)
Albumin: 3 g/dL — ABNORMAL LOW (ref 3.5–5.0)
Alkaline Phosphatase: 198 U/L — ABNORMAL HIGH (ref 38–126)
Anion gap: 15 (ref 5–15)
BUN: 71 mg/dL — ABNORMAL HIGH (ref 8–23)
CO2: 14 mmol/L — ABNORMAL LOW (ref 22–32)
Calcium: 8.2 mg/dL — ABNORMAL LOW (ref 8.9–10.3)
Chloride: 108 mmol/L (ref 98–111)
Creatinine, Ser: 2.88 mg/dL — ABNORMAL HIGH (ref 0.61–1.24)
GFR, Estimated: 21 mL/min — ABNORMAL LOW (ref >60)
Glucose, Bld: 117 mg/dL — ABNORMAL HIGH (ref 70–99)
Potassium: 4.7 mmol/L (ref 3.5–5.1)
Sodium: 137 mmol/L (ref 135–145)
Total Bilirubin: 2.8 mg/dL — ABNORMAL HIGH (ref 0.3–1.2)
Total Protein: 6.3 g/dL — ABNORMAL LOW (ref 6.5–8.1)

## 2021-09-21 LAB — GLUCOSE, CAPILLARY
Glucose-Capillary: 99 mg/dL (ref 70–99)
Glucose-Capillary: 95 mg/dL (ref 70–99)
Glucose-Capillary: 123 mg/dL — ABNORMAL HIGH (ref 70–99)
Glucose-Capillary: 123 mg/dL — ABNORMAL HIGH (ref 70–99)
Glucose-Capillary: 152 mg/dL — ABNORMAL HIGH (ref 70–99)

## 2021-09-21 LAB — BRAIN NATRIURETIC PEPTIDE: B Natriuretic Peptide: >4500 pg/mL — ABNORMAL HIGH (ref 0.0–100.0)

## 2021-09-21 NOTE — Progress Notes (Signed)
PROGRESS NOTE    Ricky Barnett  MPN:[id number] DOB: Nov 24, 1940 DOA: 09/17/2021 PCP: Merrilee Seashore, MD   Brief Narrative: Ricky Barnett is a 81 y.o. male with a history of CKD stage IIIb, dementia, diabetes mellitus, hypertension and atrial fibrillation s/p pacemaker placement. Patient presented secondary to decreased appetite and combativeness in setting of dementia. Patient noted to have evidence of AKI and possible shock liver.   Assessment and Plan:  AKI on CKD stage IIIb Baseline creatinine of about 2. Creatine of 3.12 on admission. Complicated by hypotension and Entresto usage. Entresto held. IV fluids initiated. Creatinine increased to 2.88 today. Patient does not appear to be clinically overloaded; complicated by heart failure. -Continue IV fluids; may need to discontinue -Strict in/out and daily weights -Repeat BMP; BNP -Renal ultrasound  Elevated LFTs AST/ALT of 1079/998 with associated total bilirubin of 2.4 an alkaline phosphatase of 249 on admission. Possibly secondary to shock liver from hypotension. Improving AST/ALT with increased total bilirubin. CT abdomen/pelvis without hepatobiliary etiology noted. Improving. -Daily CMP  Failure to thrive Thought to be secondary to dementia. Palliative care consulted. Dietitian consulted.   Dysphagia Speech therapy consulted and recommend a dysphagia 2 diet.  Dementia Noted. Patient has 24/7 caregiver assistance. PT/OT recommending SNF. -Continue Namenda  Diabetes mellitus, type 2 Patient is on metformin as an outpatient -Continue SSI  Primary hypertension -Continue Toprol XL and hold Entresto  Hyperlipidemia Pravastatin held secondary to elevated AST/ALT  Atrial fibrillation -Continue Eliquis. Patient is s/p pacemaker placement. He is on Toprol XL as an outpatient for rate control which was held on admission because of bradycardia. -Continue Toprol XL  Chronic combined systolic and diastolic heart  failure Most recent Transthoracic Echocardiogram significant for EF of 20% with grade 2 diastolic dysfunction from 2019. Patient is on Entresto and Toprol XL as an outpatient, which were held on admission. -Strict in/out -Daily weights  DVT prophylaxis: Eliquis Code Status:   Code Status: DNR Family Communication: Wife on telephone Disposition Plan: Discharge pending PT/OT recommendations and improvement of AKI   Consultants:  Palliative care medicine  Procedures:  None  Antimicrobials: None    Subjective: No issues per patient.  Objective: BP 111/76 (BP Location: Left Arm)   Pulse 84   Temp (!) 97.5 F (36.4 C) (Oral)   Resp 18   Ht 5\' 11"  (1.803 m)   Wt 73 kg   SpO2 (!) 82%   BMI 22.45 kg/m   Examination:  General exam: Appears calm and comfortable Respiratory system: Clear to auscultation. Respiratory effort normal. Cardiovascular system: S1 & S2 heard, RRR. No murmurs, rubs, gallops or clicks. Gastrointestinal system: Abdomen is nondistended, soft and nontender. No organomegaly or masses felt. Normal bowel sounds heard. Central nervous system: Alert and oriented to self. Musculoskeletal: No edema. No calf tenderness Skin: No cyanosis. No rashes Psychiatry: Judgement and insight appear impaired. Mood & affect appropriate.    Data Reviewed: I have personally reviewed following labs and imaging studies  CBC Lab Results  Component Value Date   WBC 10.3 09/19/2021   RBC 3.43 (L) 09/19/2021   HGB 8.9 (L) 09/19/2021   HCT 27.6 (L) 09/19/2021   MCV 80.5 09/19/2021   MCH 25.9 (L) 09/19/2021   PLT 283 09/19/2021   MCHC 32.2 09/19/2021   RDW 16.3 (H) 09/19/2021   LYMPHSABS 0.9 09/18/2021   MONOABS 0.9 09/18/2021   EOSABS 0.0 09/18/2021   BASOSABS 0.0 00/86/7619     Last metabolic panel Lab Results  Component  Value Date   NA 137 09/21/2021   K 4.7 09/21/2021   CL 108 09/21/2021   CO2 14 (L) 09/21/2021   BUN 71 (H) 09/21/2021   CREATININE 2.88 (H)  09/21/2021   GLUCOSE 117 (H) 09/21/2021   GFRNONAA 21 (L) 09/21/2021   GFRAA 35 (L) 01/01/2018   CALCIUM 8.2 (L) 09/21/2021   PROT 6.3 (L) 09/21/2021   ALBUMIN 3.0 (L) 09/21/2021   BILITOT 2.8 (H) 09/21/2021   ALKPHOS 198 (H) 09/21/2021   AST 292 (H) 09/21/2021   ALT QUANTITY NOT SUFFICIENT, UNABLE TO PERFORM TEST 09/21/2021   ANIONGAP 15 09/21/2021    GFR: Estimated Creatinine Clearance: 20.4 mL/min (A) (by C-G formula based on SCr of 2.88 mg/dL (H)).  No results found for this or any previous visit (from the past 240 hour(s)).    Radiology Studies: No results found.    LOS: 3 days    Cordelia Poche, MD [facility] 09/21/2021, 2:24 PM   If 7PM-7AM, please contact night-coverage [email or web address]

## 2021-09-22 DIAGNOSIS — E785 Hyperlipidemia, unspecified: Secondary | ICD-10-CM | POA: Diagnosis not present

## 2021-09-22 DIAGNOSIS — R7989 Other specified abnormal findings of blood chemistry: Secondary | ICD-10-CM | POA: Diagnosis not present

## 2021-09-22 DIAGNOSIS — I5042 Chronic combined systolic (congestive) and diastolic (congestive) heart failure: Secondary | ICD-10-CM | POA: Diagnosis not present

## 2021-09-22 DIAGNOSIS — N179 Acute kidney failure, unspecified: Secondary | ICD-10-CM | POA: Diagnosis not present

## 2021-09-22 LAB — BASIC METABOLIC PANEL
Anion gap: 8 (ref 5–15)
BUN: 77 mg/dL — ABNORMAL HIGH (ref 8–23)
CO2: 24 mmol/L (ref 22–32)
Calcium: 8.5 mg/dL — ABNORMAL LOW (ref 8.9–10.3)
Chloride: 113 mmol/L — ABNORMAL HIGH (ref 98–111)
Creatinine, Ser: 2.61 mg/dL — ABNORMAL HIGH (ref 0.61–1.24)
GFR, Estimated: 24 mL/min — ABNORMAL LOW (ref >60)
Glucose, Bld: 143 mg/dL — ABNORMAL HIGH (ref 70–99)
Potassium: 4.5 mmol/L (ref 3.5–5.1)
Sodium: 145 mmol/L (ref 135–145)

## 2021-09-22 LAB — COMPREHENSIVE METABOLIC PANEL
ALT: 554 U/L — ABNORMAL HIGH (ref 0–44)
AST: 156 U/L — ABNORMAL HIGH (ref 15–41)
Albumin: 2.9 g/dL — ABNORMAL LOW (ref 3.5–5.0)
Alkaline Phosphatase: 180 U/L — ABNORMAL HIGH (ref 38–126)
Anion gap: 11 (ref 5–15)
BUN: 85 mg/dL — ABNORMAL HIGH (ref 8–23)
CO2: 19 mmol/L — ABNORMAL LOW (ref 22–32)
Calcium: 8.4 mg/dL — ABNORMAL LOW (ref 8.9–10.3)
Chloride: 115 mmol/L — ABNORMAL HIGH (ref 98–111)
Creatinine, Ser: 3.08 mg/dL — ABNORMAL HIGH (ref 0.61–1.24)
GFR, Estimated: 19 mL/min — ABNORMAL LOW (ref >60)
Glucose, Bld: 132 mg/dL — ABNORMAL HIGH (ref 70–99)
Potassium: 6.1 mmol/L — ABNORMAL HIGH (ref 3.5–5.1)
Sodium: 145 mmol/L (ref 135–145)
Total Bilirubin: 2.7 mg/dL — ABNORMAL HIGH (ref 0.3–1.2)
Total Protein: 5.9 g/dL — ABNORMAL LOW (ref 6.5–8.1)

## 2021-09-22 LAB — GLUCOSE, CAPILLARY
Glucose-Capillary: 119 mg/dL — ABNORMAL HIGH (ref 70–99)
Glucose-Capillary: 126 mg/dL — ABNORMAL HIGH (ref 70–99)
Glucose-Capillary: 132 mg/dL — ABNORMAL HIGH (ref 70–99)
Glucose-Capillary: 137 mg/dL — ABNORMAL HIGH (ref 70–99)

## 2021-09-22 MED ORDER — FUROSEMIDE 10 MG/ML IJ SOLN
40.0000 mg | Freq: Once | INTRAMUSCULAR | Status: AC
Start: 2021-09-22 — End: 2021-09-22
  Administered 2021-09-22: 40 mg via INTRAVENOUS
  Filled 2021-09-22: qty 4

## 2021-09-22 NOTE — Progress Notes (Addendum)
PROGRESS NOTE    Ricky Barnett  TDH:[id number] DOB: December 14, 1940 DOA: 09/17/2021 PCP: Merrilee Seashore, MD   Brief Narrative: Ricky Barnett is a 81 y.o. male with a history of CKD stage IIIb, dementia, diabetes mellitus, hypertension and atrial fibrillation s/p pacemaker placement. Patient presented secondary to decreased appetite and combativeness in setting of dementia. Patient noted to have evidence of AKI and possible shock liver. Patient has been managed on IV fluids now with worsening AKI.   Assessment and Plan:  AKI on CKD stage IIIb Baseline creatinine of about 2. Creatine of 3.12 on admission. Complicated by hypotension and Entresto usage. Entresto held. IV fluids initiated. Creatinine increased to 3.08 today. BNP >4,500. Patient does not appear to be overloaded on exam. He is +5 L although I/O are not completely accurate. Weight has been stable. -Hold IV fluids -Strict in/out and daily weights -Daily C/BMP -Trial Lasix 40 mg IV x1 -If no improvement, consult nephrology  Elevated LFTs AST/ALT of 1079/998 with associated total bilirubin of 2.4 an alkaline phosphatase of 249 on admission. Possibly secondary to shock liver from hypotension. Improving AST/ALT with increased total bilirubin. CT abdomen/pelvis without hepatobiliary etiology noted. Improving. -Daily CMP  Hyperkalemia Likely from worsening AKI. -Lasix as mentioned above -Repeat BMP this afternoon  Failure to thrive Thought to be secondary to dementia. Palliative care consulted. Dietitian consulted.   Dysphagia Speech therapy consulted and recommend a dysphagia 2 diet.  Dementia Noted. Patient has 24/7 caregiver assistance. PT/OT recommending SNF. -Continue Namenda  Diabetes mellitus, type 2 Patient is on metformin as an outpatient -Continue SSI  Primary hypertension -Continue Toprol XL and hold Entresto  Hyperlipidemia Pravastatin held secondary to elevated AST/ALT  Atrial fibrillation -Continue  Eliquis. Patient is s/p pacemaker placement. He is on Toprol XL as an outpatient for rate control which was held on admission because of bradycardia. -Continue Toprol XL  Chronic combined systolic and diastolic heart failure Most recent Transthoracic Echocardiogram significant for EF of 20% with grade 2 diastolic dysfunction from 2019. Patient is on Entresto and Toprol XL as an outpatient, which were held on admission. -Strict in/out -Daily weights  DVT prophylaxis: Eliquis Code Status:   Code Status: DNR Family Communication: None at bedside Disposition Plan: Discharge to SNF pending improvement of AKI   Consultants:  Palliative care medicine  Procedures:  None  Antimicrobials: None    Subjective: No concerns. Patient speaks to me as if he knows me personally.   Objective: BP 129/82 (BP Location: Right Arm)   Pulse 96   Temp 97.8 F (36.6 C) (Oral)   Resp 16   Ht 5\' 11"  (1.803 m)   Wt 72.6 kg   SpO2 91%   BMI 22.32 kg/m   Examination:  General exam: Appears calm and comfortable Respiratory system: Clear to auscultation. Respiratory effort normal. Cardiovascular system: S1 & S2 heard, RRR. Gastrointestinal system: Abdomen is nondistended, soft and nontender. Normal bowel sounds heard. Central nervous system: Alert and oriented to self. Musculoskeletal: No edema. No calf tenderness Skin: No cyanosis. No rashes Psychiatry: Judgement and insight appear impaired. Mood & affect appropriate.    Data Reviewed: I have personally reviewed following labs and imaging studies  CBC Lab Results  Component Value Date   WBC 10.3 09/19/2021   RBC 3.43 (L) 09/19/2021   HGB 8.9 (L) 09/19/2021   HCT 27.6 (L) 09/19/2021   MCV 80.5 09/19/2021   MCH 25.9 (L) 09/19/2021   PLT 283 09/19/2021   MCHC 32.2 09/19/2021  RDW 16.3 (H) 09/19/2021   LYMPHSABS 0.9 09/18/2021   MONOABS 0.9 09/18/2021   EOSABS 0.0 09/18/2021   BASOSABS 0.0 65/53/7482     Last metabolic panel Lab  Results  Component Value Date   NA 145 09/22/2021   K 6.1 (H) 09/22/2021   CL 115 (H) 09/22/2021   CO2 19 (L) 09/22/2021   BUN 85 (H) 09/22/2021   CREATININE 3.08 (H) 09/22/2021   GLUCOSE 132 (H) 09/22/2021   GFRNONAA 19 (L) 09/22/2021   GFRAA 35 (L) 01/01/2018   CALCIUM 8.4 (L) 09/22/2021   PROT 5.9 (L) 09/22/2021   ALBUMIN 2.9 (L) 09/22/2021   BILITOT 2.7 (H) 09/22/2021   ALKPHOS 180 (H) 09/22/2021   AST 156 (H) 09/22/2021   ALT 554 (H) 09/22/2021   ANIONGAP 11 09/22/2021    GFR: Estimated Creatinine Clearance: 19 mL/min (A) (by C-G formula based on SCr of 3.08 mg/dL (H)).  No results found for this or any previous visit (from the past 240 hour(s)).    Radiology Studies: US RENAL  Result Date: 09/21/2021 CLINICAL DATA:  Acute kidney injury. EXAM: RENAL / URINARY TRACT ULTRASOUND COMPLETE COMPARISON:  None Available. FINDINGS: Right Kidney: Renal measurements: 11.9 x 4.6 x 4.7 = volume: 132.6 mL. Echogenic renal cortex concerning for medical renal disease. Right upper pole renal cyst measuring 3.2 x 3.3 x 2.9 cm and another smaller adjacent upper pole renal cyst measuring 2.2 x 2.1 x 1.8 cm without evidence of vascular flow, most consistent with benign cysts. Left Kidney: Renal measurements: 10.4 x 5.4 x 4.4 cm = volume: 128.1 mL. Echogenicity within normal limits. No mass or hydronephrosis visualized. Bladder: Urinary bladder is decompressed. Other: None. IMPRESSION: 1.  No evidence of nephrolithiasis or hydronephrosis. 2.  Echogenic renal cortices concerning for medical renal disease. 3.  Simple renal cysts in the upper pole of the right kidney. Electronically Signed   By: Keane Police D.O.   On: 09/21/2021 20:35      LOS: 4 days    Cordelia Poche, MD [facility] 09/22/2021, 11:44 AM   If 7PM-7AM, please contact night-coverage [email or web address]

## 2021-09-22 NOTE — NC FL2 (Addendum)
[place] LEVEL OF CARE SCREENING TOOL     IDENTIFICATION  Patient Name: Ricky Barnett Birthdate: November 23, 1940 Sex: male Admission Date (Current Location): 09/17/2021  [facility] and [place] Number:  Herbalist and Address:  [facility], [place] [address] [place],  [address]      Provider Number: [id number]  Attending Physician Name and Address:  Mariel Aloe, MD  Relative Name and Phone Number:  Ignacio Lowder [phone]    Current Level of Care: Hospital Recommended Level of Care: [place] Prior Approval Number:    Date Approved/Denied:   PASRR Number: [id number]  Discharge Plan: SNF    Current Diagnoses: Patient Active Problem List   Diagnosis Date Noted   AKI (acute kidney injury) ([place]) 09/18/2021   Failure to thrive in adult 09/18/2021   Elevated LFTs 09/18/2021   DNR (do not resuscitate) 09/18/2021   Cognitive dysfunction 02/03/2018   Acute on chronic combined systolic (congestive) and diastolic (congestive) heart failure ([place]) 12/08/2017   Chronic combined systolic and diastolic heart failure ([place]) 12/04/2017   Paroxysmal atrial fibrillation ([place]) 10/31/2017   Acute on chronic combined systolic and diastolic CHF (congestive heart failure) ([place]) 04/23/2017   Acute cholecystitis 10/09/2016   Colitis 06/12/2016   Nonischemic cardiomyopathy ([place]) 10/14/2014   Erectile dysfunction 01/09/2013   Pacemaker 11/30/2012   NSVT (nonsustained ventricular tachycardia) ([place]) 11/13/2012   AV block, Mobitz 1, with HR to 39 10/25/2012   AV junctional tachycardia ([place]) 10/25/2012   Essential hypertension 10/18/2012   Dyslipidemia 10/18/2012   Chest pain 10/18/2012   Non-insulin dependent type 2 diabetes mellitus ([place]) 10/18/2012   First degree AV block 10/18/2012   Palpitations 10/18/2012    Orientation RESPIRATION BLADDER Height & Weight     Self  Normal External catheter Weight: 160 lb 0.9 oz (72.6  kg) Height:  5\' 11"  (180.3 cm)  BEHAVIORAL SYMPTOMS/MOOD NEUROLOGICAL BOWEL NUTRITION STATUS      Continent Diet (see discharge summary)  AMBULATORY STATUS COMMUNICATION OF NEEDS Skin   Limited Assist Verbally Normal                       Personal Care Assistance Level of Assistance  Bathing, Dressing Bathing Assistance: Limited assistance   Dressing Assistance: Limited assistance     Functional Limitations Info             SPECIAL CARE FACTORS FREQUENCY  PT (By licensed PT), OT (By licensed OT)     PT Frequency: per facility OT Frequency: per facility            Contractures      Additional Factors Info  Code Status Code Status Info: DNR             Current Medications (09/22/2021):  This is the current hospital active medication list Current Facility-Administered Medications  Medication Dose Route Frequency Provider Last Rate Last Admin   (feeding supplement) PROSource Plus liquid 30 mL  30 mL Oral TID BM Mariel Aloe, MD   30 mL at 09/22/21 1306   apixaban (ELIQUIS) tablet 2.5 mg  2.5 mg Oral BID Karmen Bongo, MD   2.5 mg at 09/22/21 1000   bisacodyl (DULCOLAX) EC tablet 5 mg  5 mg Oral Daily PRN Karmen Bongo, MD       docusate sodium (COLACE) capsule 100 mg  100 mg Oral BID Karmen Bongo, MD   100 mg at 09/22/21 [phone]  feeding supplement (ENSURE ENLIVE / ENSURE PLUS) liquid 237 mL  237 mL Oral TID BM Mariel Aloe, MD   237 mL at 09/22/21 1306   haloperidol lactate (HALDOL) injection 5 mg  5 mg Intravenous Q6H PRN Karmen Bongo, MD       hydrALAZINE (APRESOLINE) injection 5 mg  5 mg Intravenous Q4H PRN Karmen Bongo, MD       insulin aspart (novoLOG) injection 0-9 Units  0-9 Units Subcutaneous TID WC Karmen Bongo, MD   1 Units at 09/22/21 1237   memantine (NAMENDA) tablet 5 mg  5 mg Oral BID Karmen Bongo, MD   5 mg at 09/22/21 1000   metoprolol succinate (TOPROL-XL) 24 hr tablet 50 mg  50 mg Oral Daily Mariel Aloe, MD   50 mg  at 09/22/21 1000   multivitamin with minerals tablet 1 tablet  1 tablet Oral Daily Mariel Aloe, MD   1 tablet at 09/22/21 1000   ondansetron (ZOFRAN) tablet 4 mg  4 mg Oral Q6H PRN Karmen Bongo, MD       Or   ondansetron [facility]) injection 4 mg  4 mg Intravenous Q6H PRN Karmen Bongo, MD   4 mg at 09/21/21 1426   oxyCODONE (Oxy IR/ROXICODONE) immediate release tablet 5 mg  5 mg Oral Q4H PRN Karmen Bongo, MD   5 mg at 09/20/21 1145   polyethylene glycol (MIRALAX / GLYCOLAX) packet 17 g  17 g Oral Daily PRN Karmen Bongo, MD       tamsulosin [facility]) capsule 0.4 mg  0.4 mg Oral QPC supper Karmen Bongo, MD   0.4 mg at 09/21/21 1731     Discharge Medications: Please see discharge summary for a list of discharge medications.  Relevant Imaging Results:  Relevant Lab Results:   Additional Information SS#: [id number].  [place], LCSWA

## 2021-09-22 NOTE — TOC Initial Note (Signed)
Transition of Care [facility]) - Initial/Assessment Note    Patient Details  Name: Ricky Barnett MRN: [id number] Date of Birth: Jun 04, 1940  [facility]) CM/SW Contact:    Ina Homes, [place] Phone Number: 09/22/2021, 2:10 PM  Clinical Narrative:                  SW spoke with pt's wife Ricky Barnett [phone]) to discuss PT recs for SNF. Barbara agreeable. Ricky Barnett reports no hx with SNF, agreeable to send out in [place] area. SW explained SNF process (SNF search, insurance auth and average LOS).   SW will leave SNF list at bedside for pt's wife.    Expected Discharge Plan: Skilled Nursing Facility Barriers to Discharge: Continued Medical Work up, SNF Pending bed offer, Insurance Authorization   Patient Goals and CMS Choice Patient states their goals for this hospitalization and ongoing recovery are:: return home with wife CMS Medicare.gov Compare Post Acute Care list provided to:: Patient Represenative (must comment) (wife) Choice offered to / list presented to : Spouse  Expected Discharge Plan and Services Expected Discharge Plan: [place] Choice: [place] Living arrangements for the past 2 months: Single Family Home                                      Prior Living Arrangements/Services Living arrangements for the past 2 months: Single Family Home Lives with:: Spouse Patient language and need for interpreter reviewed:: Yes Do you feel safe going back to the place where you live?: Yes      Need for Family Participation in Patient Care: Yes (Comment)   Current home services: DME, Other (comment) (r/w, s/c, w/c, SPC)    Activities of Daily Living      Permission Sought/Granted Permission sought to share information with : Facility Sport and exercise psychologist, Family Supports Permission granted to share information with : Yes, Verbal Permission Granted  Share Information with NAME: Ricky Barnett  Permission  granted to share info w AGENCY: SNF  Permission granted to share info w Relationship: wife  Permission granted to share info w Contact Information: [phone]  Emotional Assessment Appearance:: Other (Comment Required (unable to assess) Attitude/Demeanor/Rapport: Unable to Assess Affect (typically observed): Unable to Assess Orientation: : Oriented to Self      Admission diagnosis:  Elevated LFTs [R79.89] Failure to thrive in adult [R62.7] AKI (acute kidney injury) ([place]) [N17.9] Patient Active Problem List   Diagnosis Date Noted   AKI (acute kidney injury) ([place]) 09/18/2021   Failure to thrive in adult 09/18/2021   Elevated LFTs 09/18/2021   DNR (do not resuscitate) 09/18/2021   Cognitive dysfunction 02/03/2018   Acute on chronic combined systolic (congestive) and diastolic (congestive) heart failure ([place]) 12/08/2017   Chronic combined systolic and diastolic heart failure ([place]) 12/04/2017   Paroxysmal atrial fibrillation ([place]) 10/31/2017   Acute on chronic combined systolic and diastolic CHF (congestive heart failure) ([place]) 04/23/2017   Acute cholecystitis 10/09/2016   Colitis 06/12/2016   Nonischemic cardiomyopathy ([place]) 10/14/2014   Erectile dysfunction 01/09/2013   Pacemaker 11/30/2012   NSVT (nonsustained ventricular tachycardia) ([place]) 11/13/2012   AV block, Mobitz 1, with HR to 39 10/25/2012   AV junctional tachycardia ([place]) 10/25/2012   Essential hypertension 10/18/2012   Dyslipidemia 10/18/2012   Chest pain 10/18/2012   Non-insulin dependent type 2 diabetes mellitus ([place]) 10/18/2012   First  degree AV block 10/18/2012   Palpitations 10/18/2012   PCP:  Merrilee Seashore, MD Pharmacy:   [id number] [address], [place] [address] Phone: [phone] Fax: [place], [place] - [address] [address] [facility] [place] [address] Phone:  [phone] Fax: [phone]     Social Determinants of Health (SDOH) Interventions    Readmission Risk Interventions     No data to display

## 2021-09-23 DIAGNOSIS — N179 Acute kidney failure, unspecified: Secondary | ICD-10-CM | POA: Diagnosis not present

## 2021-09-23 DIAGNOSIS — R7989 Other specified abnormal findings of blood chemistry: Secondary | ICD-10-CM | POA: Diagnosis not present

## 2021-09-23 DIAGNOSIS — I5042 Chronic combined systolic (congestive) and diastolic (congestive) heart failure: Secondary | ICD-10-CM | POA: Diagnosis not present

## 2021-09-23 DIAGNOSIS — E785 Hyperlipidemia, unspecified: Secondary | ICD-10-CM | POA: Diagnosis not present

## 2021-09-23 LAB — COMPREHENSIVE METABOLIC PANEL WITH GFR
ALT: 389 U/L — ABNORMAL HIGH (ref 0–44)
AST: 97 U/L — ABNORMAL HIGH (ref 15–41)
Albumin: 2.6 g/dL — ABNORMAL LOW (ref 3.5–5.0)
Alkaline Phosphatase: 153 U/L — ABNORMAL HIGH (ref 38–126)
Anion gap: 7 (ref 5–15)
BUN: 81 mg/dL — ABNORMAL HIGH (ref 8–23)
CO2: 21 mmol/L — ABNORMAL LOW (ref 22–32)
Calcium: 8.5 mg/dL — ABNORMAL LOW (ref 8.9–10.3)
Chloride: 118 mmol/L — ABNORMAL HIGH (ref 98–111)
Creatinine, Ser: 3.24 mg/dL — ABNORMAL HIGH (ref 0.61–1.24)
GFR, Estimated: 18 mL/min — ABNORMAL LOW
Glucose, Bld: 135 mg/dL — ABNORMAL HIGH (ref 70–99)
Potassium: 6.2 mmol/L — ABNORMAL HIGH (ref 3.5–5.1)
Sodium: 146 mmol/L — ABNORMAL HIGH (ref 135–145)
Total Bilirubin: 2.2 mg/dL — ABNORMAL HIGH (ref 0.3–1.2)
Total Protein: 5.5 g/dL — ABNORMAL LOW (ref 6.5–8.1)

## 2021-09-23 LAB — GLUCOSE, CAPILLARY
Glucose-Capillary: 113 mg/dL — ABNORMAL HIGH (ref 70–99)
Glucose-Capillary: 159 mg/dL — ABNORMAL HIGH (ref 70–99)
Glucose-Capillary: 179 mg/dL — ABNORMAL HIGH (ref 70–99)
Glucose-Capillary: 123 mg/dL — ABNORMAL HIGH (ref 70–99)

## 2021-09-23 MED ORDER — SODIUM BICARBONATE 650 MG PO TABS
650.0000 mg | ORAL_TABLET | Freq: Two times a day (BID) | ORAL | Status: DC
  Administered 2021-09-23 – 2021-09-27 (×9): 650 mg via ORAL
  Filled 2021-09-23 (×9): qty 1

## 2021-09-23 MED ORDER — DARBEPOETIN ALFA 150 MCG/0.3ML IJ SOSY
150.0000 ug | PREFILLED_SYRINGE | INTRAMUSCULAR | Status: DC
  Administered 2021-09-23: 150 ug via SUBCUTANEOUS
  Filled 2021-09-23: qty 0.3

## 2021-09-23 MED ORDER — METOPROLOL SUCCINATE ER 25 MG PO TB24
25.0000 mg | ORAL_TABLET | Freq: Every day | ORAL | Status: DC
  Administered 2021-09-24 – 2021-09-28 (×5): 25 mg via ORAL
  Filled 2021-09-23 (×5): qty 1

## 2021-09-23 MED ORDER — SODIUM CHLORIDE 0.9 % IV SOLN: INTRAVENOUS | Status: AC

## 2021-09-23 MED ORDER — HYDROCERIN EX CREA
TOPICAL_CREAM | Freq: Two times a day (BID) | CUTANEOUS | Status: DC
  Filled 2021-09-23: qty 113

## 2021-09-23 MED ORDER — SODIUM ZIRCONIUM CYCLOSILICATE 10 G PO PACK
10.0000 g | PACK | Freq: Once | ORAL | Status: AC
Start: 2021-09-23 — End: 2021-09-23
  Administered 2021-09-23: 10 g via ORAL
  Filled 2021-09-23: qty 1

## 2021-09-23 NOTE — Progress Notes (Signed)
Physical Therapy Treatment Patient Details Name: Ricky Barnett MRN: [id number] DOB: 1940-06-01 Today's Date: 09/23/2021   History of Present Illness Ricky Barnett is a 81 y.o. male admitted 7/18 presenting with decreased appetite, combativeness.  His wife has noticed his blood sugar has been up, has not been eating or taking his medications like he should.  He has lost a lot of weight.  His dementia is getting worse.  He is getting more physical. PMH:  stage 3b CKD; dementia; DM; HTN; and pacemaker placement    PT Comments    Pt admitted with above diagnosis. Pt did not stand fully upright and has difficulty sequencing steps and RW. Pt needs mod cues and min to mod assist for transfers.  Continues to need SNF. Pt currently with functional limitations due to balance and endurance deficits. Pt will benefit from skilled PT to increase their independence and safety with mobility to allow discharge to the venue listed below.      Recommendations for follow up therapy are one component of a multi-disciplinary discharge planning process, led by the attending physician.  Recommendations may be updated based on patient status, additional functional criteria and insurance authorization.  Follow Up Recommendations  Skilled nursing-short term rehab (<3 hours/day) Can patient physically be transported by private vehicle: Yes   Assistance Recommended at Discharge Frequent or constant Supervision/Assistance  Patient can return home with the following A lot of help with walking and/or transfers;A lot of help with bathing/dressing/bathroom;Direct supervision/assist for medications management;Assistance with feeding;Assistance with cooking/housework;Direct supervision/assist for financial management;Assist for transportation;Help with stairs or ramp for entrance   Equipment Recommendations  None recommended by PT    Recommendations for Other Services       Precautions / Restrictions  Precautions Precautions: Fall Restrictions Weight Bearing Restrictions: No     Mobility  Bed Mobility Overal bed mobility: Needs Assistance Bed Mobility: Supine to Sit, Sit to Supine     Supine to sit: HOB elevated, Min assist     General bed mobility comments: able to sit EOB with min guard only to ensure truncal balance    Transfers Overall transfer level: Needs assistance Equipment used: Rolling walker (2 wheels) Transfers: Sit to/from Stand Sit to Stand: Min assist, Mod assist, From elevated surface           General transfer comment: placed RW in front of pt with pt confirming familiarity. Very light MIn A to stand at bedside and pt was able to pivot to the recliner taking a few steps with use of RW. Needed assist to guide RW and to step back to chair with pt neeidng incr time as he mmoves slowlly.    Ambulation/Gait                   Stairs             Wheelchair Mobility    Modified Rankin (Stroke Patients Only)       Balance Overall balance assessment: Needs assistance Sitting-balance support: No upper extremity supported, Feet supported, Single extremity supported Sitting balance-Leahy Scale: Good Sitting balance - Comments: able to bend to doff socks Postural control: Posterior lean, Right lateral lean, Left lateral lean Standing balance support: Bilateral upper extremity supported Standing balance-Leahy Scale: Poor Standing balance comment: Needs UE and external support                            Cognition Arousal/Alertness: Awake/alert Behavior  During Therapy: WFL for tasks assessed/performed Overall Cognitive Status: Impaired/Different from baseline Area of Impairment: Memory, Orientation, Following commands, Safety/judgement, Awareness, Problem solving, Attention                 Orientation Level: Disoriented to, Place, Time, Situation Current Attention Level: Sustained Memory: Decreased short-term  memory Following Commands: Follows one step commands inconsistently, Follows one step commands with increased time Safety/Judgement: Decreased awareness of safety, Decreased awareness of deficits Awareness: Intellectual Problem Solving: Slow processing, Requires tactile cues, Requires verbal cues General Comments: hx of dementia that has been worsening at home recently. A&Ox1. pt pleasant, attempted all tasks with cues and assist        Exercises General Exercises - Lower Extremity Ankle Circles/Pumps: AROM, Both, 10 reps, Seated Long Arc Quad: AROM, Both, 10 reps, Seated Hip Flexion/Marching: AROM, Both, 10 reps, Seated    General Comments        Pertinent Vitals/Pain Pain Assessment Pain Assessment: Faces Faces Pain Scale: Hurts little more Breathing: normal Negative Vocalization: none Facial Expression: smiling or inexpressive Body Language: relaxed Consolability: no need to console PAINAD Score: 0 Pain Location: generalized Pain Descriptors / Indicators: Grimacing, Guarding Pain Intervention(s): Limited activity within patient's tolerance, Monitored during session, Repositioned    Home Living                          Prior Function            PT Goals (current goals can now be found in the care plan section) Acute Rehab PT Goals Patient Stated Goal: to go to SNF Progress towards PT goals: Progressing toward goals    Frequency    Min 2X/week      PT Plan Current plan remains appropriate    Co-evaluation              AM-PAC PT "6 Clicks" Mobility   Outcome Measure  Help needed turning from your back to your side while in a flat bed without using bedrails?: A Little Help needed moving from lying on your back to sitting on the side of a flat bed without using bedrails?: A Lot Help needed moving to and from a bed to a chair (including a wheelchair)?: A Lot Help needed standing up from a chair using your arms (e.g., wheelchair or bedside  chair)?: A Lot Help needed to walk in hospital room?: Total Help needed climbing 3-5 steps with a railing? : Total 6 Click Score: 11    End of Session Equipment Utilized During Treatment: Gait belt Activity Tolerance: Patient limited by fatigue Patient left: with call bell/phone within reach;with family/visitor present;in chair;with chair alarm set Nurse Communication: Mobility status PT Visit Diagnosis: Muscle weakness (generalized) (M62.81);Unsteadiness on feet (R26.81)     Time: 5625-6389 PT Time Calculation (min) (ACUTE ONLY): 21 min  Charges:  $Therapeutic Activity: 8-22 mins                     [facility] M,PT Acute Rehab Services [phone]    Alvira Philips 09/23/2021, 2:08 PM

## 2021-09-23 NOTE — Plan of Care (Signed)
  Problem: Education: Goal: Knowledge of General Education information will improve Description: Including pain rating scale, medication(s)/side effects and non-pharmacologic comfort measures Outcome: Not Progressing   Problem: Health Behavior/Discharge Planning: Goal: Ability to manage health-related needs will improve Outcome: Not Progressing   Problem: Clinical Measurements: Goal: Will remain free from infection Outcome: Progressing Goal: Cardiovascular complication will be avoided Outcome: Progressing

## 2021-09-23 NOTE — Progress Notes (Signed)
Speech Language Pathology Treatment: Dysphagia  Patient Details Name: Ricky Barnett MRN: [id number] DOB: [birth year] Today's Date: 09/23/2021 Time: 1740-8144 SLP Time Calculation (min) (ACUTE ONLY): 11 min  Assessment / Plan / Recommendation Clinical Impression  Wife present and stated he did not want to eat much of his breakfast. He was consuming applesauce and thin juice when therapist arrived. Cleared throat once during observation of thin liquids with minimal holding noted. Wife reports he began to hold food and expectorate at home. Evaluating therapist educated cognitive effects on swallow function and this SLP reiterated this and educated preparation of puree food which may be needed in the near future. Continue Dys 2 for now, thin liquids, ensure oral cavity is clear at end of meals. Goals met for acute care- no further ST needed at this time. Recommend continued therapy at SNF.     HPI HPI: Ricky Barnett is a 81 y.o. male admitted 7/18 presenting with decreased appetite, combativeness.  His wife has noticed his blood sugar has been up, has not been eating or taking his medications like he should.  He has lost a lot of weight.  His dementia is getting worse.  He is getting more physical. PMH:  stage 3b CKD; dementia; DM; HTN; and pacemaker placement      SLP Plan  All goals met;Discharge SLP treatment due to (comment)      Recommendations for follow up therapy are one component of a multi-disciplinary discharge planning process, led by the attending physician.  Recommendations may be updated based on patient status, additional functional criteria and insurance authorization.    Recommendations  Diet recommendations: Dysphagia 2 (fine chop);Thin liquid Liquids provided via: Cup;Straw Medication Administration: Crushed with puree Supervision: Staff to assist with self feeding;Full supervision/cueing for compensatory strategies Compensations: Minimize environmental distractions;Slow  rate;Small sips/bites;Follow solids with liquid;Other (Comment) Postural Changes and/or Swallow Maneuvers: Seated upright 90 degrees                Oral Care Recommendations: Oral care BID Follow Up Recommendations: Skilled nursing-short term rehab (<3 hours/day) Assistance recommended at discharge: Frequent or constant Supervision/Assistance SLP Visit Diagnosis: Dysphagia, unspecified (R13.10) Plan: All goals met;Discharge SLP treatment due to (comment)           Houston Siren  09/23/2021, 11:05 AM

## 2021-09-23 NOTE — Progress Notes (Signed)
PROGRESS NOTE    Ricky Barnett  WRU:[id number] DOB: [birth year] DOA: 09/17/2021 PCP: Merrilee Seashore, MD   Brief Narrative: Ricky Barnett is a 81 y.o. male with a history of CKD stage IIIb, dementia, diabetes mellitus, hypertension and atrial fibrillation s/p pacemaker placement. Patient presented secondary to decreased appetite and combativeness in setting of dementia. Patient noted to have evidence of AKI and possible shock liver. Patient has been managed on IV fluids now with worsening AKI.   Assessment and Plan:  AKI on CKD stage IIIb Baseline creatinine of about 2. Creatine of 3.12 on admission. Complicated by hypotension and Entresto usage. Entresto held. IV fluids initiated. BNP >4,500. Patient does not appear to be overloaded on exam. He is +4.8 L although I/O are not completely accurate. Weight up to 165 lbs today. Lasix IV given x1 with initial improvement of creatinine and subsequent worsening -Hold IV fluids/Lasix -Strict in/out and daily weights -Daily C/BMP -Nephrology consult today  Elevated LFTs AST/ALT of 1079/998 with associated total bilirubin of 2.4 an alkaline phosphatase of 249 on admission. Possibly secondary to shock liver from hypotension. Improving AST/ALT with increased total bilirubin. CT abdomen/pelvis without hepatobiliary etiology noted. Improving. -Daily CMP  Hyperkalemia Likely from worsening AKI. Initially improved and now worsened again. -Lokelma 10 g x1  Hypernatremia Would presume this is secondary to dehydration. Difficult fluid status assessment. -Nephrology recommendations  Failure to thrive Thought to be secondary to dementia. Palliative care consulted. Dietitian consulted.   Dysphagia Speech therapy consulted and recommend a dysphagia 2 diet.  Dementia Noted. Patient has 24/7 caregiver assistance. PT/OT recommending SNF. -Continue Namenda  Diabetes mellitus, type 2 Patient is on metformin as an outpatient -Continue  SSI  Primary hypertension -Continue Toprol XL and hold Entresto  Hyperlipidemia Pravastatin held secondary to elevated AST/ALT  Atrial fibrillation -Continue Eliquis. Patient is s/p pacemaker placement. He is on Toprol XL as an outpatient for rate control which was held on admission because of bradycardia. -Continue Toprol XL  Chronic combined systolic and diastolic heart failure Most recent Transthoracic Echocardiogram significant for EF of 20% with grade 2 diastolic dysfunction from 2019. Patient is on Entresto and Toprol XL as an outpatient, which were held on admission. -Strict in/out -Daily weights  DVT prophylaxis: Eliquis Code Status:   Code Status: DNR Family Communication: None at bedside Disposition Plan: Discharge to SNF pending improvement of AKI   Consultants:  Palliative care medicine Nephrology  Procedures:  None  Antimicrobials: None    Subjective: Patient reports no issues. Ate some of his breakfast. Asking if I ate this morning. Calls me "good boy." Patient without any concerns. Asks me if I plan to watch a movie today.  Objective: BP 116/77 (BP Location: Right Arm)   Pulse 78   Temp (!) 97.5 F (36.4 C) (Oral)   Resp 18   Ht 5\' 11"  (1.803 m)   Wt 75.2 kg   SpO2 98%   BMI 23.12 kg/m   Examination:  General exam: Appears calm and comfortable Respiratory system: Clear to auscultation. Respiratory effort normal. Cardiovascular system: S1 & S2 heard, RRR. No murmurs, rubs, gallops or clicks. Gastrointestinal system: Abdomen is nondistended, soft and nontender. Normal bowel sounds heard. Central nervous system: Alert and oriented to self.  Musculoskeletal: No edema. No calf tenderness Skin: No cyanosis. No rashes. Dry.   Data Reviewed: I have personally reviewed following labs and imaging studies  CBC Lab Results  Component Value Date   WBC 10.3 09/19/2021  RBC 3.43 (L) 09/19/2021   HGB 8.9 (L) 09/19/2021   HCT 27.6 (L) 09/19/2021    MCV 80.5 09/19/2021   MCH 25.9 (L) 09/19/2021   PLT 283 09/19/2021   MCHC 32.2 09/19/2021   RDW 16.3 (H) 09/19/2021   LYMPHSABS 0.9 09/18/2021   MONOABS 0.9 09/18/2021   EOSABS 0.0 09/18/2021   BASOSABS 0.0 44/96/7591     Last metabolic panel Lab Results  Component Value Date   NA 146 (H) 09/23/2021   K 6.2 (H) 09/23/2021   CL 118 (H) 09/23/2021   CO2 21 (L) 09/23/2021   BUN 81 (H) 09/23/2021   CREATININE 3.24 (H) 09/23/2021   GLUCOSE 135 (H) 09/23/2021   GFRNONAA 18 (L) 09/23/2021   GFRAA 35 (L) 01/01/2018   CALCIUM 8.5 (L) 09/23/2021   PROT 5.5 (L) 09/23/2021   ALBUMIN 2.6 (L) 09/23/2021   BILITOT 2.2 (H) 09/23/2021   ALKPHOS 153 (H) 09/23/2021   AST 97 (H) 09/23/2021   ALT 389 (H) 09/23/2021   ANIONGAP 7 09/23/2021    GFR: Estimated Creatinine Clearance: 18.7 mL/min (A) (by C-G formula based on SCr of 3.24 mg/dL (H)).  No results found for this or any previous visit (from the past 240 hour(s)).    Radiology Studies: US RENAL  Result Date: 09/21/2021 CLINICAL DATA:  Acute kidney injury. EXAM: RENAL / URINARY TRACT ULTRASOUND COMPLETE COMPARISON:  None Available. FINDINGS: Right Kidney: Renal measurements: 11.9 x 4.6 x 4.7 = volume: 132.6 mL. Echogenic renal cortex concerning for medical renal disease. Right upper pole renal cyst measuring 3.2 x 3.3 x 2.9 cm and another smaller adjacent upper pole renal cyst measuring 2.2 x 2.1 x 1.8 cm without evidence of vascular flow, most consistent with benign cysts. Left Kidney: Renal measurements: 10.4 x 5.4 x 4.4 cm = volume: 128.1 mL. Echogenicity within normal limits. No mass or hydronephrosis visualized. Bladder: Urinary bladder is decompressed. Other: None. IMPRESSION: 1.  No evidence of nephrolithiasis or hydronephrosis. 2.  Echogenic renal cortices concerning for medical renal disease. 3.  Simple renal cysts in the upper pole of the right kidney. Electronically Signed   By: Keane Police D.O.   On: 09/21/2021 20:35       LOS: 5 days    Cordelia Poche, MD [facility] 09/23/2021, 8:32 AM   If 7PM-7AM, please contact night-coverage [email or web address]

## 2021-09-23 NOTE — Consult Note (Signed)
[facility] Renal Consultation Note  Requesting MD: Netty Indication for Consultation: A on CRF  HPI:  Ricky Barnett is a 81 y.o. male with DM, HTN, s/p PPM, dementia and stage 3b CKD with crt in the low 2's-  last measured in Feb of 2023 at 2.18.  He was brought to the hospital on 7/19 for increased combativeness and not eating or taking meds -  they request SNF.  Initial medical evaluation found crt of 3.12, elevated AST and ALT.  Over the first few days of hospitalization-  was hydrated and numbers trended better-  crt of 2.79 on 7/22-  since then crt up to 3, down to 2.6 but then up to 3.2 today with high K so I was consulted -  he had received more IVF, then lasix.  BP has been soft, UOP not well recorded but does not appear oliguric. U/A done on 7/19-  6-10 W and RBC, >300 proteinuria (no old U/As).  Renal u/s done on 7/22-  right kidney with echogen cortex but left did not show such change-  no hydro.  He is in NAD-  has mittens on-  said his lunch was good but did not touch it-  his external urine collection system is disconnected   Creat  Date/Time Value Ref Range Status  11/22/2012 11:20 AM 0.96 0.50 - 1.35 mg/dL Final   Creatinine, Ser  Date/Time Value Ref Range Status  09/23/2021 01:15 AM 3.24 (H) 0.61 - 1.24 mg/dL Final  09/22/2021 04:54 PM 2.61 (H) 0.61 - 1.24 mg/dL Final  09/22/2021 03:04 AM 3.08 (H) 0.61 - 1.24 mg/dL Final  09/21/2021 04:25 PM 2.79 (H) 0.61 - 1.24 mg/dL Final  09/21/2021 01:55 AM 2.88 (H) 0.61 - 1.24 mg/dL Final  09/20/2021 04:28 AM 2.74 (H) 0.61 - 1.24 mg/dL Final  09/19/2021 06:52 AM 2.89 (H) 0.61 - 1.24 mg/dL Final  09/18/2021 12:31 AM 3.12 (H) 0.61 - 1.24 mg/dL Final  04/04/2021 03:58 PM 2.18 (H) 0.76 - 1.27 mg/dL Final  01/01/2018 04:06 PM 2.06 (H) 0.76 - 1.27 mg/dL Final  12/24/2017 11:26 AM 1.63 (H) 0.76 - 1.27 mg/dL Final  12/10/2017 05:24 AM 1.55 (H) 0.61 - 1.24 mg/dL Final  12/09/2017 05:03 AM 1.81 (H) 0.61 - 1.24 mg/dL Final   12/08/2017 02:14 PM 1.81 (H) 0.61 - 1.24 mg/dL Final  12/04/2017 09:49 AM 1.59 (H) 0.61 - 1.24 mg/dL Final  07/23/2017 10:07 AM 1.22 0.76 - 1.27 mg/dL Final  04/23/2017 03:10 PM 1.18 0.76 - 1.27 mg/dL Final  05/15/2011 11:30 AM 0.95 0.50 - 1.35 mg/dL Final     PMHx:   Past Medical History:  Diagnosis Date   Angina    just started recently ,2-3 days ago-went to see Dr. Einar Gip 05/14/2011   Arthritis    bil knees   Chronic kidney disease    Dementia ([place])    Diabetes mellitus    Type II   Dysrhythmia    2-3 yrs ago   Headache(784.0)    occas   Hypertension    Left ventricular dysfunction    Mastoiditis    right ear   Pacemaker 11/29/2012   dual chamber   Palpitations     Past Surgical History:  Procedure Laterality Date   BIV UPGRADE N/A 12/04/2017   Procedure: Dual Chamber PPM upgrading to BIV PPM;  Surgeon: Evans Lance, MD;  Location: [facility];  Service: Cardiovascular;  Laterality: N/A;   CYSTECTOMY     under chin  [place]     states he cut finger off when cutting wood   INSERT / REPLACE / REMOVE PACEMAKER  11/29/2012   [employer]    NASAL SINUS SURGERY     left side   PACEMAKER LEAD REMOVAL N/A 01/15/2018   Procedure: PACEMAKER LEAD REMOVAL,REMOVE ONE LEAD,OCCLUDED ACCESS,PUT IN BIVENTRICULAR PACEMAKER;  Surgeon: Evans Lance, MD;  Location: [facility];  Service: Cardiovascular;  Laterality: N/A;   PERMANENT PACEMAKER INSERTION N/A 11/29/2012   Procedure: PERMANENT PACEMAKER INSERTION;  Surgeon: Sanda Klein, MD;  Location: [facility];  Service: Cardiovascular;  Laterality: N/A;   TYMPANOPLASTY  05/19/2011   Procedure: TYMPANOPLASTY;  Surgeon: Thornell Sartorius, MD;  Location: [facility] OR;  Service: ENT;  Laterality: Right;  Exploratory Tympanotomy    Family Hx:  Family History  Problem Relation Age of Onset   Heart attack Father     Social History:  reports that he has never smoked. He has never used smokeless tobacco. He reports  that he does not drink alcohol and does not use drugs.  Allergies: No Known Allergies  Medications: Prior to Admission medications   Medication Sig Start Date End Date Taking? Authorizing Provider  acetaminophen (TYLENOL) 500 MG tablet Take 500 mg by mouth as needed (pain).   Yes [provider]  apixaban (ELIQUIS) 2.5 MG TABS tablet Take 1 tablet (2.5 mg total) by mouth 2 (two) times daily. 06/26/21  Yes Evans Lance, MD  ENTRESTO 97-103 MG TAKE 1 TABLET BY MOUTH TWICE A DAY Patient taking differently: Take 1 tablet by mouth 2 (two) times daily. 05/16/21  Yes Evans Lance, MD  memantine (NAMENDA) 5 MG tablet Take 5 mg by mouth 2 (two) times daily. 07/31/19  Yes [provider]  metFORMIN (GLUCOPHAGE-XR) 500 MG 24 hr tablet Take 1,000 mg by mouth 2 (two) times daily. 09/08/19  Yes [provider]  metoprolol succinate (TOPROL-XL) 50 MG 24 hr tablet Take 50 mg by mouth daily. Take with or immediately following a meal.    Yes [provider]  pravastatin (PRAVACHOL) 40 MG tablet TAKE 1 TABLET BY MOUTH EVERY DAY Patient taking differently: Take 40 mg by mouth daily. 09/13/18  Yes Croitoru, Mihai, MD  Tamsulosin HCl (FLOMAX) 0.4 MG CAPS Take 0.4 mg by mouth daily after supper.   Yes [provider]  traMADol (ULTRAM) 50 MG tablet Take 50 mg by mouth as needed for moderate pain or severe pain.   Yes [provider]  Multiple Vitamin (MULTIVITAMIN) tablet Take 1 tablet by mouth daily. Patient not taking: Reported on 09/18/2021    [provider]    I have reviewed the patient's current medications.  Labs:  Results for orders placed or performed during the hospital encounter of 09/17/21 (from the past 48 hour(s))  Glucose, capillary     Status: Abnormal   Collection Time: 09/21/21  4:04 PM  Result Value Ref Range   Glucose-Capillary 123 (H) 70 - 99 mg/dL    Comment: Glucose reference range applies only to samples taken after fasting  for at least 8 hours.  Brain natriuretic peptide     Status: Abnormal   Collection Time: 09/21/21  4:25 PM  Result Value Ref Range   B Natriuretic Peptide >4,500.0 (H) 0.0 - 100.0 pg/mL    Comment: Performed at [facility] [address] [place], [place] [address]  Basic metabolic panel     Status: Abnormal  Collection Time: 09/21/21  4:25 PM  Result Value Ref Range   Sodium 145 135 - 145 mmol/L    Comment: DELTA CHECK NOTED   Potassium 4.9 3.5 - 5.1 mmol/L   Chloride 112 (H) 98 - 111 mmol/L   CO2 20 (L) 22 - 32 mmol/L   Glucose, Bld 125 (H) 70 - 99 mg/dL    Comment: Glucose reference range applies only to samples taken after fasting for at least 8 hours.   BUN 77 (H) 8 - 23 mg/dL   Creatinine, Ser 2.79 (H) 0.61 - 1.24 mg/dL   Calcium 8.3 (L) 8.9 - 10.3 mg/dL   GFR, Estimated 22 (L) >60 mL/min    Comment: (NOTE) Calculated using the CKD-EPI Creatinine Equation (2021)    Anion gap 13 5 - 15    Comment: Performed at [facility] [address] [place], [place] [address]  Glucose, capillary     Status: Abnormal   Collection Time: 09/21/21  9:46 PM  Result Value Ref Range   Glucose-Capillary 152 (H) 70 - 99 mg/dL    Comment: Glucose reference range applies only to samples taken after fasting for at least 8 hours.  Comprehensive metabolic panel     Status: Abnormal   Collection Time: 09/22/21  3:04 AM  Result Value Ref Range   Sodium 145 135 - 145 mmol/L   Potassium 6.1 (H) 3.5 - 5.1 mmol/L    Comment: HEMOLYSIS AT THIS LEVEL MAY AFFECT RESULT DELTA CHECK NOTED HEMOLYSIS AT THIS LEVEL MAY AFFECT RESULT    Chloride 115 (H) 98 - 111 mmol/L   CO2 19 (L) 22 - 32 mmol/L   Glucose, Bld 132 (H) 70 - 99 mg/dL    Comment: Glucose reference range applies only to samples taken after fasting for at least 8 hours.   BUN 85 (H) 8 - 23 mg/dL   Creatinine, Ser 3.08 (H) 0.61 - 1.24 mg/dL   Calcium 8.4 (L) 8.9 - 10.3 mg/dL   Total Protein 5.9 (L) 6.5 - 8.1 g/dL   Albumin 2.9  (L) 3.5 - 5.0 g/dL   AST 156 (H) 15 - 41 U/L    Comment: HEMOLYSIS AT THIS LEVEL MAY AFFECT RESULT HEMOLYSIS AT THIS LEVEL MAY AFFECT RESULT    ALT 554 (H) 0 - 44 U/L    Comment: HEMOLYSIS AT THIS LEVEL MAY AFFECT RESULT HEMOLYSIS AT THIS LEVEL MAY AFFECT RESULT    Alkaline Phosphatase 180 (H) 38 - 126 U/L   Total Bilirubin 2.7 (H) 0.3 - 1.2 mg/dL    Comment: HEMOLYSIS AT THIS LEVEL MAY AFFECT RESULT HEMOLYSIS AT THIS LEVEL MAY AFFECT RESULT    GFR, Estimated 19 (L) >60 mL/min    Comment: (NOTE) Calculated using the CKD-EPI Creatinine Equation (2021)    Anion gap 11 5 - 15    Comment: Performed at [facility], [place] [address], [place], [place] [address]  Glucose, capillary     Status: Abnormal   Collection Time: 09/22/21  7:46 AM  Result Value Ref Range   Glucose-Capillary 119 (H) 70 - 99 mg/dL    Comment: Glucose reference range applies only to samples taken after fasting for at least 8 hours.  Glucose, capillary     Status: Abnormal   Collection Time: 09/22/21 11:49 AM  Result Value Ref Range   Glucose-Capillary 126 (H) 70 - 99 mg/dL    Comment: Glucose reference range applies only to samples taken after fasting for at least  8 hours.  Basic metabolic panel     Status: Abnormal   Collection Time: 09/22/21  4:54 PM  Result Value Ref Range   Sodium 145 135 - 145 mmol/L   Potassium 4.5 3.5 - 5.1 mmol/L    Comment: DELTA CHECK NOTED   Chloride 113 (H) 98 - 111 mmol/L   CO2 24 22 - 32 mmol/L   Glucose, Bld 143 (H) 70 - 99 mg/dL    Comment: Glucose reference range applies only to samples taken after fasting for at least 8 hours.   BUN 77 (H) 8 - 23 mg/dL   Creatinine, Ser 2.61 (H) 0.61 - 1.24 mg/dL   Calcium 8.5 (L) 8.9 - 10.3 mg/dL   GFR, Estimated 24 (L) >60 mL/min    Comment: (NOTE) Calculated using the CKD-EPI Creatinine Equation (2021)    Anion gap 8 5 - 15    Comment: Performed at [facility] [address] [place], [place] [address]  Glucose,  capillary     Status: Abnormal   Collection Time: 09/22/21  5:44 PM  Result Value Ref Range   Glucose-Capillary 132 (H) 70 - 99 mg/dL    Comment: Glucose reference range applies only to samples taken after fasting for at least 8 hours.  Glucose, capillary     Status: Abnormal   Collection Time: 09/22/21  9:07 PM  Result Value Ref Range   Glucose-Capillary 137 (H) 70 - 99 mg/dL    Comment: Glucose reference range applies only to samples taken after fasting for at least 8 hours.  Comprehensive metabolic panel     Status: Abnormal   Collection Time: 09/23/21  1:15 AM  Result Value Ref Range   Sodium 146 (H) 135 - 145 mmol/L   Potassium 6.2 (H) 3.5 - 5.1 mmol/L    Comment: DELTA CHECK NOTED   Chloride 118 (H) 98 - 111 mmol/L   CO2 21 (L) 22 - 32 mmol/L   Glucose, Bld 135 (H) 70 - 99 mg/dL    Comment: Glucose reference range applies only to samples taken after fasting for at least 8 hours.   BUN 81 (H) 8 - 23 mg/dL   Creatinine, Ser 3.24 (H) 0.61 - 1.24 mg/dL   Calcium 8.5 (L) 8.9 - 10.3 mg/dL   Total Protein 5.5 (L) 6.5 - 8.1 g/dL   Albumin 2.6 (L) 3.5 - 5.0 g/dL   AST 97 (H) 15 - 41 U/L   ALT 389 (H) 0 - 44 U/L   Alkaline Phosphatase 153 (H) 38 - 126 U/L   Total Bilirubin 2.2 (H) 0.3 - 1.2 mg/dL   GFR, Estimated 18 (L) >60 mL/min    Comment: (NOTE) Calculated using the CKD-EPI Creatinine Equation (2021)    Anion gap 7 5 - 15    Comment: Performed at [facility], [place] [address], [place], [place] [address]  Glucose, capillary     Status: Abnormal   Collection Time: 09/23/21  7:43 AM  Result Value Ref Range   Glucose-Capillary 113 (H) 70 - 99 mg/dL    Comment: Glucose reference range applies only to samples taken after fasting for at least 8 hours.  Glucose, capillary     Status: Abnormal   Collection Time: 09/23/21 11:49 AM  Result Value Ref Range   Glucose-Capillary 159 (H) 70 - 99 mg/dL    Comment: Glucose reference range applies only to samples taken after fasting for  at least 8 hours.     ROS:  Review of systems not obtained due to patient factors.  Physical Exam: Vitals:   09/23/21 0633 09/23/21 0745  BP:  116/77  Pulse: 77 78  Resp: 18 18  Temp: (!) 97.4 F (36.3 C) (!) 97.5 F (36.4 C)  SpO2: 100% 98%     General:  thin, confused BM-   mittens on  HEENT: PERRLA, EOMI,  mucous membranes slightly dry  Neck: no JVD Heart: RRR Lungs: mostly clear Abdomen: soft, non tender Extremities: no edema  Skin:warm and dry Neuro:confused but does answer questions directly   Assessment/Plan: 81 year old BM with dementia and baseline CKD-  now presents with mainly behavioral issues/FTT-  but also A on CRF  1.Renal- baseline CKD with best crt in 2023 of 2.18, proteinuria and at least one kidney with echogenic cortex-  would be expected to see progression in this setting.  Now with some A on CRF as well as BUN up.  No hydro.  BP is soft and I think if anything is dry.  I will decrease his metoprolol and hydrate him gently the next 24 hours.  He is not a candidate for dialysis due to dementia but fortunately there are not indications at this time  2. Hypertension/volume  - BP soft and if anything dry-  dec metoprolol and gently hydrate-   3. Hyperkalemia-  has been intermittent-  gave lokelma today -  acidosis not helping-  will give bicarb as well 4. Metabolic acidosis due to #1-  start sodium bicarb 5. Anemia  - severe as well-  likely due to CKD-  will check iron stores and add ESA    Louis Meckel 09/23/2021, 1:29 PM

## 2021-09-23 NOTE — TOC Progression Note (Signed)
Transition of Care [facility]) - Progression Note    Patient Details  Name: Ricky Barnett MRN: [id number] Date of Birth: 1940/12/16  Transition [facility]) CM/SW [place], [place] Phone Number: 09/23/2021, 5:18 PM  Clinical Narrative:    CSW spoke with pt spouse about [facility] being the only bed offer. Pt spouse was agreeable to [facility] bc it is close to their home. CSW started Josem Kaufmann PJR#[phone]   Expected Discharge Plan: [place] Barriers to Discharge: Continued Medical Work up, SNF Pending bed offer, Ship broker  Expected Discharge Plan and Services Expected Discharge Plan: [place] Choice: [place] arrangements for the past 2 months: Single Family Home                                       Social Determinants of Health (SDOH) Interventions    Readmission Risk Interventions     No data to display

## 2021-09-24 DIAGNOSIS — N179 Acute kidney failure, unspecified: Secondary | ICD-10-CM | POA: Diagnosis not present

## 2021-09-24 DIAGNOSIS — R7989 Other specified abnormal findings of blood chemistry: Secondary | ICD-10-CM | POA: Diagnosis not present

## 2021-09-24 DIAGNOSIS — I5042 Chronic combined systolic (congestive) and diastolic (congestive) heart failure: Secondary | ICD-10-CM | POA: Diagnosis not present

## 2021-09-24 DIAGNOSIS — E785 Hyperlipidemia, unspecified: Secondary | ICD-10-CM | POA: Diagnosis not present

## 2021-09-24 LAB — COMPREHENSIVE METABOLIC PANEL
ALT: 311 U/L — ABNORMAL HIGH (ref 0–44)
AST: 60 U/L — ABNORMAL HIGH (ref 15–41)
Albumin: 2.8 g/dL — ABNORMAL LOW (ref 3.5–5.0)
Alkaline Phosphatase: 144 U/L — ABNORMAL HIGH (ref 38–126)
Anion gap: 8 (ref 5–15)
BUN: 65 mg/dL — ABNORMAL HIGH (ref 8–23)
CO2: 23 mmol/L (ref 22–32)
Calcium: 8.3 mg/dL — ABNORMAL LOW (ref 8.9–10.3)
Chloride: 114 mmol/L — ABNORMAL HIGH (ref 98–111)
Creatinine, Ser: 2.2 mg/dL — ABNORMAL HIGH (ref 0.61–1.24)
GFR, Estimated: 29 mL/min — ABNORMAL LOW (ref >60)
Glucose, Bld: 127 mg/dL — ABNORMAL HIGH (ref 70–99)
Potassium: 3.8 mmol/L (ref 3.5–5.1)
Sodium: 145 mmol/L (ref 135–145)
Total Bilirubin: 1.5 mg/dL — ABNORMAL HIGH (ref 0.3–1.2)
Total Protein: 5.6 g/dL — ABNORMAL LOW (ref 6.5–8.1)

## 2021-09-24 LAB — IRON AND TIBC
Iron: 39 ug/dL — ABNORMAL LOW (ref 45–182)
Saturation Ratios: 20 % (ref 17.9–39.5)
TIBC: 199 ug/dL — ABNORMAL LOW (ref 250–450)
UIBC: 160 ug/dL

## 2021-09-24 LAB — GLUCOSE, CAPILLARY
Glucose-Capillary: 104 mg/dL — ABNORMAL HIGH (ref 70–99)
Glucose-Capillary: 117 mg/dL — ABNORMAL HIGH (ref 70–99)
Glucose-Capillary: 158 mg/dL — ABNORMAL HIGH (ref 70–99)
Glucose-Capillary: 128 mg/dL — ABNORMAL HIGH (ref 70–99)

## 2021-09-24 LAB — FERRITIN: Ferritin: 137 ng/mL (ref 24–336)

## 2021-09-24 MED ORDER — SODIUM CHLORIDE 0.9 % IV SOLN
250.0000 mg | Freq: Every day | INTRAVENOUS | Status: AC
  Administered 2021-09-24 – 2021-09-27 (×4): 250 mg via INTRAVENOUS
  Filled 2021-09-24 (×4): qty 20

## 2021-09-24 NOTE — Progress Notes (Signed)
Occupational Therapy Treatment Patient Details Name: Ricky Barnett MRN: [id number] DOB: 1940/10/20 Today's Date: 09/24/2021   History of present illness RHYS LICHTY is a 81 y.o. male admitted 7/18 presenting with decreased appetite, combativeness.  His wife has noticed his blood sugar has been up, has not been eating or taking his medications like he should.  He has lost a lot of weight.  His dementia is getting worse.  He is getting more physical. PMH:  stage 3b CKD; dementia; DM; HTN; and pacemaker placement   OT comments  Pt making gradual progress towards OT goals this session. Pt continues to present with baseline cognitive impairments impacting pts ability to complete ADLs independently. Session focus on increasing activity tolerance, sitting balance as precursor to higher level functional mobility tasks and BADL reeducation. Pt currently requires MOD A for supine<>sit needing step by step cues for sequencing all parts of bed mobility. Pt noted to lean anteriorly and posteriorly once EOB reporting dizziness. Assisted pt back to supine to assess BP supine 107/62 ( 72) HR 83. Pt would continue to benefit from skilled occupational therapy while admitted and after d/c to address the below listed limitations in order to improve overall functional mobility and facilitate independence with BADL participation. DC plan remains appropriate, will follow acutely per POC.      Recommendations for follow up therapy are one component of a multi-disciplinary discharge planning process, led by the attending physician.  Recommendations may be updated based on patient status, additional functional criteria and insurance authorization.    Follow Up Recommendations  Skilled nursing-short term rehab (<3 hours/day)    Assistance Recommended at Discharge Frequent or constant Supervision/Assistance  Patient can return home with the following  A little help with walking and/or transfers;A lot of help with  bathing/dressing/bathroom;Direct supervision/assist for medications management   Equipment Recommendations  None recommended by OT    Recommendations for Other Services      Precautions / Restrictions Precautions Precautions: Fall Precaution Comments: bilateral mitts Restrictions Weight Bearing Restrictions: No       Mobility Bed Mobility Overal bed mobility: Needs Assistance Bed Mobility: Supine to Sit, Sit to Supine     Supine to sit: Mod assist, HOB elevated Sit to supine: Mod assist, HOB elevated   General bed mobility comments: MOD A for supine>sit with pt needing step by step cues to sequence bed mobility tasks, most assisted needed to elevate trunk into sitting, MOD A for sit>supine needing assist mostly to elevate BLEs back to bed    Transfers                   General transfer comment: deferred as pt reports dizziness     Balance Overall balance assessment: Needs assistance Sitting-balance support: Feet supported, Bilateral upper extremity supported Sitting balance-Leahy Scale: Poor Sitting balance - Comments: pt pushing back from EOB or leaning forward reporting dizziness needing up to MOD A for sitting balance Postural control: Posterior lean, Other (comment) (anterior lean)                                 ADL either performed or assessed with clinical judgement   ADL Overall ADL's : Needs assistance/impaired                     Lower Body Dressing: Min guard;Sitting/lateral leans Lower Body Dressing Details (indicate cue type and reason): pulling up socks  from EOB   Toilet Transfer Details (indicate cue type and reason): deferred transfer d/t reports of dizziness         Functional mobility during ADLs: Moderate assistance (bed mobility only) General ADL Comments: pt continues to present with baseline dementia limiting progression towards ADL goals, pt specifically limited by dizziness this session and impaired balance  from EOB    Extremity/Trunk Assessment Upper Extremity Assessment Upper Extremity Assessment: Generalized weakness   Lower Extremity Assessment Lower Extremity Assessment: Defer to PT evaluation   Cervical / Trunk Assessment Cervical / Trunk Assessment: Normal    Vision Baseline Vision/History: 0 No visual deficits (per gross assessment)     Perception Perception Perception: Not tested   Praxis Praxis Praxis: Not tested    Cognition Arousal/Alertness: Awake/alert Behavior During Therapy: WFL for tasks assessed/performed Overall Cognitive Status: History of cognitive impairments - at baseline                                 General Comments: calling out for his wife multiple times but able to redirect, pleasantly confused        Exercises      Shoulder Instructions       General Comments BP from supine 107/62 ( 72) HR 83 after reporting dizziness    Pertinent Vitals/ Pain       Pain Assessment Pain Assessment: Faces Faces Pain Scale: No hurt  Home Living                                          Prior Functioning/Environment              Frequency  Min 2X/week        Progress Toward Goals  OT Goals(current goals can now be found in the care plan section)  Progress towards OT goals: Progressing toward goals (gradually)  Acute Rehab OT Goals OT Goal Formulation: Patient unable to participate in goal setting Time For Goal Achievement: 10/03/21 Potential to Achieve Goals: [place] Discharge plan remains appropriate;Frequency remains appropriate    Co-evaluation                 AM-PAC OT "6 Clicks" Daily Activity     Outcome Measure   Help from another person eating meals?: A Little Help from another person taking care of personal grooming?: A Little Help from another person toileting, which includes using toliet, bedpan, or urinal?: A Lot Help from another person bathing (including washing, rinsing,  drying)?: A Lot Help from another person to put on and taking off regular upper body clothing?: A Little Help from another person to put on and taking off regular lower body clothing?: A Lot 6 Click Score: 15    End of Session    OT Visit Diagnosis: Unsteadiness on feet (R26.81);Other symptoms and signs involving cognitive function   Activity Tolerance Other (comment) (limited by dizziness)   Patient Left in bed;with call bell/phone within reach;with bed alarm set;with restraints reapplied   Nurse Communication          Time: 1194-1740 OT Time Calculation (min): 14 min  Charges: OT General Charges $OT Visit: 1 Visit OT Treatments $Self Care/Home Management : 8-22 mins  Harley Alto., COTA/L Acute Rehabilitation Services [phone]   Precious Haws 09/24/2021, 3:35 PM

## 2021-09-24 NOTE — Progress Notes (Signed)
PROGRESS NOTE    VENCENT Barnett  ZOX:[id number] DOB: 02/14/1941 DOA: 09/17/2021 PCP: Merrilee Seashore, MD   Brief Narrative: Ricky Barnett is a 81 y.o. male with a history of CKD stage IIIb, dementia, diabetes mellitus, hypertension and atrial fibrillation s/p pacemaker placement. Patient presented secondary to decreased appetite and combativeness in setting of dementia. Patient noted to have evidence of AKI and possible shock liver. Patient has been managed on IV fluids now with worsening AKI.   Assessment and Plan:  AKI on CKD stage IIIb Baseline creatinine of about 2. Creatine of 3.12 on admission. Complicated by hypotension and Entresto usage. Entresto held. IV fluids initiated. BNP >4,500. Patient does not appear to be overloaded on exam. He is +5.9 L although I/O are not completely accurate. Weight up to 169 lbs today. Lasix IV given x1 with initial improvement of creatinine and subsequent worsening -Strict in/out and daily weights -Daily C/BMP -Nephrology recommendations: IV fluids  Elevated LFTs AST/ALT of 1079/998 with associated total bilirubin of 2.4 an alkaline phosphatase of 249 on admission. Possibly secondary to shock liver from hypotension. Improving AST/ALT with increased total bilirubin. CT abdomen/pelvis without hepatobiliary etiology noted. Improving. -Daily CMP  Hyperkalemia Likely from worsening AKI. Initially improved and now worsened again. -Lokelma 10 g x1  Hypernatremia Would presume this is secondary to dehydration. Resolved with IV fluids.  Failure to thrive Thought to be secondary to dementia. Palliative care consulted. Dietitian consulted.   Dysphagia Speech therapy consulted and recommend a dysphagia 2 diet.  Dementia Noted. Patient has 24/7 caregiver assistance. PT/OT recommending SNF. -Continue Namenda  Diabetes mellitus, type 2 Patient is on metformin as an outpatient -Continue SSI  Primary hypertension -Holding Entresto -Nephrology  held Metoprolol  Hyperlipidemia Pravastatin held secondary to elevated AST/ALT  Atrial fibrillation -Continue Eliquis. Patient is s/p pacemaker placement. He is on Toprol XL as an outpatient for rate control which was held on admission because of bradycardia. -Nephrology held Toprol XL  Chronic combined systolic and diastolic heart failure Most recent Transthoracic Echocardiogram significant for EF of 20% with grade 2 diastolic dysfunction from 2019. Patient is on Entresto and Toprol XL as an outpatient, which were held on admission. -Strict in/out -Daily weights  DVT prophylaxis: Eliquis Code Status:   Code Status: DNR Family Communication: None at bedside Disposition Plan: Discharge to SNF pending improvement of AKI   Consultants:  Palliative care medicine Nephrology  Procedures:  None  Antimicrobials: None    Subjective: No issues documented overnight. Afebrile. Adequate urine output with unmeasured occurrences documented.  Objective: BP 104/86 (BP Location: Left Arm)   Pulse 73   Temp 97.8 F (36.6 C) (Axillary)   Resp 17   Ht 5\' 11"  (1.803 m)   Wt 76.6 kg   SpO2 100%   BMI 23.55 kg/m   Examination:  General exam: Appears calm and comfortable Respiratory system: Respiratory effort normal. Gastrointestinal system: Abdomen is non-distended   Data Reviewed: I have personally reviewed following labs and imaging studies  CBC Lab Results  Component Value Date   WBC 10.3 09/19/2021   RBC 3.43 (L) 09/19/2021   HGB 8.9 (L) 09/19/2021   HCT 27.6 (L) 09/19/2021   MCV 80.5 09/19/2021   MCH 25.9 (L) 09/19/2021   PLT 283 09/19/2021   MCHC 32.2 09/19/2021   RDW 16.3 (H) 09/19/2021   LYMPHSABS 0.9 09/18/2021   MONOABS 0.9 09/18/2021   EOSABS 0.0 09/18/2021   BASOSABS 0.0 81/19/1478     Last metabolic panel  Lab Results  Component Value Date   NA 145 09/24/2021   K 3.8 09/24/2021   CL 114 (H) 09/24/2021   CO2 23 09/24/2021   BUN 65 (H) 09/24/2021    CREATININE 2.20 (H) 09/24/2021   GLUCOSE 127 (H) 09/24/2021   GFRNONAA 29 (L) 09/24/2021   GFRAA 35 (L) 01/01/2018   CALCIUM 8.3 (L) 09/24/2021   PROT 5.6 (L) 09/24/2021   ALBUMIN 2.8 (L) 09/24/2021   BILITOT 1.5 (H) 09/24/2021   ALKPHOS 144 (H) 09/24/2021   AST 60 (H) 09/24/2021   ALT 311 (H) 09/24/2021   ANIONGAP 8 09/24/2021    GFR: Estimated Creatinine Clearance: 27.6 mL/min (A) (by C-G formula based on SCr of 2.2 mg/dL (H)).  No results found for this or any previous visit (from the past 240 hour(s)).    Radiology Studies: No results found.    LOS: 6 days    Cordelia Poche, MD [facility] 09/24/2021, 12:02 PM   If 7PM-7AM, please contact night-coverage [email or web address]

## 2021-09-24 NOTE — Plan of Care (Signed)
  Problem: Education: Goal: Knowledge of General Education information will improve Description: Including pain rating scale, medication(s)/side effects and non-pharmacologic comfort measures Outcome: Progressing   Problem: Coping: Goal: Level of anxiety will decrease Outcome: Progressing   Problem: Nutrition: Goal: Adequate nutrition will be maintained Outcome: Progressing

## 2021-09-24 NOTE — Care Management Important Message (Signed)
Important Message  Patient Details  Name: Ricky Barnett MRN: [id number] Date of Birth: Apr 16, 1940   Medicare Important Message Given:  Yes     [name] 09/24/2021, 3:44 PM

## 2021-09-24 NOTE — Progress Notes (Signed)
Subjective:  only 600 of UOP recorded- was a liter positive-  crt supposedly down to 2.2-  maybe a little more alert than yesterday-  pleasant dementia   Objective Vital signs in last 24 hours: Vitals:   09/23/21 2036 09/24/21 0412 09/24/21 0414 09/24/21 0804  BP: (!) 142/113  108/64 104/86  Pulse: 78  62 73  Resp: 18  17 17   Temp: 98 F (36.7 C)  98.1 F (36.7 C) 97.8 F (36.6 C)  TempSrc: Oral  Oral Axillary  SpO2: 99%  98% 100%  Weight:  76.6 kg    Height:       Weight change: 1.4 kg  Intake/Output Summary (Last 24 hours) at 09/24/2021 1050 Last data filed at 09/24/2021 1002 Gross per 24 hour  Intake 1484.92 ml  Output 600 ml  Net 884.92 ml    Assessment/Plan: 81 year old BM with dementia and baseline CKD-  now presents with mainly behavioral issues/FTT-  but also A on CRF  1.Renal- baseline CKD with best crt in 2023 of 2.18, proteinuria and at least one kidney with echogenic cortex-  would be expected to see progression in this setting.  Now with some A on CRF as well as BUN up.  No hydro.  BP is soft and I think if anything is dry.  I have decreased his metoprolol and am hydrating him gently.  He is not a candidate for dialysis due to dementia but fortunately there are not indications at this time.  Supposedly much improved from yesterday -   not sure I completely believe-  still does not seem wet-  will continue hydration and follow up labs tomorrow 2. Hypertension/volume  - BP soft and if anything dry-  dec metoprolol and gently hydrate-   3. Hyperkalemia-  has been intermittent-  gave lokelma 7/24 -  acidosis not helping-  giving bicarb as well-  has resolved 4. Metabolic acidosis due to #1-  start sodium bicarb 5. Anemia  - severe as well-  likely due to CKD-   iron stores low, will replete and have added ESA     Louis Meckel    Labs: Basic Metabolic Panel: Recent Labs  Lab 09/22/21 1654 09/23/21 0115 09/24/21 0254  NA 145 146* 145  K 4.5 6.2* 3.8  CL  113* 118* 114*  CO2 24 21* 23  GLUCOSE 143* 135* 127*  BUN 77* 81* 65*  CREATININE 2.61* 3.24* 2.20*  CALCIUM 8.5* 8.5* 8.3*   Liver Function Tests: Recent Labs  Lab 09/22/21 0304 09/23/21 0115 09/24/21 0254  AST 156* 97* 60*  ALT 554* 389* 311*  ALKPHOS 180* 153* 144*  BILITOT 2.7* 2.2* 1.5*  PROT 5.9* 5.5* 5.6*  ALBUMIN 2.9* 2.6* 2.8*   No results for input(s): "LIPASE", "AMYLASE" in the last 168 hours. No results for input(s): "AMMONIA" in the last 168 hours. CBC: Recent Labs  Lab 09/18/21 0031 09/19/21 0652  WBC 9.7 10.3  NEUTROABS 7.8*  --   HGB 8.8* 8.9*  HCT 27.5* 27.6*  MCV 80.2 80.5  PLT 303 283   Cardiac Enzymes: Recent Labs  Lab 09/18/21 0245  CKTOTAL 83   CBG: Recent Labs  Lab 09/23/21 0743 09/23/21 1149 09/23/21 1638 09/23/21 2039 09/24/21 0808  GLUCAP 113* 159* 179* 123* 104*    Iron Studies:  Recent Labs    09/24/21 0254  IRON 39*  TIBC 199*  FERRITIN 137   Studies/Results: No results found. Medications: Infusions:  sodium chloride 75 mL/hr  at 09/24/21 0419    Scheduled Medications:  (feeding supplement) PROSource Plus  30 mL Oral TID BM   apixaban  2.5 mg Oral BID   darbepoetin (ARANESP) injection - NON-DIALYSIS  150 mcg Subcutaneous Q Mon-1800   docusate sodium  100 mg Oral BID   feeding supplement  237 mL Oral TID BM   hydrocerin   Topical BID   insulin aspart  0-9 Units Subcutaneous TID WC   memantine  5 mg Oral BID   metoprolol succinate  25 mg Oral Daily   multivitamin with minerals  1 tablet Oral Daily   sodium bicarbonate  650 mg Oral BID   tamsulosin  0.4 mg Oral QPC supper    have reviewed scheduled and prn medications.  Physical Exam: General: alert, pleasant dementia Heart: RRR Lungs: mostly clear Abdomen: soft, non tender Extremities: no edema    09/24/2021,10:50 AM  LOS: 6 days

## 2021-09-24 NOTE — TOC Progression Note (Signed)
Transition of Care [facility]) - Progression Note    Patient Details  Name: Ricky Barnett MRN: [id number] Date of Birth: 08-29-1940  Transition of Care [facility]) CM/SW [place], [place] Phone Number: 09/24/2021, 10:16 AM  Clinical Narrative:    CSW was informed from [place] at [facility] that they are not longer able to accept pt after DON reviewed. Pt has no other offers, pt DC pending bed offers. CSW will continue to follow for DC planning needs.   Expected Discharge Plan: [place] Barriers to Discharge: Continued Medical Work up, SNF Pending bed offer, Ship broker  Expected Discharge Plan and Services Expected Discharge Plan: [place] Choice: [place] arrangements for the past 2 months: Single Family Home                                       Social Determinants of Health (SDOH) Interventions    Readmission Risk Interventions     No data to display

## 2021-09-25 DIAGNOSIS — F03918 Unspecified dementia, unspecified severity, with other behavioral disturbance: Secondary | ICD-10-CM

## 2021-09-25 DIAGNOSIS — E870 Hyperosmolality and hypernatremia: Secondary | ICD-10-CM

## 2021-09-25 DIAGNOSIS — D631 Anemia in chronic kidney disease: Secondary | ICD-10-CM

## 2021-09-25 DIAGNOSIS — N179 Acute kidney failure, unspecified: Secondary | ICD-10-CM | POA: Diagnosis not present

## 2021-09-25 DIAGNOSIS — I5042 Chronic combined systolic (congestive) and diastolic (congestive) heart failure: Secondary | ICD-10-CM | POA: Diagnosis not present

## 2021-09-25 DIAGNOSIS — R1310 Dysphagia, unspecified: Secondary | ICD-10-CM

## 2021-09-25 DIAGNOSIS — E872 Acidosis, unspecified: Secondary | ICD-10-CM

## 2021-09-25 DIAGNOSIS — E875 Hyperkalemia: Secondary | ICD-10-CM

## 2021-09-25 DIAGNOSIS — N189 Chronic kidney disease, unspecified: Secondary | ICD-10-CM

## 2021-09-25 DIAGNOSIS — N184 Chronic kidney disease, stage 4 (severe): Secondary | ICD-10-CM

## 2021-09-25 DIAGNOSIS — E43 Unspecified severe protein-calorie malnutrition: Secondary | ICD-10-CM | POA: Insufficient documentation

## 2021-09-25 LAB — GLUCOSE, CAPILLARY
Glucose-Capillary: 85 mg/dL (ref 70–99)
Glucose-Capillary: 127 mg/dL — ABNORMAL HIGH (ref 70–99)
Glucose-Capillary: 135 mg/dL — ABNORMAL HIGH (ref 70–99)
Glucose-Capillary: 90 mg/dL (ref 70–99)

## 2021-09-25 LAB — RENAL FUNCTION PANEL
Albumin: 2.9 g/dL — ABNORMAL LOW (ref 3.5–5.0)
Anion gap: 9 (ref 5–15)
BUN: 59 mg/dL — ABNORMAL HIGH (ref 8–23)
CO2: 20 mmol/L — ABNORMAL LOW (ref 22–32)
Calcium: 8.6 mg/dL — ABNORMAL LOW (ref 8.9–10.3)
Chloride: 120 mmol/L — ABNORMAL HIGH (ref 98–111)
Creatinine, Ser: 2.35 mg/dL — ABNORMAL HIGH (ref 0.61–1.24)
GFR, Estimated: 27 mL/min — ABNORMAL LOW (ref >60)
Glucose, Bld: 136 mg/dL — ABNORMAL HIGH (ref 70–99)
Phosphorus: 3.2 mg/dL (ref 2.5–4.6)
Potassium: 4.5 mmol/L (ref 3.5–5.1)
Sodium: 149 mmol/L — ABNORMAL HIGH (ref 135–145)

## 2021-09-25 LAB — CBC
HCT: 32.2 % — ABNORMAL LOW (ref 39.0–52.0)
Hemoglobin: 10.2 g/dL — ABNORMAL LOW (ref 13.0–17.0)
MCH: 26.8 pg (ref 26.0–34.0)
MCHC: 31.7 g/dL (ref 30.0–36.0)
MCV: 84.5 fL (ref 80.0–100.0)
Platelets: 257 10*3/uL (ref 150–400)
RBC: 3.81 MIL/uL — ABNORMAL LOW (ref 4.22–5.81)
RDW: 22.1 % — ABNORMAL HIGH (ref 11.5–15.5)
WBC: 9.3 10*3/uL (ref 4.0–10.5)
nRBC: 2 % — ABNORMAL HIGH (ref 0.0–0.2)

## 2021-09-25 LAB — MRSA NEXT GEN BY PCR, NASAL: MRSA by PCR Next Gen: NOT DETECTED

## 2021-09-25 MED ORDER — SODIUM CHLORIDE 0.9 % IV BOLUS
500.0000 mL | Freq: Once | INTRAVENOUS | Status: AC
  Administered 2021-09-25: 500 mL via INTRAVENOUS

## 2021-09-25 MED ORDER — SODIUM CHLORIDE 0.9 % IV SOLN
INTRAVENOUS | Status: DC
Start: 2021-09-25 — End: 2021-09-26

## 2021-09-25 NOTE — Assessment & Plan Note (Signed)
Cr slightly better at 2 mg/dL today, near patient's baseline.    - Hold nephrotoxins

## 2021-09-25 NOTE — Hospital Course (Signed)
Ricky Barnett is a 81 y.o. male with a history of CKD stage IIIb, dementia, diabetes mellitus, hypertension and atrial fibrillation s/p pacemaker placement. Patient presented secondary to decreased appetite and combativeness in setting of dementia. Patient noted to have evidence of AKI and possible shock liver. Patient has been managed on IV fluids now with worsening AKI.

## 2021-09-25 NOTE — Assessment & Plan Note (Signed)
-   Continue apixaban, metoprolol

## 2021-09-25 NOTE — Assessment & Plan Note (Signed)
-   Continue memantine and PRN Haldol

## 2021-09-25 NOTE — Assessment & Plan Note (Signed)
-   Consult Palliative Care 

## 2021-09-25 NOTE — Assessment & Plan Note (Signed)
Appears dehydrated - Hold diuretics, hold Entresto

## 2021-09-25 NOTE — Assessment & Plan Note (Signed)
-   Continue aranesp and iron infusion

## 2021-09-25 NOTE — Progress Notes (Signed)
  Progress Note   Patient: Ricky Barnett LOV:[id number] DOB: 1941/01/10 DOA: 09/17/2021     7 DOS: the patient was seen and examined on 09/25/2021 at 11:35AM      Brief hospital course: Ricky Barnett is a 81 y.o. male with a history of CKD stage IIIb, dementia, diabetes mellitus, hypertension and atrial fibrillation s/p pacemaker placement. Patient presented secondary to decreased appetite and combativeness in setting of dementia. Patient noted to have evidence of AKI and possible shock liver. Patient has been managed on IV fluids now with worsening AKI.     Assessment and Plan: * AKI (acute kidney injury) ([place]) Cr down to 2.2 today.  Nephrology suspect this was pre-renal in nature. - Continue IV fluids - Hold nephrotoxins    Hypernatremia Na slightly better - Continue IV fliuds  Hyperkalemia Resolved  Metabolic acidosis Improved - Continue Bicarb  Dementia with behavioral disturbance (HCC) - Continue memantine and PRN Haldol  Protein-calorie malnutrition, severe As evidenced by severe fat depletion, severe muscle depletion in the setting of advancing dementia.  Dysphagia - Consult SLP  Anemia due to chronic kidney disease - Continue aranesp and iron infusion  CKD (chronic kidney disease), stage IV (HCC) Baseline Cr 2.2 and proteinuria.  CKD IIIb documented, which is ruled out.  GFR at baseline appears to be <30.  Elevated LFTs Due to dehydration  Failure to thrive in adult - Consult Palliative Care  Chronic combined systolic and diastolic heart failure (HCC) Appears dehydrated - Hold diuretics, hold Entresto  Paroxysmal atrial fibrillation (HCC) - Continue apixaban, metoprolol  Non-insulin dependent type 2 diabetes mellitus (HCC) Glucose normal - Continue SS corrections  Essential hypertension BP controlled - Continue metoprolol          Subjective: Patient has no complaints, he is restless, wants to go home.  Fever, respiratory distress,  vomiting.  He has been somewhat restless and uncomfortable.     Physical Exam: Vitals:   09/25/21 0500 09/25/21 0534 09/25/21 0845 09/25/21 1536  BP:  132/62 121/68 106/88  Pulse:  (!) 50 85 80  Resp:  18 18 18   Temp:  97.9 F (36.6 C) 97.8 F (36.6 C) 98.4 F (36.9 C)  TempSrc:  Oral    SpO2:  100% 100% 100%  Weight: 77.7 kg     Height:       Elderly adult male, lying in bed, restless, no acute distress RRR, no murmurs, no peripheral edema Respiratory rate normal, lungs clear without rales or wheezes Abdomen soft no tenderness palpation or guarding Diffuse soft subcutaneous muscle mass and fat Oriented to self only, speech fluent, moves upper extremities with severe generalized weakness  Data Reviewed: Basic metabolic panel notable for creatinine down to 2.2, sodium down to 145, AST and ALT improved, potassium down to 3.8, total bilirubin down to 1.5  Family Communication: Called wife, both numbers listed, twice, no answer    Disposition: Status is: Inpatient         Author: Edwin Dada, MD 09/25/2021 3:55 PM  For on call review [email or web address].

## 2021-09-25 NOTE — Assessment & Plan Note (Addendum)
Na worse yesterday aftenroon, this morning Na up to 149 - Start hypotonic fluids

## 2021-09-25 NOTE — Progress Notes (Signed)
Nutrition Follow-up  DOCUMENTATION CODES:  Severe malnutrition in context of chronic illness  INTERVENTION:  Continue current diet as ordered, encourage PO intake Nursing to assist with feeding Discontinue prosource, pt not consuming Per palliative discussion, wife not interested in feed tube for pt, which is appropriate given his dementia and advanced age Would recommend revisiting [place] conversation as PO is poor (~15% intake)  NUTRITION DIAGNOSIS:  Severe Malnutrition related to chronic illness (dementia) as evidenced by severe fat depletion, severe muscle depletion. - new dx 7/26  GOAL:  Patient will meet greater than or equal to 90% of their needs - not progressing, poor intake, limited consumption of supplements.   MONITOR:  PO intake, Supplement acceptance  REASON FOR ASSESSMENT:   Consult Assessment of nutrition requirement/status  ASSESSMENT:  Pt with medical history significant of stage 3b CKD; dementia; DM; HTN; and pacemaker placement presenting with decreased appetite, combativeness.  Met with pt in room. Currently confused and agitated, trying to get mittens off of hands. Pt has significant muscle and fat deficits on exam consistent with long term poor nutrition.   Ensure noted at bedside with straw, but very little has been consumed. ? Accuracy of current weight.  Average Meal Intake: 7/19-7/26: 15% intake x 13 recorded meals  Nutritionally Relevant Medications: Scheduled Meds:  (feeding supplement) PROSource Plus  30 mL Oral TID BM   docusate sodium  100 mg Oral BID   feeding supplement  237 mL Oral TID BM   insulin aspart  0-9 Units Subcutaneous TID WC   memantine  5 mg Oral BID   multivitamin with minerals  1 tablet Oral Daily   sodium bicarbonate  650 mg Oral BID   Continuous Infusions:  sodium chloride 100 mL/hr at 09/25/21 1435   ferric gluconate (FERRLECIT) IVPB 250 mg (09/25/21 1002)   Labs Reviewed: Na 149, chloride 120 BUN 59, creatinine  2.35 CBG ranges from 85-158 mg/dL over the last 24 hours  NUTRITION - FOCUSED PHYSICAL EXAM: Flowsheet Row Most Recent Value  Orbital Region Severe depletion  Upper Arm Region Severe depletion  Thoracic and Lumbar Region Moderate depletion  Buccal Region Moderate depletion  Temple Region Severe depletion  Clavicle Bone Region Moderate depletion  Clavicle and Acromion Bone Region Severe depletion  Scapular Bone Region Moderate depletion  Dorsal Hand Mild depletion  Patellar Region Severe depletion  Anterior Thigh Region Severe depletion  Posterior Calf Region Severe depletion  Edema (RD Assessment) None  Hair Reviewed  Eyes Reviewed  Mouth Reviewed  Skin Reviewed  Nails Reviewed    Diet Order:   Diet Order             DIET DYS 2 Room service appropriate? Yes; Fluid consistency: Thin  Diet effective now                   EDUCATION NEEDS:   Not appropriate for education at this time  Skin:  Skin Assessment: Reviewed RN Assessment  Last BM:  Unknown bowel regimen in place  Height:  Ht Readings from Last 1 Encounters:  09/17/21 $RemoveB'5\' 11"'CYVTtPpc$  (1.803 m)    Weight:  Wt Readings from Last 1 Encounters:  09/25/21 77.7 kg    Ideal Body Weight:  78.2 kg  BMI:  Body mass index is 23.89 kg/m.  Estimated Nutritional Needs:  Kcal:  1850-2050 Protein:  90-105 grams Fluid:  > 1.8 L    Ranell Patrick, RD, LDN Clinical Dietitian RD pager # available in AMION  After hours/weekend  pager # available in [facility]

## 2021-09-25 NOTE — Assessment & Plan Note (Signed)
As evidenced by severe fat depletion, severe muscle depletion in the setting of advancing dementia. - Continue feeding supplements

## 2021-09-25 NOTE — Assessment & Plan Note (Signed)
Glucose normal - Continue SS corrections 

## 2021-09-25 NOTE — Assessment & Plan Note (Addendum)
Baseline Cr 2.2 and proteinuria.  CKD IIIb documented, which is ruled out.  GFR at baseline appears to be <30.

## 2021-09-25 NOTE — Assessment & Plan Note (Signed)
BP controlled.  Continue metoprolol 

## 2021-09-25 NOTE — Assessment & Plan Note (Signed)
Due to dehydration, now resolved - Check LFts again

## 2021-09-25 NOTE — Progress Notes (Signed)
Subjective:  950 of UOP recorded- 400 cc  positive-  no labs this AM ( I guess took a swing at the phlebotomist)-  stable  pleasant dementia being fed  Objective Vital signs in last 24 hours: Vitals:   09/24/21 2041 09/25/21 0500 09/25/21 0534 09/25/21 0845  BP: 124/72  132/62 121/68  Pulse: 75  (!) 50 85  Resp: 18  18 18   Temp: 97.8 F (36.6 C)  97.9 F (36.6 C) 97.8 F (36.6 C)  TempSrc: Oral  Oral   SpO2: 99%  100% 100%  Weight:  77.7 kg    Height:       Weight change: 1.1 kg  Intake/Output Summary (Last 24 hours) at 09/25/2021 0848 Last data filed at 09/25/2021 0600 Gross per 24 hour  Intake 1327.43 ml  Output 950 ml  Net 377.43 ml    Assessment/Plan: 81 year old BM with dementia and baseline CKD-  now presents with mainly behavioral issues/FTT-  but also A on CRF  1.Renal- baseline CKD with best crt in 2023 of 2.18, proteinuria and at least one kidney with echogenic cortex-  would be expected to see progression in this setting.  Now with some A on CRF as well as BUN up.  No hydro.  BP is soft and I think if anything was dry.  I  decreased his metoprolol and hydrated him gently.  He is not a candidate for dialysis due to dementia but fortunately there are not indications at this time.  Kidney function supposedly much improved from 7/24 to 7/25 -   has stayed there 2. Hypertension/volume  - BP soft and if anything dry-  dec metoprolol and gently hydrate-   lost IV so will stop IVF. Patient may have a difficult time maintaining his volume status in the long run 3. Hyperkalemia-  has been intermittent-  gave lokelma 7/24 -  acidosis not helping-  giving bicarb as well-  has resolved 4. Metabolic acidosis due to #1-  start sodium bicarb 5. Anemia  - severe as well-  likely due to CKD-   iron stores low,  repleting and have added ESA   Renal function really close to his baseline-  I think it was all volume depletion as that was the intervention which improved things.  Pt may have  trouble maintaining his volume status as an OP FYI-  not sure if something like TF and free water admin would be indicated.  Renal will sign off -  call with questions    Louis Meckel    Labs: Basic Metabolic Panel: Recent Labs  Lab 09/23/21 0115 09/24/21 0254 09/25/21 1021  NA 146* 145 149*  K 6.2* 3.8 4.5  CL 118* 114* 120*  CO2 21* 23 20*  GLUCOSE 135* 127* 136*  BUN 81* 65* 59*  CREATININE 3.24* 2.20* 2.35*  CALCIUM 8.5* 8.3* 8.6*  PHOS  --   --  3.2   Liver Function Tests: Recent Labs  Lab 09/22/21 0304 09/23/21 0115 09/24/21 0254  AST 156* 97* 60*  ALT 554* 389* 311*  ALKPHOS 180* 153* 144*  BILITOT 2.7* 2.2* 1.5*  PROT 5.9* 5.5* 5.6*  ALBUMIN 2.9* 2.6* 2.8*   No results for input(s): "LIPASE", "AMYLASE" in the last 168 hours. No results for input(s): "AMMONIA" in the last 168 hours. CBC: Recent Labs  Lab 09/19/21 0652 09/25/21 1021  WBC 10.3 9.3  HGB 8.9* 10.2*  HCT 27.6* 32.2*  MCV 80.5 84.5  PLT 283 257  Cardiac Enzymes: No results for input(s): "CKTOTAL", "CKMB", "CKMBINDEX", "TROPONINI" in the last 168 hours.  CBG: Recent Labs  Lab 09/24/21 0808 09/24/21 1152 09/24/21 1652 09/24/21 2043 09/25/21 0845  GLUCAP 104* 117* 158* 128* 85    Iron Studies:  Recent Labs    09/24/21 0254  IRON 39*  TIBC 199*  FERRITIN 137   Studies/Results: No results found. Medications: Infusions:  ferric gluconate (FERRLECIT) IVPB Stopped (09/24/21 1454)    Scheduled Medications:  (feeding supplement) PROSource Plus  30 mL Oral TID BM   apixaban  2.5 mg Oral BID   darbepoetin (ARANESP) injection - NON-DIALYSIS  150 mcg Subcutaneous Q Mon-1800   docusate sodium  100 mg Oral BID   feeding supplement  237 mL Oral TID BM   hydrocerin   Topical BID   insulin aspart  0-9 Units Subcutaneous TID WC   memantine  5 mg Oral BID   metoprolol succinate  25 mg Oral Daily   multivitamin with minerals  1 tablet Oral Daily   sodium bicarbonate  650  mg Oral BID   tamsulosin  0.4 mg Oral QPC supper    have reviewed scheduled and prn medications.  Physical Exam: General: alert, pleasant dementia Heart: RRR Lungs: mostly clear Abdomen: soft, non tender Extremities: maybe trace edema    09/25/2021,8:48 AM  LOS: 7 days

## 2021-09-25 NOTE — Assessment & Plan Note (Signed)
Resolved - Continue Bicarb

## 2021-09-25 NOTE — Assessment & Plan Note (Signed)
-   Consult SLP

## 2021-09-25 NOTE — Assessment & Plan Note (Signed)
Resolved

## 2021-09-26 ENCOUNTER — Inpatient Hospital Stay (HOSPITAL_COMMUNITY): Payer: Medicare Other

## 2021-09-26 DIAGNOSIS — G9341 Metabolic encephalopathy: Secondary | ICD-10-CM

## 2021-09-26 DIAGNOSIS — N400 Enlarged prostate without lower urinary tract symptoms: Secondary | ICD-10-CM

## 2021-09-26 DIAGNOSIS — N179 Acute kidney failure, unspecified: Secondary | ICD-10-CM | POA: Diagnosis not present

## 2021-09-26 LAB — GLUCOSE, CAPILLARY
Glucose-Capillary: 105 mg/dL — ABNORMAL HIGH (ref 70–99)
Glucose-Capillary: 118 mg/dL — ABNORMAL HIGH (ref 70–99)
Glucose-Capillary: 118 mg/dL — ABNORMAL HIGH (ref 70–99)
Glucose-Capillary: 144 mg/dL — ABNORMAL HIGH (ref 70–99)

## 2021-09-26 LAB — BASIC METABOLIC PANEL
Anion gap: 7 (ref 5–15)
BUN: 54 mg/dL — ABNORMAL HIGH (ref 8–23)
CO2: 22 mmol/L (ref 22–32)
Calcium: 8.5 mg/dL — ABNORMAL LOW (ref 8.9–10.3)
Chloride: 119 mmol/L — ABNORMAL HIGH (ref 98–111)
Creatinine, Ser: 1.99 mg/dL — ABNORMAL HIGH (ref 0.61–1.24)
GFR, Estimated: 33 mL/min — ABNORMAL LOW (ref >60)
Glucose, Bld: 125 mg/dL — ABNORMAL HIGH (ref 70–99)
Potassium: 4 mmol/L (ref 3.5–5.1)
Sodium: 148 mmol/L — ABNORMAL HIGH (ref 135–145)

## 2021-09-26 LAB — CBC
HCT: 31.9 % — ABNORMAL LOW (ref 39.0–52.0)
Hemoglobin: 10 g/dL — ABNORMAL LOW (ref 13.0–17.0)
MCH: 26.9 pg (ref 26.0–34.0)
MCHC: 31.3 g/dL (ref 30.0–36.0)
MCV: 85.8 fL (ref 80.0–100.0)
Platelets: 281 10*3/uL (ref 150–400)
RBC: 3.72 MIL/uL — ABNORMAL LOW (ref 4.22–5.81)
RDW: 22.4 % — ABNORMAL HIGH (ref 11.5–15.5)
WBC: 18.7 10*3/uL — ABNORMAL HIGH (ref 4.0–10.5)
nRBC: 1.9 % — ABNORMAL HIGH (ref 0.0–0.2)

## 2021-09-26 MED ORDER — QUETIAPINE FUMARATE 50 MG PO TABS
25.0000 mg | ORAL_TABLET | Freq: Every day | ORAL | Status: DC
  Administered 2021-09-26 – 2021-09-27 (×2): 25 mg via ORAL
  Filled 2021-09-26 (×2): qty 1

## 2021-09-26 MED ORDER — SODIUM CHLORIDE 0.45 % IV SOLN: INTRAVENOUS | Status: DC

## 2021-09-26 NOTE — Progress Notes (Addendum)
PT Cancellation Note  Patient Details Name: Ricky Barnett MRN: [id number] DOB: 1940-05-06   Cancelled Treatment:    Reason Eval/Treat Not Completed: Patient declined, no reason specified. Pt in bed trying to get his mittens off of his hands. Nursing requesting pt to stay in bed and pt refusing bed exercises.  Donna Bernard, PT   Donna Bernard 09/26/2021, 2:53 PM

## 2021-09-26 NOTE — Assessment & Plan Note (Signed)
At baseline, aptient is pleasantly confused.  Here in the hospital, he has had worsneing delirium.  I do not suspect infection.  I suspect this is hospital related delirium, probably exacerbated by dehydration/hypernatremia and renal failure. - IV fluids - Palliative care - Avoid sedating medicines - Gentle restraints today

## 2021-09-26 NOTE — Assessment & Plan Note (Signed)
-   Continue Flomax 

## 2021-09-26 NOTE — Progress Notes (Signed)
Progress Note   Patient: Ricky Barnett HUD:[id number] DOB: Apr 03, 1940 DOA: 09/17/2021     8 DOS: the patient was seen and examined on 09/26/2021 at 9:55AM      Brief hospital course: KYLIL SWOPES is a 81 y.o. male with a history of CKD stage IIIb, dementia, diabetes mellitus, hypertension and atrial fibrillation s/p pacemaker placement. Patient presented secondary to decreased appetite and combativeness in setting of dementia. Patient noted to have evidence of AKI and possible shock liver. Patient has been managed on IV fluids now with worsening AKI.     Assessment and Plan: * AKI (acute kidney injury) ([place]) Cr slightly better at 2 mg/dL today, near patient's baseline.    - Hold nephrotoxins    Hypernatremia Na worse yesterday aftenroon, this morning Na up to 149 - Start hypotonic fluids  Hyperkalemia Resolved  Metabolic acidosis Resolved - Continue Bicarb  Acute metabolic encephalopathy At baseline, aptient is pleasantly confused.  Here in the hospital, he has had worsneing delirium.  I do not suspect infection.  I suspect this is hospital related delirium, probably exacerbated by dehydration/hypernatremia and renal failure. - IV fluids - Palliative care - Avoid sedating medicines - Gentle restraints today  BPH (benign prostatic hyperplasia) - Continue Flomax  Dementia with behavioral disturbance (HCC) - Continue memantine    Protein-calorie malnutrition, severe As evidenced by severe fat depletion, severe muscle depletion in the setting of advancing dementia. - Continue feeding supplements  Dysphagia - Consult SLP  Anemia due to chronic kidney disease - Continue aranesp and iron infusion  CKD (chronic kidney disease), stage IV (HCC) Baseline Cr 2.2 and proteinuria.  CKD IIIb documented, which is ruled out.  GFR at baseline appears to be <30.  Elevated LFTs Due to dehydration, now resolved - Check LFts again  Failure to thrive in adult - Consult  Palliative Care  Chronic combined systolic and diastolic heart failure (HCC) Appears dehydrated - Hold diuretics, hold Entresto  Paroxysmal atrial fibrillation (HCC) - Continue apixaban, metoprolol  Non-insulin dependent type 2 diabetes mellitus (HCC) Glucose normal - Continue SS corrections  Essential hypertension BP controlled - Continue metoprolol          Subjective: Patient very agitated overnight, and this morning, required IV Haldol this morning, since then has been more restless and uncomfortable, agitated and directable.  No fever, no vomiting, no localizing complaints.  Patient is only moaning and does not answer questions intelligibly     Physical Exam: Vitals:   09/26/21 0402 09/26/21 0416 09/26/21 0811 09/26/21 0820  BP: 133/63  (!) 143/82   Pulse: (!) 110  62   Resp: 15  16   Temp: (!) 97.5 F (36.4 C)   (!) 97.4 F (36.3 C)  TempSrc: Oral   Axillary  SpO2: 100%  100%   Weight:  74.3 kg    Height:       Thin elderly adult male, lying in bed, restless, and does not make eye contact Tachycardic, systolic murmur noted, no peripheral edema Respiratory rate normal, lungs clear without rales or wheezes Abdomen soft, no tenderness to palpation or guarding in all quadrants Severe diffuse loss of subcutaneous muscle mass and fat Attention diminished, does not make eye contact does not follow commands, moves all extremities with generalized weakness  Data Reviewed: Basic metabolic panel notable for sodium at 148, creatinine 1.99, glucose normal White blood cells up to 18 Hemoglobin 10, no change  Family Communication: Called wife, no answer    Disposition:  Status is: Inpatient         Author: Edwin Dada, MD 09/26/2021 10:09 AM  For on call review [email or web address].

## 2021-09-26 NOTE — Plan of Care (Signed)
  Problem: Pain Managment: Goal: General experience of comfort will improve Outcome: Progressing   Problem: Safety: Goal: Ability to remain free from injury will improve Outcome: Progressing   Problem: Nutrition: Goal: Adequate nutrition will be maintained Outcome: Not Progressing   

## 2021-09-27 ENCOUNTER — Other Ambulatory Visit (HOSPITAL_COMMUNITY): Payer: Medicare Other

## 2021-09-27 DIAGNOSIS — Z7189 Other specified counseling: Secondary | ICD-10-CM | POA: Diagnosis not present

## 2021-09-27 DIAGNOSIS — N184 Chronic kidney disease, stage 4 (severe): Secondary | ICD-10-CM | POA: Diagnosis not present

## 2021-09-27 DIAGNOSIS — N179 Acute kidney failure, unspecified: Secondary | ICD-10-CM | POA: Diagnosis not present

## 2021-09-27 DIAGNOSIS — N189 Chronic kidney disease, unspecified: Secondary | ICD-10-CM | POA: Diagnosis not present

## 2021-09-27 DIAGNOSIS — I5042 Chronic combined systolic (congestive) and diastolic (congestive) heart failure: Secondary | ICD-10-CM | POA: Diagnosis not present

## 2021-09-27 DIAGNOSIS — F03918 Unspecified dementia, unspecified severity, with other behavioral disturbance: Secondary | ICD-10-CM | POA: Diagnosis not present

## 2021-09-27 DIAGNOSIS — Z515 Encounter for palliative care: Secondary | ICD-10-CM | POA: Diagnosis not present

## 2021-09-27 LAB — CBC
HCT: 32.3 % — ABNORMAL LOW (ref 39.0–52.0)
Hemoglobin: 10.3 g/dL — ABNORMAL LOW (ref 13.0–17.0)
MCH: 26.6 pg (ref 26.0–34.0)
MCHC: 31.9 g/dL (ref 30.0–36.0)
MCV: 83.5 fL (ref 80.0–100.0)
Platelets: 185 10*3/uL (ref 150–400)
RBC: 3.87 MIL/uL — ABNORMAL LOW (ref 4.22–5.81)
RDW: 23.2 % — ABNORMAL HIGH (ref 11.5–15.5)
WBC: 13.2 10*3/uL — ABNORMAL HIGH (ref 4.0–10.5)
nRBC: 0 % (ref 0.0–0.2)

## 2021-09-27 LAB — COMPREHENSIVE METABOLIC PANEL
ALT: 169 U/L — ABNORMAL HIGH (ref 0–44)
AST: 37 U/L (ref 15–41)
Albumin: 2.7 g/dL — ABNORMAL LOW (ref 3.5–5.0)
Alkaline Phosphatase: 116 U/L (ref 38–126)
Anion gap: 7 (ref 5–15)
BUN: 53 mg/dL — ABNORMAL HIGH (ref 8–23)
CO2: 23 mmol/L (ref 22–32)
Calcium: 8.6 mg/dL — ABNORMAL LOW (ref 8.9–10.3)
Chloride: 118 mmol/L — ABNORMAL HIGH (ref 98–111)
Creatinine, Ser: 2.05 mg/dL — ABNORMAL HIGH (ref 0.61–1.24)
GFR, Estimated: 32 mL/min — ABNORMAL LOW (ref >60)
Glucose, Bld: 126 mg/dL — ABNORMAL HIGH (ref 70–99)
Potassium: 4.7 mmol/L (ref 3.5–5.1)
Sodium: 148 mmol/L — ABNORMAL HIGH (ref 135–145)
Total Bilirubin: 1.3 mg/dL — ABNORMAL HIGH (ref 0.3–1.2)
Total Protein: 5.6 g/dL — ABNORMAL LOW (ref 6.5–8.1)

## 2021-09-27 LAB — GLUCOSE, CAPILLARY
Glucose-Capillary: 129 mg/dL — ABNORMAL HIGH (ref 70–99)
Glucose-Capillary: 112 mg/dL — ABNORMAL HIGH (ref 70–99)
Glucose-Capillary: 124 mg/dL — ABNORMAL HIGH (ref 70–99)
Glucose-Capillary: 106 mg/dL — ABNORMAL HIGH (ref 70–99)

## 2021-09-27 NOTE — Progress Notes (Signed)
  Progress Note   Patient: Ricky Barnett IOX:[id number] DOB: Mar 10, 1940 DOA: 09/17/2021     9 DOS: the patient was seen and examined on 09/27/2021        Brief hospital course: Ricky Barnett is a 81 y.o. male with a history of CKD stage IIIb, dementia, diabetes mellitus, hypertension and atrial fibrillation s/p pacemaker placement. Patient presented secondary to decreased appetite and combativeness in setting of dementia. Patient noted to have evidence of AKI and possible shock liver. Patient has been managed on IV fluids now with worsening AKI.     Assessment and Plan: * AKI (acute kidney injury) ([place]) Hypernatremia Hyperkalemia Metabolic acidosis Acute metabolic encephalopathy Given the patient's poor response to fluids, his persistent delirium, persistent hypernatremia and renal insufficiency and his poor oral intake, I suspect that the patient is entering a terminal decline.    Discussed with wife and daughter today, they feel he would not want to be kept in restraints, and hooked to IVs indefinitely with diminishing likelihood of improvement. - Stop IV fluids - Consult Hospice   BPH (benign prostatic hyperplasia) - Continue Flomax  Dementia with behavioral disturbance (HCC) - Continue memantine   - Continue memantine, Seroquel for now  Protein-calorie malnutrition, severe - Continue feeding supplements  Chronic combined systolic and diastolic heart failure (HCC) Appears dehydrated - Hold diuretics, hold Entresto  Paroxysmal atrial fibrillation (HCC) - Continue apixaban, metoprolol  Non-insulin dependent type 2 diabetes mellitus (HCC) Glucose normal - Continue SS corrections  Essential hypertension BP controlled - Continue metoprolol          Subjective: Patient restless, pulling at meds, unable to engage in meaningful discussion     Physical Exam: Vitals:   09/26/21 2036 09/27/21 0621 09/27/21 0629 09/27/21 0819  BP: (!) 147/57 125/81  136/85   Pulse: 92 86  85  Resp: 18 18  17   Temp: (!) 97.5 F (36.4 C) (!) 97.4 F (36.3 C)  (!) 97.5 F (36.4 C)  TempSrc: Oral Oral  Oral  SpO2: 100% (!) 75% 100%   Weight:   77.9 kg   Height:       Elderly adult male, lying in bed, restless Tachycardic, soft systolic murmur, no peripheral edema Respiratory normal, lungs clear without rales or wheezes Severe diffuse loss of subcutaneous muscle mass and fat Attention diminished, affect blunted, judgment insight appear impaired     Disposition: Status is: Inpatient         Author: Edwin Dada, MD 09/27/2021 3:29 PM  For on call review [email or web address].

## 2021-09-27 NOTE — TOC Progression Note (Addendum)
Transition of Care [facility]) - Progression Note    Patient Details  Name: Ricky Barnett MRN: [id number] Date of Birth: 10/19/1940  Transition of Care [facility]) CM/SW [place], RN Phone Number: 09/27/2021, 2:00 PM  Clinical Narrative:      [name] had dis cusion with family today. He is going to be going to [facility] when bed available as he meets crit eria for residential. Expected Discharge Plan: [place] Barriers to Discharge: Continued Medical Work up, SNF Pending bed offer, Ship broker  Expected Discharge Plan and Services Expected Discharge Plan: [place]: [place] arrangements for the past 2 months: Single Family Home                                       Social Determinants of Health (SDOH) Interventions    Readmission Risk Interventions     No data to display

## 2021-09-27 NOTE — Progress Notes (Signed)
PT Cancellation Note  Patient Details Name: Ricky Barnett MRN: [id number] DOB: 11/12/1940   Cancelled Treatment:    Reason Eval/Treat Not Completed: Other (comment) Nursing asked therapy to hold due to patient expected to go to comfort measures.   Gypsy Kellogg A. Gilford Rile PT, DPT Acute Rehabilitation Services Office [phone]    Linna Hoff 09/27/2021, 2:28 PM

## 2021-09-27 NOTE — Progress Notes (Signed)
Manufacturing engineer [facility]) Hospital Liaison Note  Referral received for patient/family interest in [facility].   Bedside assessment will take place tomorrow for eligibility.  Please call with any questions or concerns. Thank you  Roselee Nova, [place] Hospital Liaison [phone]

## 2021-09-27 NOTE — Progress Notes (Signed)
OT Cancellation Note  Patient Details Name: Ricky Barnett MRN: [id number] DOB: 1940-09-25   Cancelled Treatment:    Reason Eval/Treat Not Completed: Patient not medically ready (Nursing asked therapy to hold treatment due to patient expected to go to comfort measures.) Lodema Hong, [place]  Office [place] 09/27/2021, 1:54 PM

## 2021-09-27 NOTE — Progress Notes (Addendum)
Palliative:   Chart review completed.  Phone conference with attending. Mr. Bloyd is lying quietly in bed.  He appears acutely/chronically ill and somewhat frail.  He will make but not keep eye contact.  He has known dementia.  Present today at bedside is attending, Dr. Loleta Books, wife since 1995, Pamala Hurry, and daughter, Lorriane Shire visiting from [place].  Pamala Hurry shares that Mr. Gladd has not improved over the last few days.  She shares that he has periods where she thinks he is doing okay, but then at times is very difficult to redirect, wanting to remove IVs remove mitts, has confusion.  We talk about his acute illness with no meaningful improvement over the last week plus.  We talk about what this time will look like and feel like for Mr. Fournet.  We talk about unburden him/untying him from IV fluids, allowing him to eat and drink what he will naturally.  We talk about his poor by mouth intake over the last few months and his weight loss.  We talked about expected changes that happen with nutrition and dementia.  Pamala Hurry and Lorriane Shire tearfully share the changes that they have seen in Mr. Kirsh over the last 6+ months, in particular since his son died in Jan 31, 2023.  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  Mrs. Kantz shares that he was having periods of confusion at home, removing his adult diaper in the kitchen, periods of tearfulness and anger.  She also shared on a previous visit that he was sundowning.  We talk about hospice care.  We talk about what is and is not provided in hospice care.  Lorriane Shire seems knowledgeable.  She shares that her mother died in the last few years with dementia also.  We talk about comfort and dignity, letting nature take its course.  Family is tearful, but endorses that they cannot change what is happening for Mr. Behrens.  They continue to share his declines both mental, physical, and nutritional.    Conference with daughter, Lorriane Shire, in the  hallway.  Prognosis discussed with permission.  Family states that Mr. Crofford is tearful, stating that he is "ready to go".  Conference with attending, bedside nursing staff, transition of care team, local hospice liaison, related to patient condition, needs, goals of care, residential hospice referral.  Plan:    Stop IV fluids.  No CPR or intubation.  Requesting residential hospice referral with [facility] 7/29. Prognosis: 2 weeks or less would be anticipated based on poor by mouth intake, decreasing functional status, weight loss, anticipated continued decline in kidney function without IV hydration.   33 minutes  Quinn Axe, NP Palliative medicine team Team phone [phone] Greater than 50% of this time was spent counseling and coordinating care related to the above assessment and plan.

## 2021-09-28 DIAGNOSIS — Z7189 Other specified counseling: Secondary | ICD-10-CM | POA: Diagnosis not present

## 2021-09-28 DIAGNOSIS — Z515 Encounter for palliative care: Secondary | ICD-10-CM | POA: Diagnosis not present

## 2021-09-28 DIAGNOSIS — I5042 Chronic combined systolic (congestive) and diastolic (congestive) heart failure: Secondary | ICD-10-CM | POA: Diagnosis not present

## 2021-09-28 DIAGNOSIS — N179 Acute kidney failure, unspecified: Secondary | ICD-10-CM | POA: Diagnosis not present

## 2021-09-28 DIAGNOSIS — N189 Chronic kidney disease, unspecified: Secondary | ICD-10-CM | POA: Diagnosis not present

## 2021-09-28 DIAGNOSIS — F03918 Unspecified dementia, unspecified severity, with other behavioral disturbance: Secondary | ICD-10-CM | POA: Diagnosis not present

## 2021-09-28 DIAGNOSIS — N184 Chronic kidney disease, stage 4 (severe): Secondary | ICD-10-CM | POA: Diagnosis not present

## 2021-09-28 MED ORDER — BIOTENE DRY MOUTH MT LIQD: 15.0000 mL | OROMUCOSAL | Status: DC | PRN

## 2021-09-28 MED ORDER — HALOPERIDOL 0.5 MG PO TABS: 0.5000 mg | ORAL_TABLET | ORAL | Status: DC | PRN

## 2021-09-28 MED ORDER — MORPHINE SULFATE (CONCENTRATE) 10 MG/0.5ML PO SOLN
5.0000 mg | ORAL | Status: DC | PRN
  Administered 2021-09-28 – 2021-09-29 (×4): 5 mg via SUBLINGUAL
  Filled 2021-09-28 (×4): qty 0.5

## 2021-09-28 MED ORDER — ONDANSETRON HCL 4 MG/2ML IJ SOLN: 4.0000 mg | Freq: Four times a day (QID) | INTRAMUSCULAR | Status: DC | PRN

## 2021-09-28 MED ORDER — LORAZEPAM 2 MG/ML PO CONC
1.0000 mg | ORAL | Status: DC | PRN
  Administered 2021-09-28 (×2): 1 mg via SUBLINGUAL
  Filled 2021-09-28 (×2): qty 1

## 2021-09-28 MED ORDER — MORPHINE SULFATE (CONCENTRATE) 10 MG/0.5ML PO SOLN: 5.0000 mg | ORAL | Status: DC | PRN

## 2021-09-28 MED ORDER — HALOPERIDOL LACTATE 5 MG/ML IJ SOLN: 0.5000 mg | INTRAMUSCULAR | Status: DC | PRN

## 2021-09-28 MED ORDER — GLYCOPYRROLATE 1 MG PO TABS: 1.0000 mg | ORAL_TABLET | ORAL | Status: DC | PRN

## 2021-09-28 MED ORDER — POLYVINYL ALCOHOL 1.4 % OP SOLN: 1.0000 [drp] | Freq: Four times a day (QID) | OPHTHALMIC | Status: DC | PRN

## 2021-09-28 MED ORDER — ACETAMINOPHEN 650 MG RE SUPP: 650.0000 mg | Freq: Four times a day (QID) | RECTAL | Status: DC | PRN

## 2021-09-28 MED ORDER — HALOPERIDOL LACTATE 2 MG/ML PO CONC: 0.5000 mg | ORAL | Status: DC | PRN

## 2021-09-28 MED ORDER — ONDANSETRON 4 MG PO TBDP: 4.0000 mg | ORAL_TABLET | Freq: Four times a day (QID) | ORAL | Status: DC | PRN

## 2021-09-28 MED ORDER — ACETAMINOPHEN 325 MG PO TABS: 650.0000 mg | ORAL_TABLET | Freq: Four times a day (QID) | ORAL | Status: DC | PRN

## 2021-09-28 MED ORDER — LORAZEPAM 2 MG/ML IJ SOLN: 1.0000 mg | INTRAMUSCULAR | Status: DC | PRN

## 2021-09-28 MED ORDER — GLYCOPYRROLATE 0.2 MG/ML IJ SOLN: 0.2000 mg | INTRAMUSCULAR | Status: DC | PRN

## 2021-09-28 MED ORDER — LORAZEPAM 1 MG PO TABS: 1.0000 mg | ORAL_TABLET | ORAL | Status: DC | PRN

## 2021-09-28 NOTE — Progress Notes (Signed)
Palliative:    [name] is lying quietly in bed.  He appears acutely/chronically ill and very frail.  He has known dementia, and can mumble words but his responses are not clear.  He does have periods of time where he is easily understood.  I do not know if he can make his basic needs known.  His wife Ricky Barnett and his brother-in-law, Ricky Barnett, are present at bedside.  We talk about Ricky Barnett decline over the last 10 days.  We talk about his kidney injury, liver dysfunction.  We talk about his poor by mouth intake.  At this point, family is requesting comfort and dignity, residential hospice at [facility].  We talk about prognosis with permission.  I share that only God knows, but he is given this experience and training.  Anticipated 5 to 14 days.  We talk about hospice evaluation sometime this morning.  We talk about transfer to facility when a bed is available, that he would not transfer if he were too unstable.  Questions answered.  Conference with attending, bedside nursing staff, transition of care team, local hospice representative related to patient condition, needs, goals of care, disposition. End-of-life order set implemented.  Plan:   Family is requesting comfort and dignity at end-of-life, residential hospice at [place]. Prognosis:   2 weeks or less is expected based on chronic illness burden, poor by mouth intake, worsening kidney and liver function.  Prognosis discussed with family with permission. DNR/goldenrod form completed and placed on chart.  38 minutes Quinn Axe, NP Palliative medicine team Team phone [phone] Greater than 50% of this time was spent counseling and coordinating care related to the above assessment and plan.

## 2021-09-28 NOTE — Progress Notes (Signed)
  Progress Note   Patient: Ricky Barnett DGL:[id number] DOB: [birth year] DOA: 09/17/2021     10 DOS: the patient was seen and examined on 09/28/2021        Brief hospital course: Ricky Barnett is a 81 y.o. male with a history of CKD stage IIIb, dementia, diabetes mellitus, hypertension and atrial fibrillation s/p pacemaker placement. Patient presented secondary to decreased appetite and combativeness in setting of dementia. Patient noted to have evidence of AKI and possible shock liver. Patient has been managed on IV fluids now with worsening AKI.     Assessment and Plan: * AKI (acute kidney injury) ([place]) Hypernatremia Hyperkalemia Metabolic acidosis Acute metabolic encephalopathy Patient's mentation and function appear worse today.  He has taken nothing by mouth, he mostly has his eyes closed.  He is restless but not agitated.  - Full comfort care - Morphine for pain, Ativan for anxiety - Consult Hospice   BPH (benign prostatic hyperplasia) - Continue Flomax  Dementia with behavioral disturbance (HCC) - Stop memantine, Seroquel   Protein-calorie malnutrition, severe - Food for comfort  Chronic combined systolic and diastolic heart failure (HCC) - Stop diuretics, Entresto  Paroxysmal atrial fibrillation (HCC) - Stop apixaban, metoprolol  Non-insulin dependent type 2 diabetes mellitus ([place]) - Stop glucose checks  Essential hypertension Vitals on family request - Stop metoprolol          Subjective: Patient appears more weak, tired, does not engage.     Physical Exam: Vitals:   09/27/21 1624 09/27/21 2009 09/28/21 0515 09/28/21 0825  BP: 128/64 (!) 128/108 134/67 107/72  Pulse: 87 87 93 (!) 43  Resp: 17 19 (!) 22 16  Temp:  97.9 F (36.6 C) 97.9 F (36.6 C) (!) 97.5 F (36.4 C)  TempSrc:  Oral Axillary Oral  SpO2:  96% 100% 100%  Weight:      Height:       Elderly adult male, lying in bed, restless Tachycardic, no murmur, no peripheral  edema Respiratory rate normal, lungs clear without rales or wheezes Severe loss of subcutaneous muscle mass and fat Attention diminished, does not open eyes, mumbles incoherently to touch, no purposeful movements    Disposition: Status is: Inpatient         Author: Edwin Dada, MD 09/28/2021 4:50 PM  For on call review [email or web address].

## 2021-09-29 DIAGNOSIS — N179 Acute kidney failure, unspecified: Secondary | ICD-10-CM | POA: Diagnosis not present

## 2021-09-29 MED ORDER — BIOTENE DRY MOUTH MT LIQD: 15.0000 mL | OROMUCOSAL | Status: AC | PRN

## 2021-09-29 MED ORDER — ONDANSETRON 4 MG PO TBDP: 4.0000 mg | ORAL_TABLET | Freq: Four times a day (QID) | ORAL | 0 refills | Status: AC | PRN

## 2021-09-29 MED ORDER — HALOPERIDOL 0.5 MG PO TABS: 0.5000 mg | ORAL_TABLET | ORAL | Status: AC | PRN

## 2021-09-29 MED ORDER — LORAZEPAM 1 MG PO TABS: 1.0000 mg | ORAL_TABLET | ORAL | 0 refills | Status: AC | PRN

## 2021-09-29 MED ORDER — ACETAMINOPHEN 325 MG PO TABS: 650.0000 mg | ORAL_TABLET | Freq: Four times a day (QID) | ORAL | Status: AC | PRN

## 2021-09-29 MED ORDER — GLYCOPYRROLATE 1 MG PO TABS: 1.0000 mg | ORAL_TABLET | ORAL | Status: AC | PRN

## 2021-09-29 MED ORDER — HYDROCERIN EX CREA: 1.0000 | TOPICAL_CREAM | Freq: Two times a day (BID) | CUTANEOUS | 0 refills | Status: AC

## 2021-09-29 MED ORDER — POLYVINYL ALCOHOL 1.4 % OP SOLN
1.0000 [drp] | Freq: Four times a day (QID) | OPHTHALMIC | 0 refills | Status: AC | PRN
Start: 2021-09-29 — End: ?

## 2021-09-29 MED ORDER — MORPHINE SULFATE (CONCENTRATE) 10 MG/0.5ML PO SOLN: 5.0000 mg | ORAL | 0 refills | Status: AC | PRN

## 2021-09-29 NOTE — Progress Notes (Signed)
Mobility Specialist Progress Note:   09/29/21 1300  Mobility  Activity Turned to left side;Turned to right side  Level of Assistance Moderate assist, patient does 50-74%  Assistive Device None  Activity Response Tolerated well  $Mobility charge 1 Mobility   NT requesting assistance with changed bed pads. Pt required modA to turn to both sides. Tolerated well. Left with all needs met, family members present.   Nelta Numbers Acute Rehab Secure Chat or Office Phone: [phone]

## 2021-09-29 NOTE — Discharge Summary (Signed)
Physician Discharge Summary   Patient: Ricky Barnett MRN: [id number] DOB: 12-31-1940  Admit date:     09/17/2021  Discharge date: 09/29/21  Discharge Physician: Edwin Dada   PCP: Merrilee Seashore, MD      Discharge Diagnoses: Principal Problem:   AKI (acute kidney injury) ([place]) Active Problems:   Elevated LFTs due to shock liver   End stage chronic combined systolic and diastolic heart failure (HCC)   Metabolic acidosis   Acute metabolic encephalopathy   Failure to thrive in adult   Hyperkalemia   Hypernatremia   Essential hypertension   Non-insulin dependent type 2 diabetes mellitus ([place])   Pacemaker   Paroxysmal atrial fibrillation (HCC)   CKD (chronic kidney disease), stage IV ([place])   Anemia due to chronic kidney disease   Dysphagia   Protein-calorie malnutrition, severe   Dementia with behavioral disturbance (HCC)   BPH (benign prostatic hyperplasia)      Hospital Course: Ricky Barnett is an 81 y.o. M with CKD IV, dementia, home-dwelling, sCHF EF 20%, DM, HTN, AF on Eliquis with PPM who presented for decreased oral intake, combativeness and increasing confusion.    In the ER, found to have renal failure, appeared dehydrated although BNP >4000 and marked elevation in liver function tests.      * AKI (acute kidney injury) and shock liver likely due to end stage heart failure Hypernatremia Hyperkalemia Metabolic acidosis Acute metabolic encephalopathy Failure to thrive Patient was admitted and initially started on fluids and his creatinine and LFTs improved slightly, but did not resolve and so Nephrology were consulted.  Despite appropriate therapy for renal failure, electrolyte abnormality, and return of these indices to near his baseline, the patient's mentation, function and oral intake declined.  He became persistently disoriented and agitated, and appeared to be failing to respond to therapy and in terminal decline.  Family present at the bedside  knew that he would not want to be kept perpetuated by artificial means, and so IV fluids were stopped, his oral intake was minimal, and his alertness waned.    Hospice were consulted, and he was transferred to [facility].     CKD (chronic kidney disease), stage IV (HCC) Baseline Cr 2.2 and proteinuria.  CKD IIIb documented, which is ruled out.  GFR at baseline appears to be <30.  Chronic combined systolic and diastolic heart failure (HCC) BNP elevated on admission, but appeared cold and dry.  Not a candidate for inotropes.  Likely end stage heart failure Entresto and diuretics held.  Clinically deteriorated as above.                 The [facility] Controlled Substances Registry was reviewed for this patient prior to discharge.   Consultants: Nephrology, Palliative Care Disposition: Hospice care   DISCHARGE MEDICATION: Allergies as of 09/29/2021   No Known Allergies      Medication List     STOP taking these medications    apixaban 2.5 MG Tabs tablet Commonly known as: ELIQUIS   Entresto 97-103 MG Generic drug: sacubitril-valsartan   memantine 5 MG tablet Commonly known as: NAMENDA   metFORMIN 500 MG 24 hr tablet Commonly known as: GLUCOPHAGE-XR   metoprolol succinate 50 MG 24 hr tablet Commonly known as: TOPROL-XL   multivitamin tablet   pravastatin 40 MG tablet Commonly known as: PRAVACHOL   traMADol 50 MG tablet Commonly known as: ULTRAM       TAKE these medications    acetaminophen 325 MG tablet  Commonly known as: TYLENOL Take 2 tablets (650 mg total) by mouth every 6 (six) hours as needed for mild pain (or Fever >/= 101). What changed:  medication strength how much to take when to take this reasons to take this   antiseptic oral rinse Liqd Apply 15 mLs topically as needed for dry mouth.   glycopyrrolate 1 MG tablet Commonly known as: ROBINUL Take 1 tablet (1 mg total) by mouth every 4 (four) hours as needed (excessive  secretions).   haloperidol 0.5 MG tablet Commonly known as: HALDOL Take 1 tablet (0.5 mg total) by mouth every 4 (four) hours as needed for agitation (or delirium).   hydrocerin Crea Apply 1 Application topically 2 (two) times daily.   LORazepam 1 MG tablet Commonly known as: ATIVAN Take 1 tablet (1 mg total) by mouth every 4 (four) hours as needed for anxiety.   morphine CONCENTRATE 10 MG/0.5ML Soln concentrated solution Take 0.25 mLs (5 mg total) by mouth every 2 (two) hours as needed for moderate pain (or dyspnea).   ondansetron 4 MG disintegrating tablet Commonly known as: ZOFRAN-ODT Take 1 tablet (4 mg total) by mouth every 6 (six) hours as needed for nausea.   polyvinyl alcohol 1.4 % ophthalmic solution Commonly known as: LIQUIFILM TEARS Place 1 drop into both eyes 4 (four) times daily as needed for dry eyes.   tamsulosin 0.4 MG Caps capsule Commonly known as: FLOMAX Take 0.4 mg by mouth daily after supper.           Discharge Exam: Filed Weights   09/25/21 0500 09/26/21 0416 09/27/21 0629  Weight: 77.7 kg 74.3 kg 77.9 kg    General: Pt is awake but not alert, eyes mostly closed, sluggish  Cardiovascular: Tachycardic, gallop noted, no LE edema, no pitting, no JVD  Respiratory: Shallow, weak.  No rales or wheezes appreciated. Abdominal: Abdomen soft and non-tender.  No distension or HSM.   Neuro/Psych: Severe generalized weakness, mentation poor, does not respond to questions consistently, does not follow commands.   Condition at discharge: worsening  The results of significant diagnostics from this hospitalization (including imaging, microbiology, ancillary and laboratory) are listed below for reference.   Imaging Studies: DG CHEST PORT 1 VIEW  Result Date: 09/26/2021 CLINICAL DATA:  Leukocytosis EXAM: PORTABLE CHEST 1 VIEW COMPARISON:  01/16/2018 FINDINGS: Left-sided multi lead pacing device. Cardiomegaly with vascular congestion. Suspect small  right-sided effusion. Hazy atelectasis at the bases. No pneumothorax IMPRESSION: Cardiomegaly with vascular congestion and suspected small right effusion. Hazy atelectasis at the lung bases. Electronically Signed   By: Donavan Foil M.D.   On: 09/26/2021 17:35   US RENAL  Result Date: 09/21/2021 CLINICAL DATA:  Acute kidney injury. EXAM: RENAL / URINARY TRACT ULTRASOUND COMPLETE COMPARISON:  None Available. FINDINGS: Right Kidney: Renal measurements: 11.9 x 4.6 x 4.7 = volume: 132.6 mL. Echogenic renal cortex concerning for medical renal disease. Right upper pole renal cyst measuring 3.2 x 3.3 x 2.9 cm and another smaller adjacent upper pole renal cyst measuring 2.2 x 2.1 x 1.8 cm without evidence of vascular flow, most consistent with benign cysts. Left Kidney: Renal measurements: 10.4 x 5.4 x 4.4 cm = volume: 128.1 mL. Echogenicity within normal limits. No mass or hydronephrosis visualized. Bladder: Urinary bladder is decompressed. Other: None. IMPRESSION: 1.  No evidence of nephrolithiasis or hydronephrosis. 2.  Echogenic renal cortices concerning for medical renal disease. 3.  Simple renal cysts in the upper pole of the right kidney. Electronically Signed   By:  Imran  Ahmed D.O.   On: 09/21/2021 20:35   CT ABDOMEN PELVIS WO CONTRAST  Result Date: 09/18/2021 CLINICAL DATA:  Elevated liver function tests and renal failure. EXAM: CT ABDOMEN AND PELVIS WITHOUT CONTRAST TECHNIQUE: Multidetector CT imaging of the abdomen and pelvis was performed following the standard protocol without IV contrast. RADIATION DOSE REDUCTION: This exam was performed according to the departmental dose-optimization program which includes automated exposure control, adjustment of the mA and/or kV according to patient size and/or use of iterative reconstruction technique. COMPARISON:  11/01/2013 FINDINGS: Decubitus positioning Lower chest: Right base atelectasis related to positioning. Cardiomegaly and biventricular pacer.  Hepatobiliary: No focal liver abnormality.Cholecystectomy. No bile duct dilatation Pancreas: Unremarkable. Spleen: Unremarkable. Adrenals/Urinary Tract: Negative adrenals. No hydronephrosis or stone. Faint cystic density at the upper pole right kidney. Unremarkable bladder. Stomach/Bowel: No obstruction. No bowel wall thickening or appendicitis is seen. Generalized colonic diverticulosis. Vascular/Lymphatic: No acute vascular abnormality. No mass or adenopathy. Nonspecific retroperitoneal edema. Reproductive:No pathologic findings. Other: Trace ascites in the dependent abdomen. Body wall/scrotal edema Musculoskeletal: No acute abnormalities. IMPRESSION: 1. Cardiomegaly and mild third-spacing. 2. No bowel obstruction or visible inflammation. Electronically Signed   By: Jorje Guild M.D.   On: 09/18/2021 06:31    Microbiology: Results for orders placed or performed during the hospital encounter of 09/17/21  MRSA Next Gen by PCR, Nasal     Status: None   Collection Time: 09/25/21  5:48 PM   Specimen: Nasal Mucosa; Nasal Swab  Result Value Ref Range Status   MRSA by PCR Next Gen NOT DETECTED NOT DETECTED Final    Comment: (NOTE) The GeneXpert MRSA Assay (FDA approved for NASAL specimens only), is one component of a comprehensive MRSA colonization surveillance program. It is not intended to diagnose MRSA infection nor to guide or monitor treatment for MRSA infections. Test performance is not FDA approved in patients less than 83 years old. Performed at [facility], [place] [address], [place], [place] [address]   Culture, blood (Routine X 2) w Reflex to ID Panel     Status: None (Preliminary result)   Collection Time: 09/26/21  7:21 PM   Specimen: BLOOD  Result Value Ref Range Status   Specimen Description BLOOD LEFT ANTECUBITAL  Final   Special Requests IN PEDIATRIC BOTTLE Blood Culture adequate volume  Final   Culture   Final    NO GROWTH 3 DAYS Performed at [facility], [place] [address], [place], [place] [address]    Report Status PENDING  Incomplete    Labs: CBC: Recent Labs  Lab 09/25/21 1021 09/26/21 0323 09/27/21 0421  WBC 9.3 18.7* 13.2*  HGB 10.2* 10.0* 10.3*  HCT 32.2* 31.9* 32.3*  MCV 84.5 85.8 83.5  PLT 257 281 010   Basic Metabolic Panel: Recent Labs  Lab 09/23/21 0115 09/24/21 0254 09/25/21 1021 09/26/21 0323 09/27/21 0704  NA 146* 145 149* 148* 148*  K 6.2* 3.8 4.5 4.0 4.7  CL 118* 114* 120* 119* 118*  CO2 21* 23 20* 22 23  GLUCOSE 135* 127* 136* 125* 126*  BUN 81* 65* 59* 54* 53*  CREATININE 3.24* 2.20* 2.35* 1.99* 2.05*  CALCIUM 8.5* 8.3* 8.6* 8.5* 8.6*  PHOS  --   --  3.2  --   --    Liver Function Tests: Recent Labs  Lab 09/23/21 0115 09/24/21 0254 09/25/21 1021 09/27/21 0704  AST 97* 60*  --  37  ALT 389* 311*  --  169*  ALKPHOS  153* 144*  --  116  BILITOT 2.2* 1.5*  --  1.3*  PROT 5.5* 5.6*  --  5.6*  ALBUMIN 2.6* 2.8* 2.9* 2.7*   CBG: Recent Labs  Lab 09/26/21 2049 09/27/21 0824 09/27/21 1147 09/27/21 1627 09/27/21 2200  GLUCAP 144* 129* 112* 124* 106*    Discharge time spent: approximately 35 minutes spent on discharge counseling, evaluation of patient on day of discharge, and coordination of discharge planning with nursing, social work, pharmacy and case management  Signed: Edwin Dada, MD [facility] 09/29/2021

## 2021-09-29 NOTE — Progress Notes (Signed)
Manufacturing engineer [facility]) Hospital Liaison Note  Bed offered and accepted for transfer to [facility] place today. Unit RN please call report to [phone] prior to patient leaving the unit. Please send signed DNR and paperwork with patient.   Please leave all IV access and ports in place.   Call with any questions or concerns. Thank you  Roselee Nova, [place] Hospital Liaison [phone]

## 2021-09-29 NOTE — TOC Progression Note (Signed)
Transition of Care (TOC) - Progression Note  Pt for transfer today to [facility]. Pt's wife agreeable to plans. Plan transfer via EMS/PTAR.   Patient Details  Name: Ricky Barnett MRN: [id number] Date of Birth: 05/21/1940  Transition of Care [facility]) CM/SW Contact  Marcelline Deist Donavan Foil, [place] Phone Number: 09/29/2021, 3:21 PM  Clinical Narrative:       Expected Discharge Plan: [place] Barriers to Discharge: No Barriers Identified  Expected Discharge Plan and Services Expected Discharge Plan: [place] Choice: [place] Living arrangements for the past 2 months: Single Family Home Expected Discharge Date: 09/29/21                           [facility] Agency: Hospice and [facility]         Social Determinants of Health (SDOH) Interventions    Readmission Risk Interventions     No data to display

## 2021-09-29 NOTE — Progress Notes (Signed)
Called [phone] about patient's pacemaker/ICD. At present, patient is on route to [facility] via so I gave them the contact number to call [facility]

## 2021-09-29 NOTE — Plan of Care (Signed)
  Problem: Pain Managment: Goal: General experience of comfort will improve Outcome: Progressing   Problem: Safety: Goal: Ability to remain free from injury will improve Outcome: Progressing   Problem: Nutrition: Goal: Adequate nutrition will be maintained Outcome: Not Progressing   

## 2021-10-01 LAB — CULTURE, BLOOD (ROUTINE X 2)
Culture: NO GROWTH
Special Requests: ADEQUATE

## 2021-11-01 DEATH — deceased
